# Patient Record
Sex: Female | Born: 1985 | Race: White | Hispanic: No | Marital: Married | Smoking: Current every day smoker
Health system: Southern US, Community
[De-identification: ages and names within clinical notes are randomized; demographics above are authoritative.]

## PROBLEM LIST (undated history)

## (undated) DIAGNOSIS — M797 Fibromyalgia: Secondary | ICD-10-CM

## (undated) DIAGNOSIS — N281 Cyst of kidney, acquired: Secondary | ICD-10-CM

## (undated) DIAGNOSIS — I10 Essential (primary) hypertension: Secondary | ICD-10-CM

## (undated) DIAGNOSIS — R51 Headache: Secondary | ICD-10-CM

## (undated) DIAGNOSIS — K219 Gastro-esophageal reflux disease without esophagitis: Secondary | ICD-10-CM

## (undated) DIAGNOSIS — R319 Hematuria, unspecified: Secondary | ICD-10-CM

## (undated) DIAGNOSIS — L732 Hidradenitis suppurativa: Principal | ICD-10-CM

## (undated) DIAGNOSIS — M1990 Unspecified osteoarthritis, unspecified site: Secondary | ICD-10-CM

## (undated) HISTORY — PX: TONSILLECTOMY: SUR1361

## (undated) HISTORY — DX: Hidradenitis suppurativa: L73.2

---
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
## 2005-01-05 HISTORY — PX: EAR CYST EXCISION: SHX22

## 2005-03-24 ENCOUNTER — Emergency Department (HOSPITAL_COMMUNITY): Admission: EM | Admit: 2005-03-24 | Discharge: 2005-03-24 | Payer: Self-pay | Admitting: Emergency Medicine

## 2005-07-07 ENCOUNTER — Ambulatory Visit: Payer: Self-pay | Admitting: Family Medicine

## 2006-05-05 ENCOUNTER — Encounter: Admission: RE | Admit: 2006-05-05 | Discharge: 2006-05-05 | Payer: Self-pay | Admitting: Family Medicine

## 2006-08-09 ENCOUNTER — Inpatient Hospital Stay (HOSPITAL_COMMUNITY): Admission: AD | Admit: 2006-08-09 | Discharge: 2006-08-09 | Payer: Self-pay | Admitting: Gynecology

## 2006-08-10 ENCOUNTER — Inpatient Hospital Stay (HOSPITAL_COMMUNITY): Admission: AD | Admit: 2006-08-10 | Discharge: 2006-08-10 | Payer: Self-pay | Admitting: Obstetrics and Gynecology

## 2006-08-24 ENCOUNTER — Ambulatory Visit (HOSPITAL_COMMUNITY): Admission: AD | Admit: 2006-08-24 | Discharge: 2006-08-25 | Payer: Self-pay | Admitting: Obstetrics & Gynecology

## 2006-08-24 ENCOUNTER — Encounter: Payer: Self-pay | Admitting: Obstetrics & Gynecology

## 2006-08-25 HISTORY — PX: SALPINGECTOMY: SHX328

## 2007-01-06 HISTORY — PX: TUMOR REMOVAL: SHX12

## 2007-11-06 ENCOUNTER — Inpatient Hospital Stay (HOSPITAL_COMMUNITY): Admission: AD | Admit: 2007-11-06 | Discharge: 2007-11-06 | Payer: Self-pay | Admitting: Obstetrics & Gynecology

## 2007-12-13 ENCOUNTER — Ambulatory Visit (HOSPITAL_COMMUNITY): Admission: RE | Admit: 2007-12-13 | Discharge: 2007-12-13 | Payer: Self-pay | Admitting: Obstetrics & Gynecology

## 2008-01-06 HISTORY — PX: TUMOR REMOVAL: SHX12

## 2008-01-09 ENCOUNTER — Ambulatory Visit (HOSPITAL_COMMUNITY): Admission: RE | Admit: 2008-01-09 | Discharge: 2008-01-09 | Payer: Self-pay | Admitting: Obstetrics & Gynecology

## 2008-01-20 ENCOUNTER — Ambulatory Visit (HOSPITAL_COMMUNITY): Admission: RE | Admit: 2008-01-20 | Discharge: 2008-01-20 | Payer: Self-pay | Admitting: Obstetrics & Gynecology

## 2008-01-30 ENCOUNTER — Inpatient Hospital Stay (HOSPITAL_COMMUNITY): Admission: AD | Admit: 2008-01-30 | Discharge: 2008-01-30 | Payer: Self-pay | Admitting: Obstetrics

## 2008-02-08 ENCOUNTER — Encounter: Admission: RE | Admit: 2008-02-08 | Discharge: 2008-05-08 | Payer: Self-pay | Admitting: Obstetrics & Gynecology

## 2008-03-02 ENCOUNTER — Ambulatory Visit (HOSPITAL_COMMUNITY): Admission: RE | Admit: 2008-03-02 | Discharge: 2008-03-02 | Payer: Self-pay | Admitting: Obstetrics & Gynecology

## 2008-04-13 ENCOUNTER — Ambulatory Visit (HOSPITAL_COMMUNITY): Admission: RE | Admit: 2008-04-13 | Discharge: 2008-04-13 | Payer: Self-pay | Admitting: Obstetrics & Gynecology

## 2008-05-02 ENCOUNTER — Inpatient Hospital Stay (HOSPITAL_COMMUNITY): Admission: AD | Admit: 2008-05-02 | Discharge: 2008-05-02 | Payer: Self-pay | Admitting: Obstetrics

## 2008-05-11 ENCOUNTER — Ambulatory Visit (HOSPITAL_COMMUNITY): Admission: RE | Admit: 2008-05-11 | Discharge: 2008-05-11 | Payer: Self-pay | Admitting: Obstetrics & Gynecology

## 2008-05-17 ENCOUNTER — Ambulatory Visit (HOSPITAL_COMMUNITY): Admission: RE | Admit: 2008-05-17 | Discharge: 2008-05-17 | Payer: Self-pay | Admitting: Obstetrics & Gynecology

## 2008-05-31 ENCOUNTER — Ambulatory Visit (HOSPITAL_COMMUNITY): Admission: RE | Admit: 2008-05-31 | Discharge: 2008-05-31 | Payer: Self-pay | Admitting: Obstetrics & Gynecology

## 2008-06-07 ENCOUNTER — Ambulatory Visit (HOSPITAL_COMMUNITY): Admission: RE | Admit: 2008-06-07 | Discharge: 2008-06-07 | Payer: Self-pay | Admitting: Obstetrics & Gynecology

## 2008-06-12 ENCOUNTER — Inpatient Hospital Stay (HOSPITAL_COMMUNITY): Admission: AD | Admit: 2008-06-12 | Discharge: 2008-06-12 | Payer: Self-pay | Admitting: Obstetrics & Gynecology

## 2008-06-14 ENCOUNTER — Inpatient Hospital Stay (HOSPITAL_COMMUNITY): Admission: AD | Admit: 2008-06-14 | Discharge: 2008-06-15 | Payer: Self-pay | Admitting: Obstetrics & Gynecology

## 2008-10-25 ENCOUNTER — Emergency Department (HOSPITAL_COMMUNITY): Admission: EM | Admit: 2008-10-25 | Discharge: 2008-10-25 | Payer: Self-pay | Admitting: Family Medicine

## 2009-01-05 HISTORY — PX: TUMOR REMOVAL: SHX12

## 2009-03-01 ENCOUNTER — Ambulatory Visit (HOSPITAL_BASED_OUTPATIENT_CLINIC_OR_DEPARTMENT_OTHER): Admission: RE | Admit: 2009-03-01 | Discharge: 2009-03-01 | Payer: Self-pay | Admitting: Surgery

## 2009-03-01 HISTORY — PX: IRRIGATION AND DEBRIDEMENT SEBACEOUS CYST: SHX5255

## 2009-03-08 ENCOUNTER — Encounter: Admission: RE | Admit: 2009-03-08 | Discharge: 2009-03-08 | Payer: Self-pay | Admitting: Podiatry

## 2009-11-20 ENCOUNTER — Encounter: Payer: Self-pay | Admitting: Cardiology

## 2009-11-21 ENCOUNTER — Encounter: Payer: Self-pay | Admitting: Cardiology

## 2009-11-22 DIAGNOSIS — F329 Major depressive disorder, single episode, unspecified: Secondary | ICD-10-CM

## 2009-11-22 DIAGNOSIS — R450 Nervousness: Secondary | ICD-10-CM | POA: Insufficient documentation

## 2009-11-22 DIAGNOSIS — R42 Dizziness and giddiness: Secondary | ICD-10-CM

## 2009-11-22 DIAGNOSIS — R55 Syncope and collapse: Secondary | ICD-10-CM

## 2009-11-22 DIAGNOSIS — K219 Gastro-esophageal reflux disease without esophagitis: Secondary | ICD-10-CM

## 2009-11-22 DIAGNOSIS — R002 Palpitations: Secondary | ICD-10-CM

## 2009-11-22 DIAGNOSIS — R112 Nausea with vomiting, unspecified: Secondary | ICD-10-CM | POA: Insufficient documentation

## 2009-11-22 DIAGNOSIS — R011 Cardiac murmur, unspecified: Secondary | ICD-10-CM

## 2009-11-22 DIAGNOSIS — O200 Threatened abortion: Secondary | ICD-10-CM | POA: Insufficient documentation

## 2009-11-27 ENCOUNTER — Ambulatory Visit: Payer: Self-pay | Admitting: Cardiology

## 2009-11-27 ENCOUNTER — Encounter: Payer: Self-pay | Admitting: Cardiology

## 2009-12-05 LAB — CONVERTED CEMR LAB
BUN: 8 mg/dL (ref 6–23)
Calcium: 9.4 mg/dL (ref 8.4–10.5)
Glucose, Bld: 49 mg/dL — ABNORMAL LOW (ref 70–99)
Magnesium: 2 mg/dL (ref 1.5–2.5)
Sodium: 140 meq/L (ref 135–145)

## 2009-12-11 ENCOUNTER — Inpatient Hospital Stay (HOSPITAL_COMMUNITY)
Admission: AD | Admit: 2009-12-11 | Discharge: 2009-12-12 | Payer: Self-pay | Source: Home / Self Care | Admitting: Obstetrics & Gynecology

## 2010-01-26 ENCOUNTER — Encounter: Payer: Self-pay | Admitting: Family Medicine

## 2010-01-27 ENCOUNTER — Encounter: Payer: Self-pay | Admitting: Obstetrics & Gynecology

## 2010-02-02 ENCOUNTER — Inpatient Hospital Stay (HOSPITAL_COMMUNITY)
Admission: AD | Admit: 2010-02-02 | Discharge: 2010-02-02 | Payer: Self-pay | Source: Home / Self Care | Attending: Obstetrics | Admitting: Obstetrics

## 2010-02-04 NOTE — Progress Notes (Signed)
Summary: [facility] Office Visit  [facility] Office Visit   Imported By: Earl Many 12/05/2009 15:49:57  _____________________________________________________________________  External Attachment:    Type:   Image     Comment:   External Document

## 2010-02-04 NOTE — Progress Notes (Signed)
Summary: [facility]  [facility]   Imported By: Earl Many 12/05/2009 15:47:45  _____________________________________________________________________  External Attachment:    Type:   Image     Comment:   External Document

## 2010-02-04 NOTE — Consult Note (Signed)
Summary: [facility] Consultation Report  [facility] Consultation Report   Imported By: Earl Many 12/05/2009 15:51:02  _____________________________________________________________________  External Attachment:    Type:   Image     Comment:   External Document

## 2010-02-04 NOTE — Assessment & Plan Note (Signed)
Summary: np6/dx:palpitations/early pregnacy not sure how many weeks/lg   Visit Type:  new pt visit Referring Provider:  Dr. Tamela Oddi Primary Provider:  Dr. Daphine Deutscher...[facility] Kitchen[facility]  CC:  palpitations...sob at times....chest discomfort/pressure...denies any edema...pt is [redacted] wks gestation.  History of Present Illness: [name] comes in today for evaluation of intermittent palpitations. We are asked to see her by Dr. Tamela Oddi.   She is [redacted] weeks pregnant. She is 25 years of age. She's been having intermittent palpitations that lasted for a few seconds. They occur spontaneously. They're not associated with exertion. She denies any other symptoms and denies any presyncope or syncope.  She had it evaluated by [place] in 2006. At that time, a monitor showed PACs but no other dysrhythmia. Echocardiogram was normal. She was encouraged to not smoke and to discontinue caffeine. She still smokes 3-4 cigarettes a day but does not use caffeine. At the time of this evaluation, she was pregnant as well.   She denies orthopnea, PND or edema.  Current Medications (verified): 1)  Tamiflu 45 Mg Caps (Oseltamivir Phosphate) .Marland Kitchen.. 1 Tab Two Times A Day  Allergies (verified): No Known Drug Allergies  Past History:  Past Medical History: Last updated: 11/22/2009 TRICUSPID REGURGITATION, MODERATE (ICD-397.0) PALPITATIONS (ICD-785.1) CARDIAC MURMUR (ICD-785.2) SYNCOPE AND COLLAPSE (ICD-780.2) THREATENED ABORTION, ANTEPARTUM (ICD-640.03) NERVOUSNESS (ICD-799.21) NAUSEA AND VOMITING (ICD-787.01) DIZZINESS (ICD-780.4) ACID REFLUX DISEASE (ICD-530.81) DEPRESSION (ICD-311)  Past Surgical History: Last updated: 11/22/2009 Excision of four sebaceous cysts from both groins all  measured about 1 cm.  SURGEON:  Wilmon Arms. Tsuei, MD  Laparoscopic right salpingectomy. SURGEON:  Tamela Oddi, M.D.  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  Alcohol Use - no Drug Use - no  Risk Factors: Smoking Status: quit (11/22/2009)  Review of Systems       negative other than history of present illness  Vital Signs:  Patient profile:   25 year old female Height:      67 inches Weight:      196 pounds BMI:     30.81 Pulse rate:   82 / minute Pulse rhythm:   normal BP sitting:   94 / 62  (left arm) Cuff size:   large  Vitals Entered By: Danielle Rankin, CMA (November 27, 2009 10:53 AM)  Physical Exam  General:  obese.   Head:  normocephalic and atraumatic Eyes:  PERRLA/EOM intact; conjunctiva and lids normal. Neck:  Neck supple, no JVD. No masses, thyromegaly or abnormal cervical nodes. Chest Annalea Alguire:  no deformities or breast masses noted Lungs:  Clear bilaterally to auscultation and percussion. Heart:  PMI difficult to appreciate, normal S1-S2, very soft systolic murmur lower left sternal border, normal S2, no right ventricular lift, carotid shows equal bilateral bruits. Abdomen:  father, positive bowel sounds, Msk:  Back normal, normal gait. Muscle strength and tone normal. Pulses:  pulses normal in all 4 extremities Extremities:  No clubbing or cyanosis. Neurologic:  Alert and oriented x 3. Skin:  Intact without lesions or rashes. Psych:  Normal affect.   Impression & Recommendations:  Problem # 1:  PALPITATIONS (ICD-785.1) Assessment Deteriorated I suspect these are again PACs. By history and exam and previous evaluation there certainly benign. Will check a TSH, potassium and magnesium level. Reassurance is given. We'll see her back p.r.n. Orders: EKG w/ Interpretation (93000) TLB-Magnesium (  Mg) (83735-MG) TLB-TSH (Thyroid Stimulating Hormone) (84443-TSH) TLB-BMP (Basic Metabolic Panel-BMET) (80048-METABOL)  Problem # 2:  CARDIAC MURMUR  (ICD-785.2) Assessment: Unchanged benign flow murmur  Patient Instructions: 1)  Your physician recommends that you schedule a follow-up appointment in: as needed with Dr. Daleen Squibb 2)  Your physician recommends that you have  lab work today: 3)  Your physician recommends that you continue on your current medications as directed. Please refer to the Current Medication list given to you today.  Appended Document: Orders Update    Clinical Lists Changes  Orders: Added new Test order of T- * Misc. Laboratory test [phone]) - Signed

## 2010-03-17 LAB — WET PREP, GENITAL
Trich, Wet Prep: NONE SEEN
Yeast Wet Prep HPF POC: NONE SEEN

## 2010-03-17 LAB — URINALYSIS, ROUTINE W REFLEX MICROSCOPIC
Glucose, UA: NEGATIVE mg/dL
Ketones, ur: NEGATIVE mg/dL
Protein, ur: 30 mg/dL — AB

## 2010-03-17 LAB — URINE MICROSCOPIC-ADD ON

## 2010-03-17 LAB — URINE CULTURE

## 2010-03-26 LAB — POCT HEMOGLOBIN-HEMACUE: Hemoglobin: 14.2 g/dL (ref 12.0–15.0)

## 2010-04-08 ENCOUNTER — Other Ambulatory Visit: Payer: Self-pay | Admitting: Obstetrics & Gynecology

## 2010-04-11 ENCOUNTER — Ambulatory Visit (HOSPITAL_COMMUNITY)
Admission: RE | Admit: 2010-04-11 | Discharge: 2010-04-11 | Disposition: A | Discharge status: Home / Self Care | Payer: Medicaid Other | Source: Ambulatory Visit | Attending: Obstetrics & Gynecology | Admitting: Obstetrics & Gynecology

## 2010-04-11 DIAGNOSIS — O99891 Other specified diseases and conditions complicating pregnancy: Secondary | ICD-10-CM | POA: Insufficient documentation

## 2010-04-11 DIAGNOSIS — R109 Unspecified abdominal pain: Secondary | ICD-10-CM | POA: Insufficient documentation

## 2010-04-11 DIAGNOSIS — R319 Hematuria, unspecified: Secondary | ICD-10-CM | POA: Insufficient documentation

## 2010-04-14 LAB — CBC
HCT: 30.8 % — ABNORMAL LOW (ref 36.0–46.0)
HCT: 34.3 % — ABNORMAL LOW (ref 36.0–46.0)
Hemoglobin: 10.7 g/dL — ABNORMAL LOW (ref 12.0–15.0)
Hemoglobin: 12 g/dL (ref 12.0–15.0)
MCHC: 35 g/dL (ref 30.0–36.0)
MCV: 85.8 fL (ref 78.0–100.0)
MCV: 87 fL (ref 78.0–100.0)
Platelets: 147 10*3/uL — ABNORMAL LOW (ref 150–400)
Platelets: 177 10*3/uL (ref 150–400)
RBC: 3.99 MIL/uL (ref 3.87–5.11)
RDW: 14.4 % (ref 11.5–15.5)
RDW: 14.5 % (ref 11.5–15.5)
WBC: 13.2 10*3/uL — ABNORMAL HIGH (ref 4.0–10.5)

## 2010-04-14 LAB — DIFFERENTIAL
Eosinophils Absolute: 0.1 10*3/uL (ref 0.0–0.7)
Eosinophils Relative: 1 % (ref 0–5)
Lymphocytes Relative: 24 % (ref 12–46)
Lymphs Abs: 3.2 10*3/uL (ref 0.7–4.0)
Monocytes Relative: 4 % (ref 3–12)

## 2010-04-14 LAB — RPR: RPR Ser Ql: NONREACTIVE

## 2010-04-21 LAB — URINALYSIS, ROUTINE W REFLEX MICROSCOPIC
Bilirubin Urine: NEGATIVE
Ketones, ur: 15 mg/dL — AB
Nitrite: NEGATIVE
Specific Gravity, Urine: 1.02 (ref 1.005–1.030)
Urobilinogen, UA: 0.2 mg/dL (ref 0.0–1.0)
pH: 6 (ref 5.0–8.0)

## 2010-04-21 LAB — URINE MICROSCOPIC-ADD ON

## 2010-05-20 NOTE — Op Note (Signed)
Claire Caldwell, Claire Caldwell NO.:  [id number]   MEDICAL RECORD NO.:  [id number]          PATIENT TYPE:  INP   LOCATION:  9309                          FACILITY:  [facility]   PHYSICIAN:  Roseanna Rainbow, M.D.DATE OF BIRTH:  1985-01-24   DATE OF PROCEDURE:  08/25/2006  DATE OF DISCHARGE:                               OPERATIVE REPORT   PREOPERATIVE DIAGNOSIS:  Rule out leaking right tubal ectopic pregnancy.   POSTOPERATIVE DIAGNOSIS:  Ruptured right tubal ectopic pregnancy.   PROCEDURE:  Laparoscopic right salpingectomy.   SURGEON:  Tamela Oddi, M.D.   ANESTHESIA:  General.   PATHOLOGY:  Right fallopian tube with products of conception.   ESTIMATED BLOOD LOSS:  Minimal.   COMPLICATIONS:  None.   FINDINGS:  At laparoscopy there was approximately 100 mL of  hemoperitoneum. The right fallopian tube was distended with both  products of conception and blood from the isthmic portion of the tube  out to the fimbria.  The distal portion of the tube was encased in blood  clots.  The remainder of the pelvic anatomy was normal appearing.   PROCEDURE:  The patient is taken to the operating room with an IV  running.  General anesthesia was induced.  The patient was placed in the  dorsal lithotomy position and prepped and draped in the usual sterile  fashion.  After a time-out was performed.  A bivalve speculum was placed  in the patient's vagina.  The anterior lip of the cervix was grasped  with single-tooth tenaculum.  A Hulka uterine manipulator was then  advanced into the uterus and secured to the anterior lip of the cervix  as a means to manipulate the uterus.  The single-tooth tenaculum and  speculum was then removed.  An infraumbilical skin incision was then  made with a scalpel.  The Veress needle was then introduced into the  abdomen in a 45 degrees angle while tenting up the anterior abdominal  wall.  Intra-abdominal placement was confirmed by saline drop test  and  appropriate low pressure reading upon insufflation of the abdomen with  CO2 gas.  The abdomen was insufflated with 4 liters of CO2 gas.  The  Veress needle was then removed.  The trocar and sleeve were then  advanced into the abdomen where intra-abdominal placement was confirmed  with the laparoscope.  Visual survey of the pelvic viscera revealed the  above findings.  A suprapubic trocar and sleeve as well as a right lower  quadrant trocar and sleeve were then advanced into the abdomen under  direct visualization.  The distal end of the fallopian tube was then  grasped and the mesosalpinx was then cauterized and transected using in  the gyrus.  The distal portion of the tube was then cauterized and  transected with the gyrus.  Adequate hemostasis was noted.  The  hemoperitoneum in the pelvis was then suctioned with the Nezhat.  The  fallopian tube was then placed into an EndoCatch and the specimen was  then removed from the abdomen.  The fascial incision for the suprapubic  port was reapproximated with  interrupted sutures of 2-0 Vicryl.  The  skin incisions were then  reapproximated with interrupted sutures of 3-0 Monocryl and Dermabond.  The Hulka manipulator was then removed from the uterus and cervix with  minimal bleeding noted.  At the close of the procedure the instrument  pack counts were said to be correct x2.  The patient was taken to the  PACU awake and in stable condition.      Roseanna Rainbow, M.D.  Electronically Signed     LAJ/MEDQ  D:  08/25/2006  T:  08/25/2006  Job:  [id number]

## 2010-06-24 ENCOUNTER — Other Ambulatory Visit: Payer: Self-pay | Admitting: Obstetrics & Gynecology

## 2010-06-24 DIAGNOSIS — R109 Unspecified abdominal pain: Secondary | ICD-10-CM

## 2010-06-30 ENCOUNTER — Ambulatory Visit (HOSPITAL_COMMUNITY)
Admission: RE | Admit: 2010-06-30 | Discharge: 2010-06-30 | Disposition: A | Discharge status: Home / Self Care | Payer: Medicaid Other | Source: Ambulatory Visit | Attending: Obstetrics & Gynecology | Admitting: Obstetrics & Gynecology

## 2010-06-30 ENCOUNTER — Encounter (HOSPITAL_COMMUNITY): Payer: Self-pay

## 2010-06-30 DIAGNOSIS — O99891 Other specified diseases and conditions complicating pregnancy: Secondary | ICD-10-CM | POA: Insufficient documentation

## 2010-06-30 DIAGNOSIS — R109 Unspecified abdominal pain: Secondary | ICD-10-CM | POA: Insufficient documentation

## 2010-07-04 ENCOUNTER — Inpatient Hospital Stay (HOSPITAL_COMMUNITY)
Admission: AD | Admit: 2010-07-04 | Discharge: 2010-07-04 | Disposition: A | Discharge status: Home / Self Care | Payer: Medicaid Other | Source: Ambulatory Visit | Attending: Obstetrics | Admitting: Obstetrics

## 2010-07-04 DIAGNOSIS — R109 Unspecified abdominal pain: Secondary | ICD-10-CM

## 2010-07-04 DIAGNOSIS — O212 Late vomiting of pregnancy: Secondary | ICD-10-CM | POA: Insufficient documentation

## 2010-07-04 DIAGNOSIS — R197 Diarrhea, unspecified: Secondary | ICD-10-CM | POA: Insufficient documentation

## 2010-07-04 DIAGNOSIS — O9989 Other specified diseases and conditions complicating pregnancy, childbirth and the puerperium: Secondary | ICD-10-CM

## 2010-07-04 DIAGNOSIS — O99891 Other specified diseases and conditions complicating pregnancy: Secondary | ICD-10-CM | POA: Insufficient documentation

## 2010-07-04 LAB — URINALYSIS, ROUTINE W REFLEX MICROSCOPIC
Leukocytes, UA: NEGATIVE
Nitrite: NEGATIVE
Protein, ur: 30 mg/dL — AB
Specific Gravity, Urine: >1.03 — ABNORMAL HIGH (ref 1.005–1.030)
Urobilinogen, UA: 0.2 mg/dL (ref 0.0–1.0)

## 2010-07-04 LAB — URINE MICROSCOPIC-ADD ON

## 2010-07-04 LAB — CBC
HCT: 33.6 % — ABNORMAL LOW (ref 36.0–46.0)
Hemoglobin: 11.1 g/dL — ABNORMAL LOW (ref 12.0–15.0)
MCH: 28.7 pg (ref 26.0–34.0)
MCHC: 33 g/dL (ref 30.0–36.0)
MCV: 86.8 fL (ref 78.0–100.0)
RDW: 13.9 % (ref 11.5–15.5)

## 2010-07-06 LAB — URINE CULTURE

## 2010-07-08 ENCOUNTER — Inpatient Hospital Stay (HOSPITAL_COMMUNITY)
Admission: AD | Admit: 2010-07-08 | Discharge: 2010-07-10 | DRG: 775 | Disposition: A | Discharge status: Home / Self Care | Payer: Medicaid Other | Source: Ambulatory Visit | Attending: Obstetrics | Admitting: Obstetrics

## 2010-07-08 ENCOUNTER — Inpatient Hospital Stay (HOSPITAL_COMMUNITY): Admission: AD | Admit: 2010-07-08 | Payer: Medicaid Other | Source: Ambulatory Visit | Admitting: Obstetrics

## 2010-07-08 ENCOUNTER — Inpatient Hospital Stay (HOSPITAL_COMMUNITY): Payer: Medicaid Other | Admitting: Obstetrics

## 2010-07-08 ENCOUNTER — Inpatient Hospital Stay (HOSPITAL_COMMUNITY)
Admission: AD | Admit: 2010-07-08 | Discharge: 2010-07-08 | Disposition: A | Discharge status: Home / Self Care | Payer: Medicaid Other | Source: Ambulatory Visit | Attending: Obstetrics | Admitting: Obstetrics

## 2010-07-08 DIAGNOSIS — O479 False labor, unspecified: Secondary | ICD-10-CM | POA: Insufficient documentation

## 2010-07-08 LAB — CBC
Hemoglobin: 11.2 g/dL — ABNORMAL LOW (ref 12.0–15.0)
MCHC: 32.8 g/dL (ref 30.0–36.0)
RBC: 3.95 MIL/uL (ref 3.87–5.11)
WBC: 15.7 10*3/uL — ABNORMAL HIGH (ref 4.0–10.5)

## 2010-07-09 LAB — CBC
Hemoglobin: 9.2 g/dL — ABNORMAL LOW (ref 12.0–15.0)
MCHC: 32.7 g/dL (ref 30.0–36.0)
WBC: 17.4 10*3/uL — ABNORMAL HIGH (ref 4.0–10.5)

## 2010-07-09 LAB — RPR: RPR Ser Ql: NONREACTIVE

## 2010-07-09 LAB — ABO/RH: ABO/RH(D): O POS

## 2010-08-20 ENCOUNTER — Other Ambulatory Visit: Payer: Self-pay | Admitting: Obstetrics & Gynecology

## 2010-08-21 ENCOUNTER — Encounter (HOSPITAL_COMMUNITY): Payer: Self-pay | Admitting: *Deleted

## 2010-08-21 NOTE — Progress Notes (Signed)
Addended byTamela Oddi MD, Alaa Mullally on: 08/21/2010 05:41 PM   Modules accepted: Orders

## 2010-08-24 NOTE — Progress Notes (Signed)
Addended by: [name] MD, Binnie Vonderhaar on: 08/24/2010 08:07 PM   Modules accepted: Orders  

## 2010-08-24 NOTE — Progress Notes (Signed)
Addended byTamela Oddi MD, Tailey Top on: 08/24/2010 08:07 PM   Modules accepted: Orders

## 2010-08-24 NOTE — Progress Notes (Signed)
Addended byTamela Oddi MD, Eun Vermeer on: 08/24/2010 08:08 PM   Modules accepted: Orders

## 2010-08-27 ENCOUNTER — Other Ambulatory Visit: Payer: Self-pay | Admitting: Nephrology

## 2010-08-27 DIAGNOSIS — R809 Proteinuria, unspecified: Secondary | ICD-10-CM

## 2010-08-28 ENCOUNTER — Ambulatory Visit
Admission: RE | Admit: 2010-08-28 | Discharge: 2010-08-28 | Disposition: A | Discharge status: Home / Self Care | Payer: Medicaid Other | Source: Ambulatory Visit | Attending: Nephrology | Admitting: Nephrology

## 2010-08-28 DIAGNOSIS — R809 Proteinuria, unspecified: Secondary | ICD-10-CM

## 2010-08-29 ENCOUNTER — Other Ambulatory Visit: Payer: Medicaid Other

## 2010-09-04 ENCOUNTER — Other Ambulatory Visit: Payer: Self-pay | Admitting: Obstetrics & Gynecology

## 2010-09-05 ENCOUNTER — Ambulatory Visit (HOSPITAL_COMMUNITY): Payer: Medicaid Other | Admitting: Certified Registered Nurse Anesthetist

## 2010-09-05 ENCOUNTER — Encounter (HOSPITAL_COMMUNITY)
Admission: RE | Disposition: A | Discharge status: Home / Self Care | Payer: Self-pay | Source: Ambulatory Visit | Attending: Obstetrics & Gynecology

## 2010-09-05 ENCOUNTER — Ambulatory Visit (HOSPITAL_COMMUNITY)
Admission: RE | Admit: 2010-09-05 | Discharge: 2010-09-05 | Disposition: A | Discharge status: Home / Self Care | Payer: Medicaid Other | Source: Ambulatory Visit | Attending: Obstetrics & Gynecology | Admitting: Obstetrics & Gynecology

## 2010-09-05 ENCOUNTER — Encounter (HOSPITAL_COMMUNITY): Payer: Self-pay | Admitting: Certified Registered Nurse Anesthetist

## 2010-09-05 DIAGNOSIS — R002 Palpitations: Secondary | ICD-10-CM

## 2010-09-05 DIAGNOSIS — R112 Nausea with vomiting, unspecified: Secondary | ICD-10-CM

## 2010-09-05 DIAGNOSIS — K219 Gastro-esophageal reflux disease without esophagitis: Secondary | ICD-10-CM

## 2010-09-05 DIAGNOSIS — R450 Nervousness: Secondary | ICD-10-CM

## 2010-09-05 DIAGNOSIS — F329 Major depressive disorder, single episode, unspecified: Secondary | ICD-10-CM

## 2010-09-05 DIAGNOSIS — Z641 Problems related to multiparity: Secondary | ICD-10-CM | POA: Insufficient documentation

## 2010-09-05 DIAGNOSIS — R55 Syncope and collapse: Secondary | ICD-10-CM

## 2010-09-05 DIAGNOSIS — R42 Dizziness and giddiness: Secondary | ICD-10-CM

## 2010-09-05 DIAGNOSIS — F3289 Other specified depressive episodes: Secondary | ICD-10-CM

## 2010-09-05 DIAGNOSIS — Z302 Encounter for sterilization: Secondary | ICD-10-CM

## 2010-09-05 DIAGNOSIS — R011 Cardiac murmur, unspecified: Secondary | ICD-10-CM

## 2010-09-05 DIAGNOSIS — O200 Threatened abortion: Secondary | ICD-10-CM

## 2010-09-05 HISTORY — DX: Gastro-esophageal reflux disease without esophagitis: K21.9

## 2010-09-05 HISTORY — PX: TUBAL LIGATION: SHX77

## 2010-09-05 HISTORY — DX: Headache: R51

## 2010-09-05 LAB — CBC
Platelets: 301 10*3/uL (ref 150–400)
RBC: 4.51 MIL/uL (ref 3.87–5.11)
RDW: 13.8 % (ref 11.5–15.5)
WBC: 12.4 10*3/uL — ABNORMAL HIGH (ref 4.0–10.5)

## 2010-09-05 SURGERY — ESSURE TUBAL STERILIZATION
Anesthesia: General | Site: Vagina | Laterality: Left | Wound class: Clean Contaminated

## 2010-09-05 MED ORDER — LIDOCAINE HCL (CARDIAC) 20 MG/ML IV SOLN
INTRAVENOUS | Status: DC | PRN
  Administered 2010-09-05: 30 mg via INTRAVENOUS

## 2010-09-05 MED ORDER — KETOROLAC TROMETHAMINE 30 MG/ML IJ SOLN
INTRAMUSCULAR | Status: AC
  Filled 2010-09-05: qty 1

## 2010-09-05 MED ORDER — DEXAMETHASONE SODIUM PHOSPHATE 4 MG/ML IJ SOLN
INTRAMUSCULAR | Status: DC | PRN
  Administered 2010-09-05: 10 mg via INTRAVENOUS

## 2010-09-05 MED ORDER — PROPOFOL 10 MG/ML IV EMUL
INTRAVENOUS | Status: DC | PRN
  Administered 2010-09-05: 160 mg via INTRAVENOUS

## 2010-09-05 MED ORDER — MIDAZOLAM HCL 5 MG/5ML IJ SOLN
INTRAMUSCULAR | Status: DC | PRN
  Administered 2010-09-05: 2 mg via INTRAVENOUS

## 2010-09-05 MED ORDER — HYDROCODONE-ACETAMINOPHEN 5-325 MG PO TABS
ORAL_TABLET | ORAL | Status: AC
  Filled 2010-09-05: qty 2

## 2010-09-05 MED ORDER — KETOROLAC TROMETHAMINE 30 MG/ML IJ SOLN
INTRAMUSCULAR | Status: DC | PRN
  Administered 2010-09-05: 30 mg via INTRAVENOUS

## 2010-09-05 MED ORDER — ONDANSETRON HCL 4 MG/2ML IJ SOLN
INTRAMUSCULAR | Status: AC
  Filled 2010-09-05: qty 2

## 2010-09-05 MED ORDER — FENTANYL CITRATE 0.05 MG/ML IJ SOLN
INTRAMUSCULAR | Status: AC
  Filled 2010-09-05: qty 2

## 2010-09-05 MED ORDER — HYDROCODONE-ACETAMINOPHEN 5-325 MG PO TABS
2.0000 | ORAL_TABLET | Freq: Once | ORAL | Status: AC
  Administered 2010-09-05: 2 via ORAL

## 2010-09-05 MED ORDER — LACTATED RINGERS IV SOLN
INTRAVENOUS | Status: DC
  Administered 2010-09-05 (×2): via INTRAVENOUS

## 2010-09-05 MED ORDER — LIDOCAINE HCL 1 % IJ SOLN
INTRAMUSCULAR | Status: DC | PRN
  Administered 2010-09-05: 10 mL

## 2010-09-05 MED ORDER — FENTANYL CITRATE 0.05 MG/ML IJ SOLN
INTRAMUSCULAR | Status: DC | PRN
  Administered 2010-09-05 (×2): 50 ug via INTRAVENOUS

## 2010-09-05 MED ORDER — MIDAZOLAM HCL 2 MG/2ML IJ SOLN
INTRAMUSCULAR | Status: AC
  Filled 2010-09-05: qty 2

## 2010-09-05 MED ORDER — LACTATED RINGERS IR SOLN
Status: DC | PRN
  Administered 2010-09-05: 3000 mL

## 2010-09-05 MED ORDER — LIDOCAINE HCL (CARDIAC) 20 MG/ML IV SOLN
INTRAVENOUS | Status: AC
  Filled 2010-09-05: qty 5

## 2010-09-05 MED ORDER — PROPOFOL 10 MG/ML IV EMUL
INTRAVENOUS | Status: AC
  Filled 2010-09-05: qty 20

## 2010-09-05 MED ORDER — IBUPROFEN 800 MG PO TABS: 800.0000 mg | ORAL_TABLET | Freq: Three times a day (TID) | ORAL | Status: AC | PRN

## 2010-09-05 MED ORDER — DEXAMETHASONE SODIUM PHOSPHATE 10 MG/ML IJ SOLN
INTRAMUSCULAR | Status: AC
  Filled 2010-09-05: qty 1

## 2010-09-05 MED ORDER — KETOROLAC TROMETHAMINE 30 MG/ML IJ SOLN
INTRAMUSCULAR | Status: AC
  Administered 2010-09-05: 30 mg
  Filled 2010-09-05: qty 1

## 2010-09-05 MED ORDER — ONDANSETRON HCL 4 MG/2ML IJ SOLN
INTRAMUSCULAR | Status: DC | PRN
  Administered 2010-09-05: 4 mg via INTRAVENOUS

## 2010-09-05 SURGICAL SUPPLY — 10 items
CATH ROBINSON RED A/P 16FR (CATHETERS) ×2 IMPLANT
CLOTH BEACON ORANGE TIMEOUT ST (SAFETY) ×2 IMPLANT
CONTAINER PREFILL 10% NBF 60ML (FORM) IMPLANT
DRAPE UTILITY XL STRL (DRAPES) ×2 IMPLANT
GLOVE BIO SURGEON STRL SZ 6.5 (GLOVE) ×4 IMPLANT
GOWN PREVENTION PLUS LG XLONG (DISPOSABLE) ×4 IMPLANT
KIT ESSURE FALLOPIAN CLOSURE (Ring) ×2 IMPLANT
PACK HYSTEROSCOPY LF (CUSTOM PROCEDURE TRAY) ×2 IMPLANT
TOWEL OR 17X24 6PK STRL BLUE (TOWEL DISPOSABLE) ×4 IMPLANT
WATER STERILE IRR 1000ML POUR (IV SOLUTION) ×2 IMPLANT

## 2010-09-05 NOTE — Op Note (Signed)
Preoperative diagnosis: Multiparity, desires sterilization  Postoperative diagnosis: Same  Procedure: Hysteroscopy, Essure tubal occlusion Surgeon: Antionette Char A  Anesthesia: LMA, paracervical block  Estimated blood loss: Minimal  Urine output: 100 ml  IV Fluids:  per Anesthesiology  Complications: None  Specimen: N/A  Operative Findings: Normal endometrial cavity.  Description of procedure:   The patient was taken to the operating room and placed on the operating table in the semi-lithotomy position in Farwell stirrups.  Examination under anesthesia was performed.  The patient was prepped and draped in the usual manner.  After a time-out had been completed, a speculum was placed in the vagina.  The anterior lip of the cervix was grasped with a single-toothed tenaculum.  A paracervical block was performed using 10 ml of 1% lidocaine.  The block was performed at 4 and 8 o'clock at the cervical vaginal junction.  A 5 mm 30 hysteroscope was then inserted under direct visualization using glycine as a distending medium. The uterine cavity was viewed and the above findings were noted. The Essure tubal occlusion was then inserted through the operative port and the tip of the Essure device easily slid into the left ostia. The coil was advanced and easily placed.   The device was withdrawn. There were 0 trailing coils in the uterine cavity after removal of the insertion device. The device was removed. The right side was not performed in light of the prior right salpingectomy.  All the instruments were removed from the vagina.  Final instrument counts were correct.  The patient was taken to the PACU in stable condition.

## 2010-09-05 NOTE — Anesthesia Procedure Notes (Addendum)
Procedure Name: LMA Insertion Date/Time: 09/05/2010 12:03 PM Performed by: Suella Grove Pre-anesthesia Checklist: Patient identified, Patient being monitored, Emergency Drugs available, Timeout performed and Suction available Patient Re-evaluated:Patient Re-evaluated prior to inductionOxygen Delivery Method: Circle System Utilized Preoxygenation: Pre-oxygenation with 100% oxygen Intubation Type: IV induction Ventilation: Mask ventilation without difficulty LMA: LMA with gastric port inserted LMA Size: 4.0 Grade View: Grade II Number of attempts: 1 Placement Confirmation: breath sounds checked- equal and bilateral and positive ETCO2 Dental Injury: Teeth and Oropharynx as per pre-operative assessment

## 2010-09-05 NOTE — H&P (Signed)
  Chief Complaint: 25 y.o.  who presents for an Essure procedure.  Details of Present Illness: See above.  BP 138/93  Pulse 72  Temp(Src) 98.4 F (36.9 C) (Oral)  Resp 18  Ht 5\' 7"  (1.702 m)  Wt 92.987 kg (205 lb)  BMI 32.11 kg/m2  SpO2 98%  LMP 09/20/2009  Breastfeeding? Unknown  Past Medical History  Diagnosis Date  . GERD (gastroesophageal reflux disease)     on meds  . Headache     migraines  . Anxiety   . Depression    History   Social History  . Marital Status: Married    Spouse Name: N/A    Number of Children: N/A  . Years of Education: N/A   Occupational History  . Not on file.   Social History Main Topics  . Smoking status: Current Everyday Smoker -- 0.5 packs/day for 16 years    Types: Cigarettes  . Smokeless tobacco: Not on file  . Alcohol Use: No  . Drug Use: Yes    Special: Marijuana     37-48 years old  . Sexually Active: Not Currently   Other Topics Concern  . Not on file   Social History Narrative  . No narrative on file   History reviewed. No pertinent family history.  Pertinent items are noted in HPI.  Pre-Op Diagnosis: desires sterilization   Planned Procedure: Procedure(s): ESSURE TUBAL STERILIZATION  I have reviewed the patient's history and have completed the physical exam and CHIAMAKA LATKA is acceptable for surgery.  Roseanna Rainbow, MD 09/05/2010 11:23 AM

## 2010-09-05 NOTE — Transfer of Care (Signed)
Immediate Anesthesia Transfer of Care Note  Patient: Claire Caldwell  Procedure(s) Performed:  ESSURE TUBAL STERILIZATION  Patient Location: PACU  Anesthesia Type: General  Level of Consciousness: awake, alert  and oriented  Airway & Oxygen Therapy: Patient Spontanous Breathing and Patient connected to nasal cannula oxygen  Post-op Assessment: Report given to PACU RN, Post -op Vital signs reviewed and stable and Patient moving all extremities X 4  Post vital signs: Reviewed and stable  Complications: No apparent anesthesia complications

## 2010-09-05 NOTE — Anesthesia Preprocedure Evaluation (Signed)
Anesthesia Evaluation  Name, MR# and DOB Patient awake  General Assessment Comment  Reviewed: Allergy & Precautions, H&P , NPO status , Patient's Chart, lab work & pertinent test results  Airway Mallampati: I      Dental  (+) Teeth Intact   Pulmonary    pulmonary exam normalPulmonary Exam Normal     Cardiovascular + Systolic murmurs    Neuro/Psych   Headaches     GI/Hepatic/Renal   negative Liver ROS  negative Renal ROS   GERD Medicated     Endo/Other  Negative Endocrine ROS (+)      Abdominal Normal abdominal exam  (+)   Musculoskeletal negative musculoskeletal ROS (+)   Hematology negative hematology ROS (+)   Peds  Reproductive/Obstetrics negative OB ROS    Anesthesia Other Findings             Anesthesia Physical Anesthesia Plan  ASA: II  Anesthesia Plan: General   Post-op Pain Management:    Induction: Intravenous  Airway Management Planned: LMA  Additional Equipment:   Intra-op Plan:   Post-operative Plan:   Informed Consent:   Plan Discussed with:   Anesthesia Plan Comments:         Anesthesia Quick Evaluation

## 2010-09-05 NOTE — Anesthesia Postprocedure Evaluation (Signed)
Anesthesia Post Note  Patient: Claire Caldwell  Procedure(s) Performed:  ESSURE TUBAL STERILIZATION  Anesthesia type: General  Patient location: PACU  Post pain: Pain level controlled  Post assessment: Post-op Vital signs reviewed  Last Vitals:  Filed Vitals:   09/05/10 1328  BP:   Pulse: 46  Temp:   Resp: 20    Post vital signs: Reviewed  Level of consciousness: sedated  Complications: No apparent anesthesia complicationsfj

## 2010-09-12 ENCOUNTER — Encounter (INDEPENDENT_AMBULATORY_CARE_PROVIDER_SITE_OTHER): Payer: Self-pay | Admitting: Surgery

## 2010-09-16 ENCOUNTER — Encounter (INDEPENDENT_AMBULATORY_CARE_PROVIDER_SITE_OTHER): Payer: Self-pay | Admitting: Surgery

## 2010-09-16 ENCOUNTER — Ambulatory Visit (INDEPENDENT_AMBULATORY_CARE_PROVIDER_SITE_OTHER): Payer: Medicaid Other | Admitting: Surgery

## 2010-09-16 VITALS — BP 162/104 | HR 68 | Temp 97.3°F | Ht 67.0 in | Wt 201.0 lb

## 2010-09-16 DIAGNOSIS — L732 Hidradenitis suppurativa: Secondary | ICD-10-CM

## 2010-09-16 NOTE — Progress Notes (Signed)
Chief Complaint  Patient presents with  . Other    chronic hidradenitis    HPI Claire Caldwell is a 25 y.o. female.  HPI This is a former patient of mine who is status post excision of hidradenitis in both groins in 2011. Those healed up nicely. Recently she was pregnant and no recently delivered a baby. She also had her tubes tied. Her pregnancy she had a flareup of some new areas in both groins. These have been draining small amounts of purulent fluid. She has been on several different courses of antibiotics with minimal improvement. She comes in today to discuss reexcision. Past Medical History  Diagnosis Date  . GERD (gastroesophageal reflux disease)     on meds  . Headache     migraines  . Anxiety   . Depression     Past Surgical History  Procedure Date  . Tonsillectomy     & adenoids as a child  . Ear cyst excision 2007    bil ears  . Tumor removal 2009    left knee  . Ectopic pregnancy surgery 2008  . Tumor removal 2010    left foot  . 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 Tumor removal 2011    left side of head  . Boils removed 2011    from bil groins  . Essure tubal ligation     History reviewed. No pertinent family history.  Social History History  Substance Use Topics  . Smoking status: Current Everyday Smoker -- 0.5 packs/day for 16 years    Types: Cigarettes  . Smokeless tobacco: Not on file  . Alcohol Use: No    No Known Allergies  Current Outpatient Prescriptions  Medication Sig Dispense Refill  . Acetaminophen-Codeine (TYLENOL/CODEINE #3) 300-30 MG per tablet Take 2 tablets by mouth at bedtime.        . clindamycin (CLEOCIN T) 1 % lotion Apply 1 application topically 2 (two) times daily.        Marland Kitchen FENUGREEK PO Take by mouth daily.        Marland Kitchen omeprazole (PRILOSEC) 20 MG capsule Take 20-40 mg by mouth daily. If reflux is worse, will take 40 mg       . Prenat w/o A-FeCbn-DSS-FA-DHA (CITRANATAL 90 DHA) 90-1 & 250 MG tablet Take 1 tablet by  mouth daily.        Marland Kitchen ibuprofen (ADVIL,MOTRIN) 800 MG tablet Take 1 tablet (800 mg total) by mouth every 8 (eight) hours as needed for pain.  30 tablet  0    Review of Systems Review of Systems  Blood pressure 162/104, pulse 68, temperature 97.3 F (36.3 C), temperature source Temporal, height 5\' 7"  (1.702 m), weight 201 lb (91.173 kg), last menstrual period 09/20/2009. headaches Physical Exam Physical Exam WDWN in NAD HEENT:  EOMI, sclera anicteric Neck:  No masses, no thyromegaly Lungs:  CTA bilaterally; normal respiratory effort CV:  Regular rate and rhythm; no murmurs Abd:  +bowel sounds, soft, non-tender, no masses Ext:  Well-perfused; no edema Skin:  R groin - 2 cm area of induration and erythema with small amt of purulent drainage L groin - two separate 1 cm areas of erythema and tenderness; no drainage  Data Reviewed   Assessment    Hidradenitis - bilateral groins    Plan    Plan excision of hidradenitis bilateral groins. The surgical procedure has been discussed with the patient.  Potential risks, benefits, alternative treatments, and expected outcomes  have been explained.  All of the patient's questions at this time have been answered.  The patient understand the proposed surgical procedure and wishes to proceed.       Ashiah Karpowicz K. 09/16/2010, 10:23 AM

## 2010-09-17 ENCOUNTER — Other Ambulatory Visit (HOSPITAL_COMMUNITY): Payer: Self-pay | Admitting: Nephrology

## 2010-09-17 DIAGNOSIS — R809 Proteinuria, unspecified: Secondary | ICD-10-CM

## 2010-09-17 NOTE — Progress Notes (Signed)
Encounter addended by: Randa Spike on: 09/17/2010 12:16 PM<BR>     Documentation filed: Charges VN

## 2010-09-26 ENCOUNTER — Other Ambulatory Visit: Payer: Self-pay | Admitting: Interventional Radiology

## 2010-09-26 ENCOUNTER — Ambulatory Visit (HOSPITAL_COMMUNITY)
Admission: RE | Admit: 2010-09-26 | Discharge: 2010-09-26 | Disposition: A | Discharge status: Home / Self Care | Payer: Medicaid Other | Source: Ambulatory Visit | Attending: Nephrology | Admitting: Nephrology

## 2010-09-26 DIAGNOSIS — R809 Proteinuria, unspecified: Secondary | ICD-10-CM | POA: Insufficient documentation

## 2010-09-26 DIAGNOSIS — R3129 Other microscopic hematuria: Secondary | ICD-10-CM | POA: Insufficient documentation

## 2010-09-26 LAB — PROTIME-INR
INR: 1 (ref 0.00–1.49)
Prothrombin Time: 13.4 seconds (ref 11.6–15.2)

## 2010-09-26 LAB — APTT: aPTT: 33 seconds (ref 24–37)

## 2010-09-26 LAB — CBC
MCH: 28.3 pg (ref 26.0–34.0)
Platelets: 309 10*3/uL (ref 150–400)
RBC: 4.67 MIL/uL (ref 3.87–5.11)
WBC: 11.8 10*3/uL — ABNORMAL HIGH (ref 4.0–10.5)

## 2010-09-30 ENCOUNTER — Other Ambulatory Visit (INDEPENDENT_AMBULATORY_CARE_PROVIDER_SITE_OTHER): Payer: Self-pay | Admitting: Surgery

## 2010-09-30 ENCOUNTER — Ambulatory Visit (HOSPITAL_BASED_OUTPATIENT_CLINIC_OR_DEPARTMENT_OTHER)
Admission: RE | Admit: 2010-09-30 | Discharge: 2010-09-30 | Disposition: A | Discharge status: Home / Self Care | Payer: Medicaid Other | Source: Ambulatory Visit | Attending: Surgery | Admitting: Surgery

## 2010-09-30 DIAGNOSIS — Z01812 Encounter for preprocedural laboratory examination: Secondary | ICD-10-CM | POA: Insufficient documentation

## 2010-09-30 DIAGNOSIS — L732 Hidradenitis suppurativa: Secondary | ICD-10-CM

## 2010-09-30 HISTORY — PX: INGUINAL HIDRADENITIS EXCISION: SHX1827

## 2010-10-01 NOTE — Op Note (Signed)
  [name], Claire NO.:  [id number]  MEDICAL RECORD NO.:  [id number]  LOCATION:                                 FACILITY:  PHYSICIAN:  Wilmon Arms. Corliss Skains, M.D. DATE OF BIRTH:  10-10-1985  DATE OF PROCEDURE:  09/30/2010 DATE OF DISCHARGE:                              OPERATIVE REPORT   PREOPERATIVE DIAGNOSIS:  Bilateral inguinal hidradenitis.  POSTOPERATIVE DIAGNOSIS:  Bilateral inguinal hidradenitis.  PROCEDURE:  Excision of bilateral inguinal hidradenitis with complex layered closure.  SURGEON:  Wilmon Arms. Corliss Skains, MD  ANESTHESIA:  General  INDICATIONS:  This is an 25 year old female who has had recurrent problems with inguinal and axillary hidradenitis.  She has had previous excision but has had a recurrence after her last pregnancy. Conservative treatment with antibiotics had been unsuccessful.  She presents now for wide excision.  DESCRIPTION OF PROCEDURE:  The patient was brought to the operatingroom, placed in the supine position on the operating room table.  After adequate level of general anesthesia was obtained, the patient was placed in lithotomy position in [place]ofin stirrups .  She had obvious subcutaneous abscesses in both groins adjacent to her labia.  We prepped the entire perineum with Betadine and draped in sterile fashion.  We infiltrated the area around the right groin abscesses with 0.25% Marcaine with epinephrine.  We made wide elliptical incisions around these two abscesses and dissected sharply around all the scar and granulation tissue.  We excised back to healthy appearing fat.  We excised all the way down to the fascia.  We copiously irrigated both wounds and cauterized thoroughly for hemostasis.  Once we had finished irrigating, the patient had a 5 cm long incision.  We closed this in several layers with deep layers of 3-0 Vicryl interrupted as well as a subcuticular 4-0 Monocryl.  We then turned our attention to the patient's  left side.  She had 2 similarly located abscesses.  We widely excised this hidradenitis and send separately for pathologic examination.  She also had a fairly long incision which was closed in multiple layers with 3-0 Vicryl and 4-0 Monocryl.  We thoroughly cleaned this skin and sealed both incisions with Dermabond and with this was allowed to dry.  The patient was then extubated and brought to recovery room in stable condition.  All sponge, instrument,  and needle counts were correct.     Wilmon Arms. Corliss Skains, M.D.    MKT/MEDQ  D:  09/30/2010  T:  09/30/2010  Job:  [id number]  Electronically Signed by Manus Rudd M.D. on 10/01/2010 01:37:00 PM

## 2010-10-10 ENCOUNTER — Encounter (INDEPENDENT_AMBULATORY_CARE_PROVIDER_SITE_OTHER): Payer: Self-pay | Admitting: Surgery

## 2010-10-14 ENCOUNTER — Ambulatory Visit (INDEPENDENT_AMBULATORY_CARE_PROVIDER_SITE_OTHER): Payer: Medicaid Other | Admitting: Surgery

## 2010-10-14 ENCOUNTER — Encounter (INDEPENDENT_AMBULATORY_CARE_PROVIDER_SITE_OTHER): Payer: Self-pay | Admitting: Surgery

## 2010-10-14 VITALS — BP 140/92 | HR 80 | Temp 96.9°F | Resp 16 | Ht 67.0 in | Wt 194.6 lb

## 2010-10-14 DIAGNOSIS — L732 Hidradenitis suppurativa: Secondary | ICD-10-CM

## 2010-10-14 NOTE — Patient Instructions (Signed)
Neosporin and dry gauze to both groin incisions until they are healed completely.  Follow-up in 2-3 weeks.

## 2010-10-14 NOTE — Progress Notes (Signed)
Status post wide excision of bilateral groin hidradenitis on September 25. Pathology confirmed only hidradenitis. Overall the wound seems be healing well. There are only a couple of small superficial openings. No deep infection was noted. She may treat these open areas with Neosporin and a dry dressing. Followup in 2-3 weeks.

## 2010-10-15 ENCOUNTER — Other Ambulatory Visit: Payer: Self-pay | Admitting: Obstetrics & Gynecology

## 2010-10-15 DIAGNOSIS — N971 Female infertility of tubal origin: Secondary | ICD-10-CM

## 2010-10-17 LAB — URINALYSIS, ROUTINE W REFLEX MICROSCOPIC
Bilirubin Urine: NEGATIVE
Leukocytes, UA: NEGATIVE
Nitrite: NEGATIVE
Specific Gravity, Urine: >1.03 — ABNORMAL HIGH
Urobilinogen, UA: 0.2

## 2010-10-17 LAB — URINE MICROSCOPIC-ADD ON

## 2010-10-17 LAB — CBC
Hemoglobin: 10.7 — ABNORMAL LOW
MCHC: 35.1
MCV: 84.6
MCV: 85.6
Platelets: 304
RBC: 3.6 — ABNORMAL LOW
WBC: 16.5 — ABNORMAL HIGH

## 2010-10-17 LAB — HCG, QUANTITATIVE, PREGNANCY: hCG, Beta Chain, Quant, S: 429 — ABNORMAL HIGH

## 2010-10-20 LAB — URINE MICROSCOPIC-ADD ON

## 2010-10-20 LAB — URINALYSIS, ROUTINE W REFLEX MICROSCOPIC
Glucose, UA: NEGATIVE
Leukocytes, UA: NEGATIVE
pH: 6

## 2010-10-20 LAB — GC/CHLAMYDIA PROBE AMP, GENITAL: GC Probe Amp, Genital: NEGATIVE

## 2010-10-21 ENCOUNTER — Encounter (INDEPENDENT_AMBULATORY_CARE_PROVIDER_SITE_OTHER): Payer: Self-pay | Admitting: Surgery

## 2010-10-29 ENCOUNTER — Ambulatory Visit: Payer: Medicaid Other | Attending: Obstetrics & Gynecology | Admitting: Physical Therapy

## 2010-10-29 DIAGNOSIS — M545 Low back pain, unspecified: Secondary | ICD-10-CM | POA: Insufficient documentation

## 2010-10-29 DIAGNOSIS — M25659 Stiffness of unspecified hip, not elsewhere classified: Secondary | ICD-10-CM | POA: Insufficient documentation

## 2010-10-29 DIAGNOSIS — IMO0001 Reserved for inherently not codable concepts without codable children: Secondary | ICD-10-CM | POA: Insufficient documentation

## 2010-10-29 DIAGNOSIS — M6281 Muscle weakness (generalized): Secondary | ICD-10-CM | POA: Insufficient documentation

## 2010-11-05 ENCOUNTER — Ambulatory Visit (INDEPENDENT_AMBULATORY_CARE_PROVIDER_SITE_OTHER): Payer: Medicaid Other | Admitting: Surgery

## 2010-11-05 ENCOUNTER — Encounter (INDEPENDENT_AMBULATORY_CARE_PROVIDER_SITE_OTHER): Payer: Self-pay | Admitting: Surgery

## 2010-11-05 VITALS — BP 134/86 | HR 60 | Temp 96.5°F | Resp 20 | Ht 67.0 in | Wt 199.0 lb

## 2010-11-05 DIAGNOSIS — L732 Hidradenitis suppurativa: Secondary | ICD-10-CM

## 2010-11-05 MED ORDER — CIPROFLOXACIN HCL 500 MG PO TABS: 500.0000 mg | ORAL_TABLET | Freq: Two times a day (BID) | ORAL | Status: AC

## 2010-11-05 NOTE — Progress Notes (Signed)
11/05/10   The incisions both seem to be healing well.  She has a new abscess on the left side anterior to the incision.  We drained this under local anesthetic.  We will try a different antibiotic - Cipro for two weeks.        Previous note 10/9  Status post wide excision of bilateral groin hidradenitis on September 25. Pathology confirmed only hidradenitis. Overall the wound seems be healing well. There are only a couple of small superficial openings. No deep infection was noted. She may treat these open areas with Neosporin and a dry dressing. Followup in 2-3 weeks.

## 2010-11-05 NOTE — Patient Instructions (Signed)
Start taking the antibiotics.  Dry gauze and neosporin to the left groin.  Follow-up in 2-3 weeks

## 2010-11-12 ENCOUNTER — Ambulatory Visit: Payer: Medicaid Other | Attending: Obstetrics & Gynecology | Admitting: Physical Therapy

## 2010-11-12 DIAGNOSIS — M6281 Muscle weakness (generalized): Secondary | ICD-10-CM | POA: Insufficient documentation

## 2010-11-12 DIAGNOSIS — M25659 Stiffness of unspecified hip, not elsewhere classified: Secondary | ICD-10-CM | POA: Insufficient documentation

## 2010-11-12 DIAGNOSIS — M545 Low back pain, unspecified: Secondary | ICD-10-CM | POA: Insufficient documentation

## 2010-11-12 DIAGNOSIS — IMO0001 Reserved for inherently not codable concepts without codable children: Secondary | ICD-10-CM | POA: Insufficient documentation

## 2010-11-17 ENCOUNTER — Encounter (INDEPENDENT_AMBULATORY_CARE_PROVIDER_SITE_OTHER): Payer: Self-pay | Admitting: Surgery

## 2010-11-19 ENCOUNTER — Ambulatory Visit: Payer: Medicaid Other | Admitting: Physical Therapy

## 2010-12-02 ENCOUNTER — Encounter (INDEPENDENT_AMBULATORY_CARE_PROVIDER_SITE_OTHER): Payer: Medicaid Other | Admitting: Surgery

## 2010-12-03 ENCOUNTER — Ambulatory Visit: Payer: Medicaid Other | Admitting: Physical Therapy

## 2010-12-04 ENCOUNTER — Ambulatory Visit (INDEPENDENT_AMBULATORY_CARE_PROVIDER_SITE_OTHER): Payer: Medicaid Other | Admitting: Surgery

## 2010-12-04 ENCOUNTER — Encounter (INDEPENDENT_AMBULATORY_CARE_PROVIDER_SITE_OTHER): Payer: Self-pay | Admitting: Surgery

## 2010-12-04 VITALS — BP 146/82 | HR 60 | Temp 97.9°F | Resp 18 | Ht 67.0 in | Wt 193.0 lb

## 2010-12-04 DIAGNOSIS — L732 Hidradenitis suppurativa: Secondary | ICD-10-CM

## 2010-12-04 NOTE — Patient Instructions (Signed)
Continue your antibiotics.  We will refer you to plastic surgery for evaluation.

## 2010-12-04 NOTE — Progress Notes (Signed)
She returns for another visit. The area that we lanced at the last visit has healed but she has had several other areas that have opened up and began draining small amounts. On examination she has some tenderness but no areas of erythema. She has a couple of small punctate openings but hardly any drainage. My concern is that she may have some small fistulas that her tracking into the subcutaneous tissue. Despite our wide excision she seems to have recurred her hidradenitis. At this point I would like to with her her to a plastic surgeon for evaluation of possible wide excision of both groins with probable skin grafts. Recovery from this extensive type of surgery would be difficult given her young children but she would like to at least be evaluated in consultation. In the meantime, we will continue her antibiotics with Cipro 500 mg b.i.d. Will reevaluate in 4 weeks.

## 2010-12-08 ENCOUNTER — Ambulatory Visit (HOSPITAL_COMMUNITY)
Admission: RE | Admit: 2010-12-08 | Discharge: 2010-12-08 | Disposition: A | Discharge status: Home / Self Care | Payer: Medicaid Other | Source: Ambulatory Visit | Attending: Obstetrics & Gynecology | Admitting: Obstetrics & Gynecology

## 2010-12-08 DIAGNOSIS — Z3049 Encounter for surveillance of other contraceptives: Secondary | ICD-10-CM | POA: Insufficient documentation

## 2010-12-08 DIAGNOSIS — N971 Female infertility of tubal origin: Secondary | ICD-10-CM

## 2010-12-08 LAB — PREGNANCY, URINE: Preg Test, Ur: NEGATIVE

## 2010-12-08 MED ORDER — IOHEXOL 300 MG/ML  SOLN: 6.0000 mL | Freq: Once | INTRAMUSCULAR | Status: AC | PRN

## 2010-12-29 ENCOUNTER — Ambulatory Visit (INDEPENDENT_AMBULATORY_CARE_PROVIDER_SITE_OTHER): Payer: Medicaid Other | Admitting: Surgery

## 2010-12-29 ENCOUNTER — Encounter (INDEPENDENT_AMBULATORY_CARE_PROVIDER_SITE_OTHER): Payer: Self-pay | Admitting: Surgery

## 2010-12-29 VITALS — BP 120/88 | HR 76 | Temp 97.9°F | Resp 18 | Ht 67.0 in | Wt 191.8 lb

## 2010-12-29 DIAGNOSIS — L732 Hidradenitis suppurativa: Secondary | ICD-10-CM

## 2010-12-29 NOTE — Progress Notes (Signed)
The patient had some more drainage from her right groin last week, but this seems to be improving.  She is here for a quick recheck.  At the anterior end of her incision on the right, there is a small superficial abscess that has spontaneously drained.   The remainder of both groins seems to be healing well.  These areas look as good as they ever have since I started seeing this patient.  Continue Cipro until complete.  Follow-up PRn.  Her appointment with Plastics is pending.    Wilmon Arms. Corliss Skains, MD, [facility]  12/29/2010 11:21 AM

## 2011-01-08 ENCOUNTER — Encounter (INDEPENDENT_AMBULATORY_CARE_PROVIDER_SITE_OTHER): Payer: Medicaid Other | Admitting: Surgery

## 2011-01-13 IMAGING — US US OB FOLLOW-UP
1 series · 14 of 28 positions shown · non-contrast
Comparison: none

OBSTETRICAL ULTRASOUND:
 This ultrasound was performed in The [HOSPITAL], and the [facility] report will be stored to [REDACTED] PACS.

[Series 1: us ob follow-up · 14 of 64 slices shown]
[im 3/64]
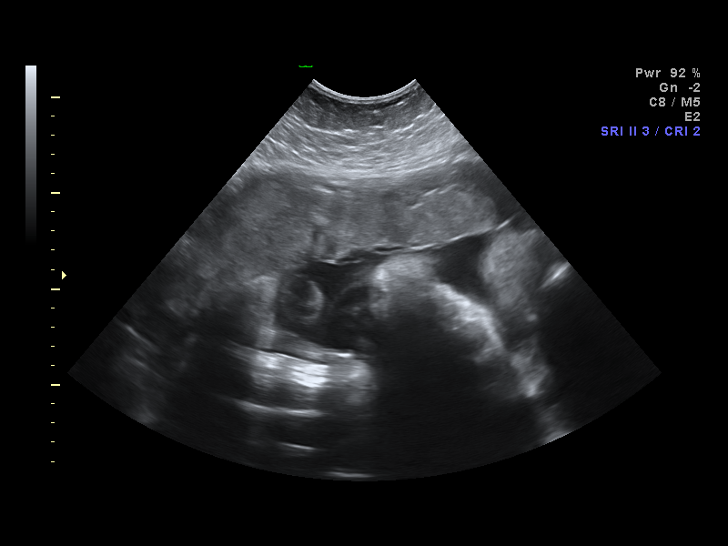
[im 8/64]
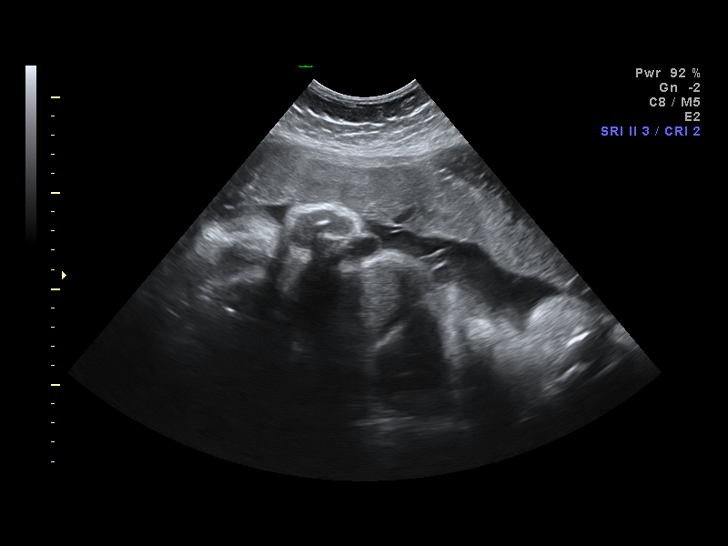
[im 12/64]
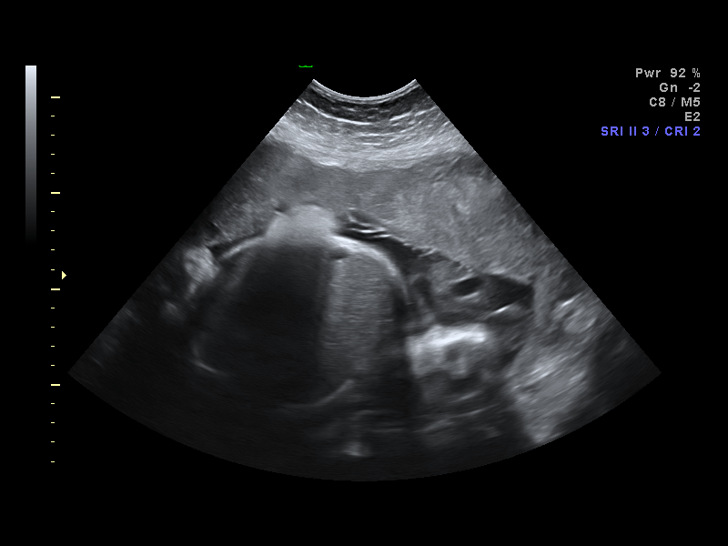
[im 17/64]
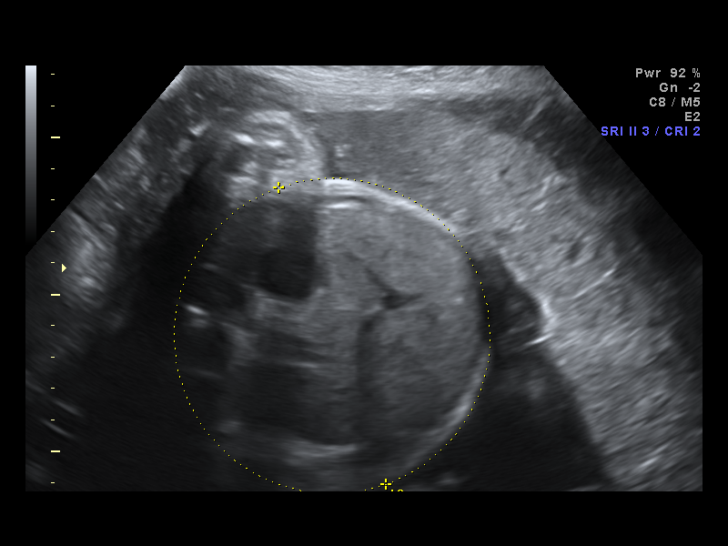
[im 22/64]
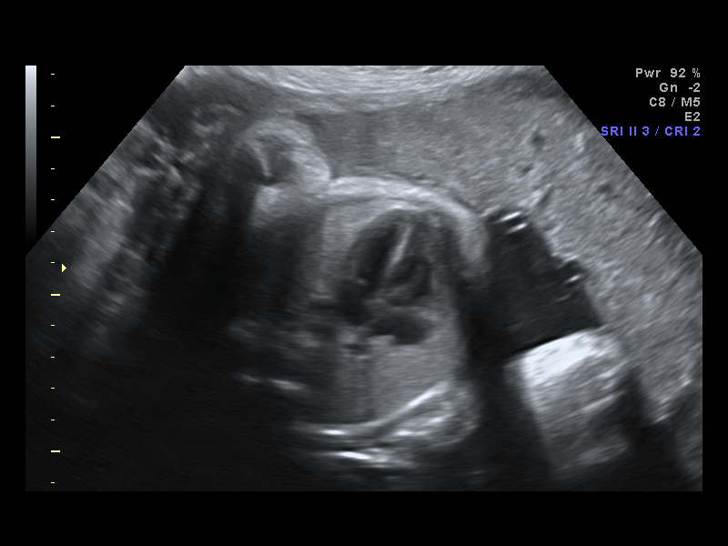
[im 26/64]
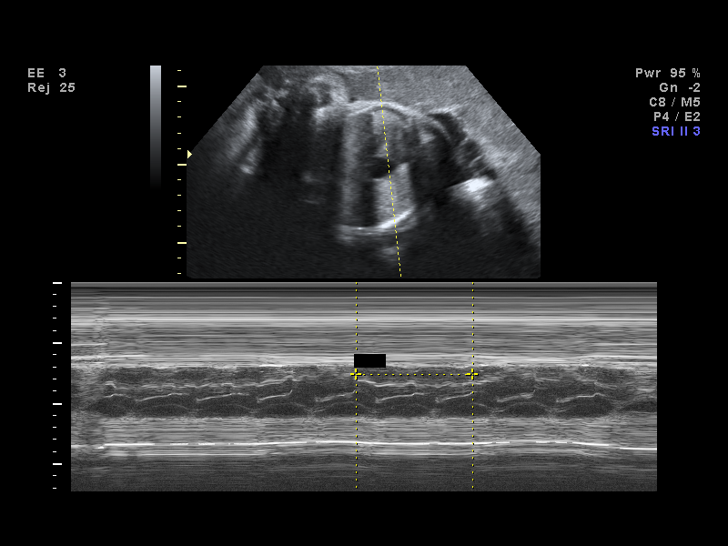
[im 31/64]
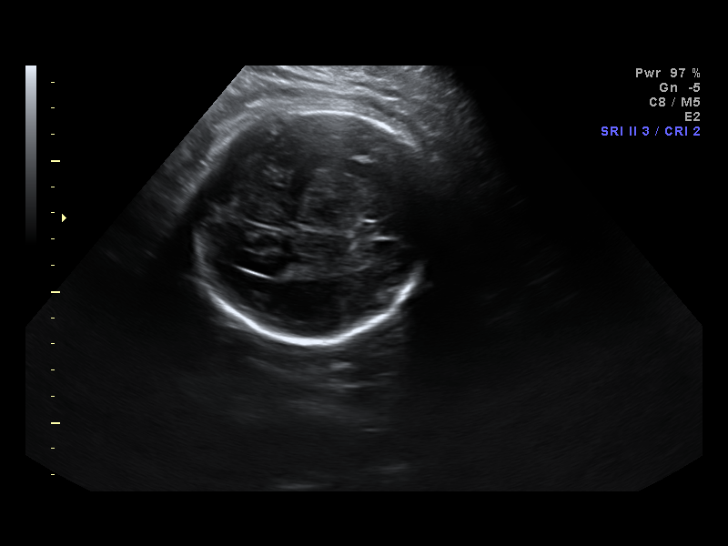
[im 36/64]
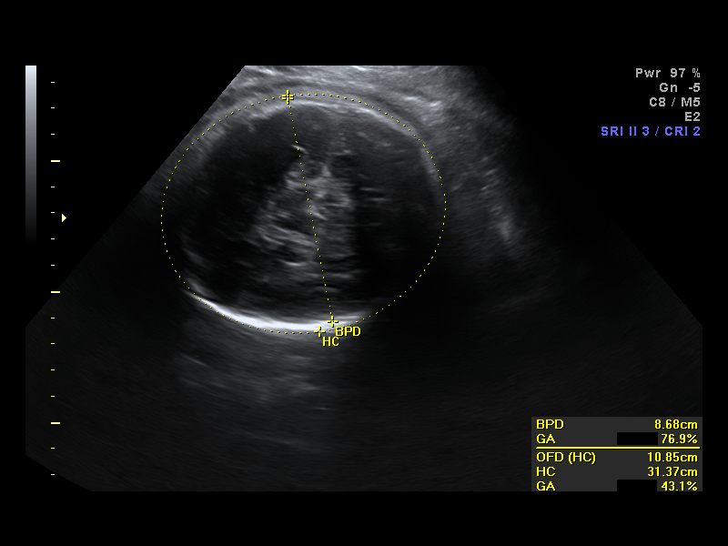
[im 40/64]
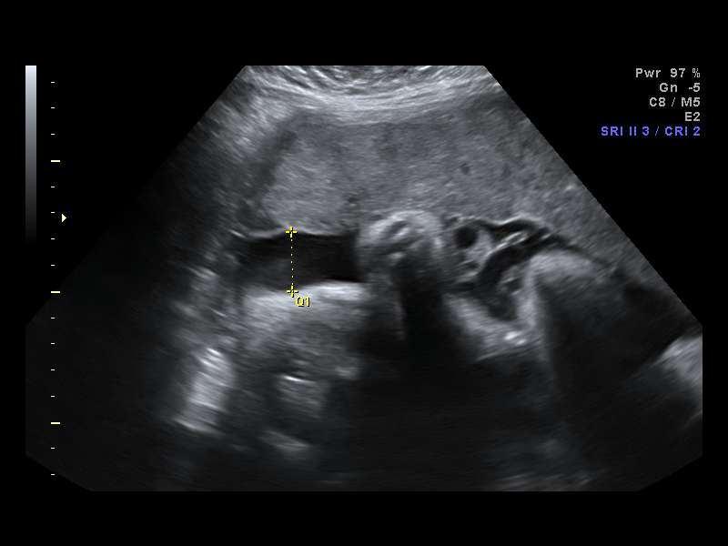
[im 45/64]
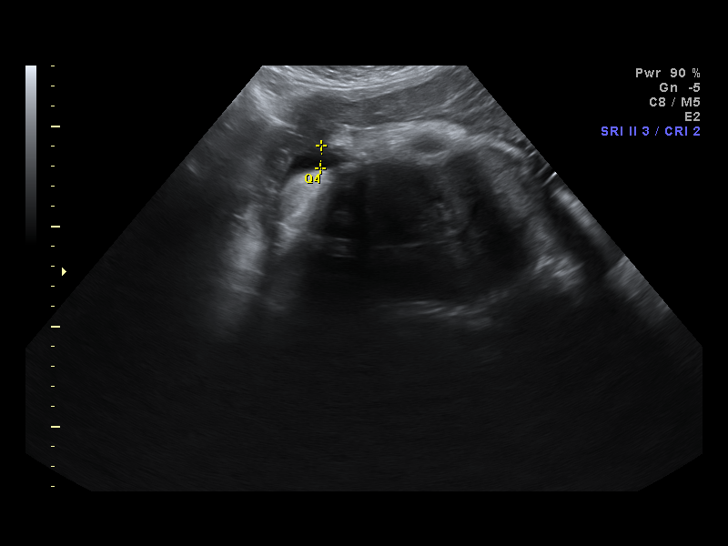
[im 50/64]
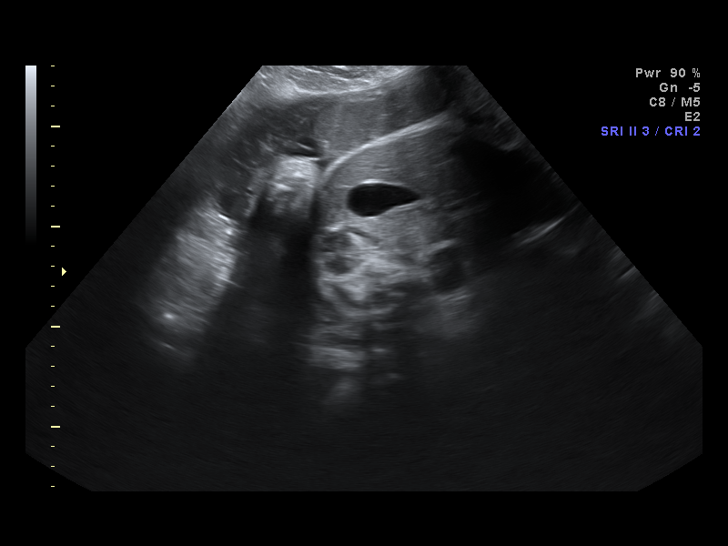
[im 54/64]
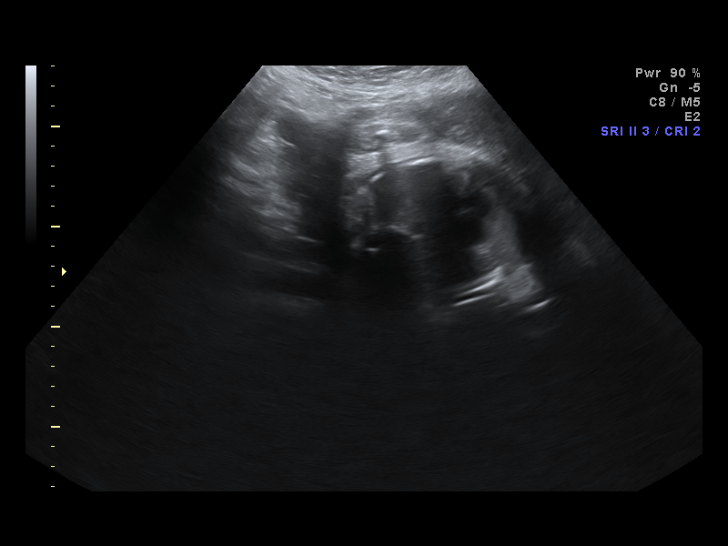
[im 59/64]
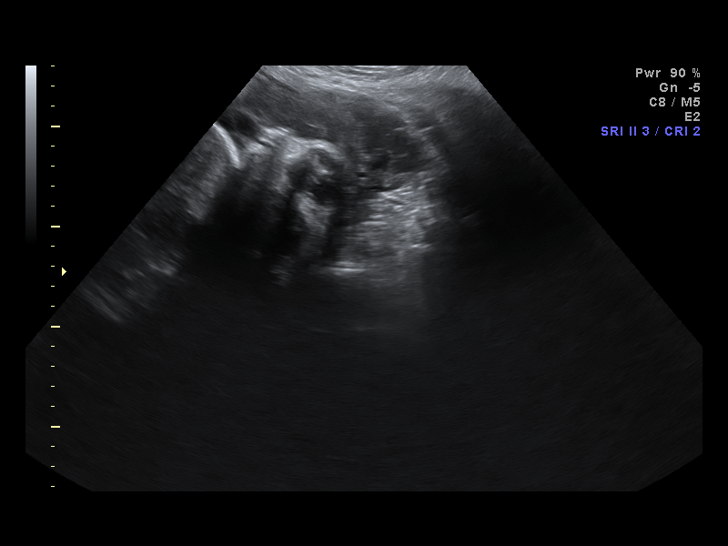
[im 64/64]
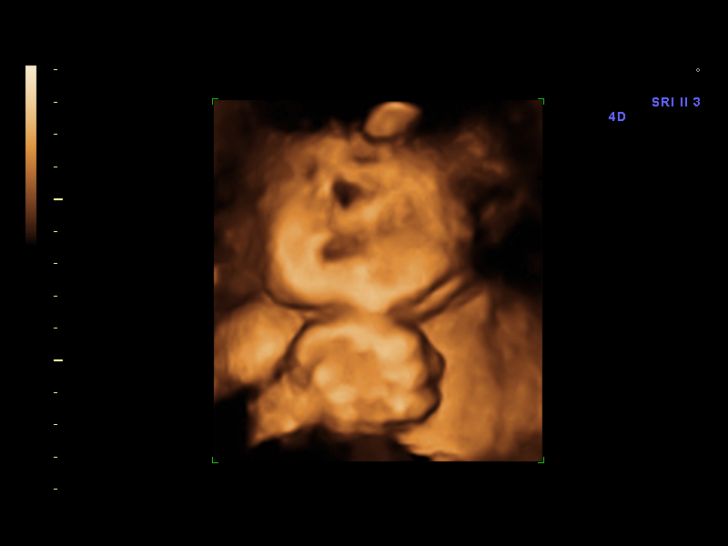

[14 of 28 positions shown; findings below may reference images not displayed]

IMPRESSION: [facility] has also been faxed to the ordering physician.

## 2011-01-14 ENCOUNTER — Encounter (HOSPITAL_BASED_OUTPATIENT_CLINIC_OR_DEPARTMENT_OTHER): Payer: Medicaid Other | Attending: Plastic Surgery

## 2011-01-14 DIAGNOSIS — L732 Hidradenitis suppurativa: Secondary | ICD-10-CM | POA: Insufficient documentation

## 2011-01-14 DIAGNOSIS — L738 Other specified follicular disorders: Secondary | ICD-10-CM | POA: Insufficient documentation

## 2011-01-14 DIAGNOSIS — Z79899 Other long term (current) drug therapy: Secondary | ICD-10-CM | POA: Insufficient documentation

## 2011-01-15 NOTE — Progress Notes (Signed)
[facility]  NAME:  Claire, Caldwell NO.:  [id number]  MEDICAL RECORD NO.:  [id number]      DATE OF BIRTH:  1985-03-26  PHYSICIAN:  Wayland Denis, DO       VISIT DATE:  01/14/2011                                  OFFICE VISIT   CHIEF COMPLAINT:  Hidradenitis.  HISTORY OF PRESENT ILLNESS:  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  She had a child about 6 months ago via a normal vaginal delivery.  She has been dealing with hidradenitis for a number of years.  She has been on and off antibiotics including doxycycline and currently Cipro.  She has had a number of the areas in the groin excised, multiple areas that were lanced and drained.  She states that if they are just lanced and drained, that they reoccur, that she has terrible ingrown hair.  At one point, it was advised for her to do waxing, which I cautioned her against, but she does shave the area to try and keep things under control, that seems to help some.  It is certainly made worse with more activity and heat.  It is extremely tender and very difficult to keep clean once abscess is developed.  PAST MEDICAL HISTORY:  Salivary gland tumor, bilateral ear tumors, hidradenitis and kidney stones.  PAST SURGICAL HISTORY:  Excision of hidradenitis of the groin, bilateral excision of ear tumors,  right salivary gland tumor, and surgery on the knee and foot.  MEDICATIONS:  Cipro, Prilosec, and prenatal vitamins.  ALLERGIES:  No known drug allergies.  SOCIAL HISTORY:  Lives at home.  Has a child and denies smoking.  Denies any significant change in weight, difficulty breathing, chest pain, blood in her urine or stool.  PHYSICAL EXAMINATION:  VITAL SIGNS:  She is alert, oriented, cooperative, not in any acute distress.  She seems to be a good historian. HEENT:  Her pupils are equal.  Extraocular muscles  are intact. NECK:  No cervical lymphadenopathy. CHEST:  Her breathing is unlabored. HEART:  Regular. ABDOMEN:  Soft. EXTREMITIES:  Have good pulses with good capillary refill.  Strength is 5/5. NEUROLOGICAL:  She is intact.  She does not appear to be depressed.  She is appropriate for questioning.  She has several tender areas in the groin, none are currently ulcerated, but I can feel a little bit of hard area and on the right side, has 1 area that is draining a little bit of yellow drainage.  RECOMMENDATION:  For continued warm soaks, bath with Dial soap. Multivitamin, vitamin C, and zinc.  Continue antibiotics and continue to follow up with General Surgery for excision.  I do think she would benefit from laser therapy to help with ingrown hair and is something we can do when things are more clear enough, and I put a call into her general surgeon to discuss, and we will see her back at that time.     Wayland Denis, DO     CS/MEDQ  D:  01/14/2011  T:  01/15/2011  Job:  [id number]

## 2011-02-07 ENCOUNTER — Telehealth (INDEPENDENT_AMBULATORY_CARE_PROVIDER_SITE_OTHER): Payer: Self-pay | Admitting: General Surgery

## 2011-02-07 NOTE — Telephone Encounter (Signed)
Hx of groin hidradenitis.  C/o sharp pain in groin area in the area of 3 spots which are draining.  She is not on antibiotics now. She did not think that these were swollen and needed drainage.  I recommended that she come in to the ER for evaluation and possible I/D or antibiotics if necessary.

## 2011-02-11 ENCOUNTER — Other Ambulatory Visit: Payer: Self-pay | Admitting: Obstetrics & Gynecology

## 2011-02-13 ENCOUNTER — Other Ambulatory Visit: Payer: Self-pay | Admitting: Obstetrics

## 2011-02-17 ENCOUNTER — Telehealth (INDEPENDENT_AMBULATORY_CARE_PROVIDER_SITE_OTHER): Payer: Self-pay | Admitting: General Surgery

## 2011-02-17 ENCOUNTER — Other Ambulatory Visit (INDEPENDENT_AMBULATORY_CARE_PROVIDER_SITE_OTHER): Payer: Self-pay | Admitting: Surgery

## 2011-02-17 DIAGNOSIS — L732 Hidradenitis suppurativa: Secondary | ICD-10-CM

## 2011-02-17 MED ORDER — CIPROFLOXACIN HCL 500 MG PO TABS: 500.0000 mg | ORAL_TABLET | Freq: Two times a day (BID) | ORAL | Status: DC

## 2011-02-17 NOTE — Telephone Encounter (Signed)
I CONTACTED PATIENT TO TELL HER ABOUT PAIN MEDICATION TO BE CALLED IN AND ANTIBIOTIC SENT TO PHARMACY/ SHE IS AWARE/GY

## 2011-02-17 NOTE — Telephone Encounter (Signed)
cipro prescription has been sent to her pharmacy with two refills.  Wilmon Arms. Corliss Skains, MD, [facility]  02/17/2011 4:47 PM

## 2011-02-17 NOTE — Telephone Encounter (Signed)
Per Dr Corliss Skains she could have the Vicodin protocol.  I called in Vicodin 5/325 1po q6prn #30 no refills, generic [facility].  Germaine called the patient and made her aware.

## 2011-02-17 NOTE — Telephone Encounter (Signed)
PT CALLED RE 3 NEW GROIN ABSCESSES. SHE WAS GOING TO SEE DR. Corliss Skains THIS WEEK BUT SON IS IN [place] AND SHE WILL NOT BE ABLE TO COME THIS WEEK. SHE IS REQUESTING ANTIBIOTIC UNTIL SHE CAN BE SEEN AND ALSO PAIN MEDICATION/ [facility]Y IS [facility] [address]/ [phone]./ HER CALL BACK PHONE # IS [phone]/ SHE WAS LAST SEEN FOR THIS PROBLEM IN December.Lanier Prude

## 2011-02-18 ENCOUNTER — Encounter (HOSPITAL_BASED_OUTPATIENT_CLINIC_OR_DEPARTMENT_OTHER): Payer: Medicaid Other | Attending: Plastic Surgery

## 2011-02-18 DIAGNOSIS — L732 Hidradenitis suppurativa: Secondary | ICD-10-CM | POA: Insufficient documentation

## 2011-02-18 DIAGNOSIS — Z79899 Other long term (current) drug therapy: Secondary | ICD-10-CM | POA: Insufficient documentation

## 2011-02-18 DIAGNOSIS — L738 Other specified follicular disorders: Secondary | ICD-10-CM | POA: Insufficient documentation

## 2011-02-23 ENCOUNTER — Encounter (HOSPITAL_COMMUNITY): Payer: Self-pay | Admitting: *Deleted

## 2011-02-26 ENCOUNTER — Encounter (HOSPITAL_COMMUNITY): Payer: Self-pay | Admitting: Pharmacist

## 2011-03-12 ENCOUNTER — Other Ambulatory Visit (INDEPENDENT_AMBULATORY_CARE_PROVIDER_SITE_OTHER): Payer: Self-pay | Admitting: Surgery

## 2011-03-12 MED ORDER — CIPROFLOXACIN HCL 500 MG PO TABS: 500.0000 mg | ORAL_TABLET | Freq: Two times a day (BID) | ORAL | Status: AC

## 2011-03-13 ENCOUNTER — Ambulatory Visit (HOSPITAL_COMMUNITY): Payer: Medicaid Other | Admitting: Anesthesiology

## 2011-03-13 ENCOUNTER — Encounter (HOSPITAL_COMMUNITY): Payer: Self-pay | Admitting: Anesthesiology

## 2011-03-13 ENCOUNTER — Encounter (HOSPITAL_COMMUNITY): Payer: Self-pay | Admitting: *Deleted

## 2011-03-13 ENCOUNTER — Encounter (HOSPITAL_COMMUNITY)
Admission: RE | Disposition: A | Discharge status: Home / Self Care | Payer: Self-pay | Source: Ambulatory Visit | Attending: Obstetrics & Gynecology

## 2011-03-13 ENCOUNTER — Ambulatory Visit (HOSPITAL_COMMUNITY)
Admission: RE | Admit: 2011-03-13 | Discharge: 2011-03-13 | Disposition: A | Discharge status: Home / Self Care | Payer: Medicaid Other | Source: Ambulatory Visit | Attending: Obstetrics & Gynecology | Admitting: Obstetrics & Gynecology

## 2011-03-13 DIAGNOSIS — N926 Irregular menstruation, unspecified: Secondary | ICD-10-CM | POA: Diagnosis present

## 2011-03-13 DIAGNOSIS — N938 Other specified abnormal uterine and vaginal bleeding: Secondary | ICD-10-CM | POA: Insufficient documentation

## 2011-03-13 DIAGNOSIS — N949 Unspecified condition associated with female genital organs and menstrual cycle: Secondary | ICD-10-CM | POA: Insufficient documentation

## 2011-03-13 HISTORY — PX: DILATION AND CURETTAGE OF UTERUS: SHX78

## 2011-03-13 HISTORY — PX: HYSTEROSCOPY W/ ENDOMETRIAL ABLATION: SUR665

## 2011-03-13 LAB — CBC
HCT: 43.3 % (ref 36.0–46.0)
Hemoglobin: 14.3 g/dL (ref 12.0–15.0)
MCHC: 33 g/dL (ref 30.0–36.0)
MCV: 86.3 fL (ref 78.0–100.0)

## 2011-03-13 SURGERY — DILATATION & CURETTAGE/HYSTEROSCOPY WITH NOVASURE ABLATION
Anesthesia: General | Site: Uterus | Wound class: Clean Contaminated

## 2011-03-13 MED ORDER — LACTATED RINGERS IV SOLN
INTRAVENOUS | Status: DC
  Administered 2011-03-13 (×2): via INTRAVENOUS

## 2011-03-13 MED ORDER — LIDOCAINE HCL (CARDIAC) 20 MG/ML IV SOLN
INTRAVENOUS | Status: DC | PRN
  Administered 2011-03-13: 60 mg via INTRAVENOUS

## 2011-03-13 MED ORDER — LIDOCAINE HCL (CARDIAC) 20 MG/ML IV SOLN
INTRAVENOUS | Status: AC
  Filled 2011-03-13: qty 5

## 2011-03-13 MED ORDER — FENTANYL CITRATE 0.05 MG/ML IJ SOLN
INTRAMUSCULAR | Status: AC
  Filled 2011-03-13: qty 2

## 2011-03-13 MED ORDER — GLYCOPYRROLATE 0.2 MG/ML IJ SOLN
INTRAMUSCULAR | Status: AC
  Filled 2011-03-13: qty 1

## 2011-03-13 MED ORDER — PROPOFOL 10 MG/ML IV EMUL
INTRAVENOUS | Status: AC
  Filled 2011-03-13: qty 20

## 2011-03-13 MED ORDER — ACETAMINOPHEN 325 MG PO TABS: 325.0000 mg | ORAL_TABLET | ORAL | Status: DC | PRN

## 2011-03-13 MED ORDER — KETOROLAC TROMETHAMINE 30 MG/ML IJ SOLN
INTRAMUSCULAR | Status: AC
  Filled 2011-03-13: qty 1

## 2011-03-13 MED ORDER — LACTATED RINGERS IV SOLN
INTRAVENOUS | Status: DC | PRN
  Administered 2011-03-13: 3000 mL via INTRAUTERINE

## 2011-03-13 MED ORDER — ONDANSETRON HCL 4 MG/2ML IJ SOLN
INTRAMUSCULAR | Status: DC | PRN
  Administered 2011-03-13: 4 mg via INTRAVENOUS

## 2011-03-13 MED ORDER — MIDAZOLAM HCL 5 MG/5ML IJ SOLN
INTRAMUSCULAR | Status: DC | PRN
  Administered 2011-03-13: 2 mg via INTRAVENOUS

## 2011-03-13 MED ORDER — OXYCODONE-ACETAMINOPHEN 5-325 MG PO TABS: 2.0000 | ORAL_TABLET | Freq: Four times a day (QID) | ORAL | Status: AC | PRN

## 2011-03-13 MED ORDER — KETOROLAC TROMETHAMINE 60 MG/2ML IM SOLN
INTRAMUSCULAR | Status: AC
  Filled 2011-03-13: qty 2

## 2011-03-13 MED ORDER — KETOROLAC TROMETHAMINE 30 MG/ML IJ SOLN
30.0000 mg | Freq: Once | INTRAMUSCULAR | Status: AC
  Administered 2011-03-13: 30 mg via INTRAVENOUS

## 2011-03-13 MED ORDER — FENTANYL CITRATE 0.05 MG/ML IJ SOLN
25.0000 ug | INTRAMUSCULAR | Status: DC | PRN
  Administered 2011-03-13: 50 ug via INTRAVENOUS

## 2011-03-13 MED ORDER — KETOROLAC TROMETHAMINE 30 MG/ML IJ SOLN
INTRAMUSCULAR | Status: AC
  Administered 2011-03-13: 30 mg via INTRAVENOUS
  Filled 2011-03-13: qty 1

## 2011-03-13 MED ORDER — LIDOCAINE HCL 1 % IJ SOLN
INTRAMUSCULAR | Status: DC | PRN
  Administered 2011-03-13: 10 mL

## 2011-03-13 MED ORDER — DEXAMETHASONE SODIUM PHOSPHATE 10 MG/ML IJ SOLN
INTRAMUSCULAR | Status: AC
  Filled 2011-03-13: qty 1

## 2011-03-13 MED ORDER — KETOROLAC TROMETHAMINE 30 MG/ML IJ SOLN: 15.0000 mg | Freq: Once | INTRAMUSCULAR | Status: DC | PRN

## 2011-03-13 MED ORDER — MEPERIDINE HCL 25 MG/ML IJ SOLN: 6.2500 mg | INTRAMUSCULAR | Status: DC | PRN

## 2011-03-13 MED ORDER — ONDANSETRON HCL 4 MG/2ML IJ SOLN
INTRAMUSCULAR | Status: AC
  Filled 2011-03-13: qty 2

## 2011-03-13 MED ORDER — OXYCODONE-ACETAMINOPHEN 5-325 MG PO TABS
1.0000 | ORAL_TABLET | ORAL | Status: DC | PRN
  Administered 2011-03-13: 1 via ORAL

## 2011-03-13 MED ORDER — FENTANYL CITRATE 0.05 MG/ML IJ SOLN
INTRAMUSCULAR | Status: DC | PRN
  Administered 2011-03-13 (×2): 100 ug via INTRAVENOUS

## 2011-03-13 MED ORDER — FENTANYL CITRATE 0.05 MG/ML IJ SOLN
INTRAMUSCULAR | Status: DC
Start: 2011-03-13 — End: 2011-03-13
  Filled 2011-03-13: qty 2

## 2011-03-13 MED ORDER — DEXAMETHASONE SODIUM PHOSPHATE 4 MG/ML IJ SOLN
INTRAMUSCULAR | Status: DC | PRN
  Administered 2011-03-13: 10 mg via INTRAVENOUS

## 2011-03-13 MED ORDER — ONDANSETRON HCL 4 MG/2ML IJ SOLN: 4.0000 mg | Freq: Once | INTRAMUSCULAR | Status: DC | PRN

## 2011-03-13 MED ORDER — FENTANYL CITRATE 0.05 MG/ML IJ SOLN: 25.0000 ug | INTRAMUSCULAR | Status: DC | PRN

## 2011-03-13 MED ORDER — GLYCOPYRROLATE 0.2 MG/ML IJ SOLN
INTRAMUSCULAR | Status: DC | PRN
  Administered 2011-03-13: 0.2 mg via INTRAVENOUS

## 2011-03-13 MED ORDER — MIDAZOLAM HCL 2 MG/2ML IJ SOLN
INTRAMUSCULAR | Status: AC
  Filled 2011-03-13: qty 2

## 2011-03-13 MED ORDER — PROPOFOL 10 MG/ML IV EMUL
INTRAVENOUS | Status: DC | PRN
  Administered 2011-03-13: 70 mg via INTRAVENOUS
  Administered 2011-03-13: 200 mg via INTRAVENOUS

## 2011-03-13 SURGICAL SUPPLY — 13 items
ABLATOR ENDOMETRIAL BIPOLAR (ABLATOR) ×2 IMPLANT
CANISTER SUCTION 2500CC (MISCELLANEOUS) ×2 IMPLANT
CATH ROBINSON RED A/P 16FR (CATHETERS) ×2 IMPLANT
CLOTH BEACON ORANGE TIMEOUT ST (SAFETY) ×2 IMPLANT
CONTAINER PREFILL 10% NBF 60ML (FORM) ×3 IMPLANT
GLOVE BIO SURGEON STRL SZ 6.5 (GLOVE) ×4 IMPLANT
GOWN PREVENTION PLUS LG XLONG (DISPOSABLE) ×4 IMPLANT
GOWN STRL REIN XL XLG (GOWN DISPOSABLE) ×2 IMPLANT
NDL SPNL 20GX3.5 QUINCKE YW (NEEDLE) IMPLANT
NEEDLE SPNL 20GX3.5 QUINCKE YW (NEEDLE) ×2 IMPLANT
PACK HYSTEROSCOPY LF (CUSTOM PROCEDURE TRAY) ×2 IMPLANT
TOWEL OR 17X24 6PK STRL BLUE (TOWEL DISPOSABLE) ×4 IMPLANT
WATER STERILE IRR 1000ML POUR (IV SOLUTION) ×2 IMPLANT

## 2011-03-13 NOTE — Anesthesia Procedure Notes (Signed)
Procedure Name: LMA Insertion Date/Time: 03/13/2011 1:08 PM Performed by: Karleen Dolphin Pre-anesthesia Checklist: Suction available, Emergency Drugs available, Timeout performed, Patient identified and Patient being monitored Patient Re-evaluated:Patient Re-evaluated prior to inductionOxygen Delivery Method: Circle system utilized Preoxygenation: Pre-oxygenation with 100% oxygen Intubation Type: IV induction Ventilation: Mask ventilation without difficulty LMA: LMA inserted LMA Size: 4.0 Number of attempts: 1 Placement Confirmation: positive ETCO2 and breath sounds checked- equal and bilateral Tube secured with: Tape Dental Injury: Teeth and Oropharynx as per pre-operative assessment

## 2011-03-13 NOTE — H&P (Signed)
  Chief Complaint: 26 y.o. who presents with AUB.  Details of Present Illness: Long h/o menorrhagia.  W/U to date unremarkable.  BP 120/78  Pulse 76  Temp(Src) 98.4 F (36.9 C) (Oral)  Resp 16  Ht 5\' 7"  (1.702 m)  Wt 88.905 kg (196 lb)  BMI 30.70 kg/m2  SpO2 99%  Past Medical History  Diagnosis Date  . Headache     migraines  . Anxiety   . Depression   . Hidradenitis   . GERD (gastroesophageal reflux disease)     prilosec not reordered-  not taking x approx 3 weeks   History   Social History  . Marital Status: Married    Spouse Name: N/A    Number of Children: N/A  . Years of Education: N/A   Occupational History  . Not on file.   Social History Main Topics  . Smoking status: Current Everyday Smoker -- 0.5 packs/day for 16 years    Types: Cigarettes  . Smokeless tobacco: Not on file  . Alcohol Use: No  . Drug Use: No     [age] years old  . Sexually Active: Not Currently   Other Topics Concern  . Not on file   Social History Narrative  . No narrative on file   Family History  Problem Relation Age of Onset  . Cancer Maternal Aunt     breast  . Cancer Maternal Uncle     lung    Pertinent items are noted in HPI.  Pre-Op Diagnosis: AUB   Planned Procedure: Procedure(s): DILATATION & CURETTAGE/HYSTEROSCOPY WITH NOVASURE ABLATION  I have reviewed the patient's history and have completed the physical exam and Claire Caldwell is acceptable for surgery.  Roseanna Rainbow, MD 03/13/2011 12:27 PM

## 2011-03-13 NOTE — Op Note (Signed)
Preoperative diagnosis: dysfunctional uterine bleeding  Postoperative diagnosis: dysfunctional uterine bleeding  Procedure: Diagnostic hysteroscopy, dilatation and curettage, Novasure endometrial ablation.    Surgeon: Antionette Char A  Anesthesia: LMA, paracervical block  Estimated blood loss: Minimal  Urine output:  per Anesthesiology  IV Fluids:  per Anesthesiology  Complications: None  Specimen: PATHOLOGY  Operative Findings: Thin endometrial lining.  The Essure coils were not seen.   A good result was noted after the ablation cycle was completed.  Description of procedure:   The patient was taken to the operating room and placed on the operating table in the semi-lithotomy position in [place] stirrups.  Examination under anesthesia was performed.  The patient was prepped and draped in the usual manner.  After a time-out had been completed, a speculum was placed in the vagina.  The anterior lip of the cervix was grasped with a single-toothed tenaculum.  The endocervical canal sounded to 3 cm.  The uterine cavity sounded to 8 cm.  The endocervical canal was dilated with Shawnie Pons dilators.  A 5 mm diagnostic hysteroscope with Glycine as the distending medium was used to perform a diagnostic hysteroscopy.  The hysteroscope was removed.  A small, Sims curette was used to perform an endometrial curettage.  The cervical canal was further dilated with Pratt dilators to a #29.  The Novasure device was test-fired and the meter went between 4.5 and 5.  The device was withdrawn into the cylinder and placed into the uterus.  The dorsal fin was set appropriately.  The device was deployed and then seated.  The sleeve was placed up against the endocervix.  A cavity assessment test was performed and passed.  The ablation cycle was completed.  The device was removed and another hysteroscopic examination was performed.  A good result was noted.  All the instruments were removed from the vagina.  Final  instrument counts were correct.  The patient was taken to the PACU in stable condition.

## 2011-03-13 NOTE — Anesthesia Preprocedure Evaluation (Signed)
Anesthesia Evaluation  Patient identified by MRN, date of birth, ID band Patient awake    Reviewed: Allergy & Precautions, H&P , Patient's Chart, lab work & pertinent test results, reviewed documented beta blocker date and time   History of Anesthesia Complications Negative for: history of anesthetic complications  Airway Mallampati: II TM Distance: >3 FB Neck ROM: full    Dental No notable dental hx.    Pulmonary neg pulmonary ROS,  breath sounds clear to auscultation  Pulmonary exam normal       Cardiovascular Exercise Tolerance: Good negative cardio ROS  + Valvular Problems/Murmurs Rhythm:regular Rate:Normal     Neuro/Psych negative neurological ROS  negative psych ROS   GI/Hepatic negative GI ROS, Neg liver ROS, GERD-  ,  Endo/Other  negative endocrine ROS  Renal/GU negative Renal ROS     Musculoskeletal   Abdominal   Peds  Hematology negative hematology ROS (+)   Anesthesia Other Findings DEPRESSION    ACID REFLUX DISEASE    THREATENED ABORTION, ANTEPARTUM    SYNCOPE AND COLLAPSE    DIZZINESS    PALPITATIONS    CARDIAC MURMUR    NAUSEA AND VOMITING    NERVOUSNESS    Sterilization    Hidradenitis-bil groins    Reproductive/Obstetrics negative OB ROS                           Anesthesia Physical Anesthesia Plan  ASA: II  Anesthesia Plan: General LMA   Post-op Pain Management:    Induction:   Airway Management Planned:   Additional Equipment:   Intra-op Plan:   Post-operative Plan:   Informed Consent: I have reviewed the patients History and Physical, chart, labs and discussed the procedure including the risks, benefits and alternatives for the proposed anesthesia with the patient or authorized representative who has indicated his/her understanding and acceptance.   Dental Advisory Given  Plan Discussed with: CRNA, Surgeon and Anesthesiologist  Anesthesia Plan  Comments:         Anesthesia Quick Evaluation

## 2011-03-13 NOTE — Anesthesia Postprocedure Evaluation (Signed)
  Anesthesia Post-op Note  Patient: Claire Caldwell  Procedure(s) Performed: Procedure(s) (LRB): DILATATION & CURETTAGE/HYSTEROSCOPY WITH NOVASURE ABLATION (N/A)  Patient is awake and responsive. Pain and nausea are reasonably well controlled. Vital signs are stable and clinically acceptable. Oxygen saturation is clinically acceptable. There are no apparent anesthetic complications at this time. Patient is ready for discharge.

## 2011-03-13 NOTE — Discharge Instructions (Signed)
D&C/Endometrial Ablation Care After Read the instructions below. Refer to this sheet in the next few weeks. These instructions provide you with general information on caring for yourself after you leave the hospital. Your caregiver may also give you specific instructions.  A D&C/endometrial ablation  is a minor operation. A D&C involves the stretching (dilatation) of the cervix and scraping (curettage) of the inside lining of the uterus. Endometrial ablation (EA) is a surgery that makes a woman's period much lighter or stops it completely.  You may have light cramping and bleeding for a couple of days to two weeks after the procedure. This procedure may be done in a hospital, outpatient clinic, or doctor's office. You may be given a drug to make you sleep (general anesthetic) or a drug that numbs the area (local anesthetic) in and around the cervix. HOME CARE INSTRUCTIONS  Do not drive for 24 hours.   Wait one week before returning to strenuous activities.   Take your temperature two times a day for 4 days and write it down. Provide these temperatures to your caregiver if they are abnormal (above 98.6 F or 37.0 C).   Avoid long periods of standing, and do no heavy lifting (more than 10 pounds), pushing or pulling.   Limit stair climbing to once or twice a day.   Take rest periods often.   You may resume your usual diet.   Drink plenty of fluids (6-8 glasses a day).   You should return to your usual bowel function. If constipation should occur, you may:   Take a mild laxative with permission from your caregiver.   Add fruit and bran to your diet.   Drink more fluids. This helps with constipation.   Take showers instead of baths until your caregiver gives you permission to take baths.   Do not go swimming or use a hot tub until your caregiver gives you permission.   Try to have someone with you or available for you the first 24 to 48 hours, especially if you had a general  anesthetic.   Do not douche, use tampons, or have intercourse until after your follow-up appointment, or when your caregiver approves.   Only take over-the-counter or prescription medicines for pain, discomfort, or fever as directed by your caregiver. Do not take aspirin. It can cause bleeding.   If a prescription was given, follow your caregiver's directions. You may be given a medicine that kills germs (antibiotic) to prevent an infection.   Keep all your follow-up appointments recommended by your caregiver.  SEEK MEDICAL CARE IF:  You have increasing cramps or pain not relieved with medication.   You develop belly (abdominal) pain which does not seem to be related to the same area of earlier cramping and pain.   You feel dizzy or feel like fainting.   You have bad smelling vaginal discharge.   You develop a rash.   You develop a reaction or allergy to your medication.  SEEK IMMEDIATE MEDICAL CARE IF:  Bleeding is heavier than a normal menstrual period.   You have an oral temperature above 100.6, not controlled by medicine.   You develop chest pain.   You develop shortness of breath.   You pass out.   You develop pain in your shoulder strap area.   You develop heavy vaginal bleeding with or without blood clots.  MAKE SURE YOU:   Understand these instructions.   Will watch your condition.   Will get help right away if   you are not doing well or get worse.  Document Released: 12/20/1999 Document Re-Released: 06/11/2009 ExitCare Patient Information 2011 [employer].  

## 2011-03-13 NOTE — Transfer of Care (Signed)
Immediate Anesthesia Transfer of Care Note  Patient: Claire Caldwell  Procedure(s) Performed: Procedure(s) (LRB): DILATATION & CURETTAGE/HYSTEROSCOPY WITH NOVASURE ABLATION (N/A)  Patient Location: PACU  Anesthesia Type: General  Level of Consciousness: awake, alert  and oriented  Airway & Oxygen Therapy: Patient Spontanous Breathing and Patient connected to nasal cannula oxygen  Post-op Assessment: Report given to PACU RN and Post -op Vital signs reviewed and stable  Post vital signs: Reviewed and stable  Complications: No apparent anesthesia complications

## 2011-03-18 ENCOUNTER — Ambulatory Visit (INDEPENDENT_AMBULATORY_CARE_PROVIDER_SITE_OTHER): Payer: Medicaid Other | Admitting: Surgery

## 2011-03-23 ENCOUNTER — Encounter: Payer: Self-pay | Admitting: Physical Medicine & Rehabilitation

## 2011-03-23 ENCOUNTER — Encounter: Payer: Medicaid Other | Attending: Physical Medicine & Rehabilitation

## 2011-03-23 ENCOUNTER — Ambulatory Visit (HOSPITAL_BASED_OUTPATIENT_CLINIC_OR_DEPARTMENT_OTHER): Payer: Medicaid Other | Admitting: Physical Medicine & Rehabilitation

## 2011-03-23 VITALS — BP 141/68 | HR 70 | Resp 14 | Ht 67.0 in | Wt 194.0 lb

## 2011-03-23 DIAGNOSIS — M76899 Other specified enthesopathies of unspecified lower limb, excluding foot: Secondary | ICD-10-CM | POA: Insufficient documentation

## 2011-03-23 DIAGNOSIS — M545 Low back pain: Secondary | ICD-10-CM

## 2011-03-23 DIAGNOSIS — M7062 Trochanteric bursitis, left hip: Secondary | ICD-10-CM | POA: Insufficient documentation

## 2011-03-23 NOTE — Progress Notes (Signed)
Subjective:    Patient ID: Claire Caldwell, female    DOB: Oct 16, 1985, 26 y.o.   MRN: [id number]  HPI  Three-year history of back pain primarily. Also has pain in the left hip going down into the toes. Has gone to physical therapy in October 2012 3-4 sessions and has continued with a home exercise program. MRI performed at [facility] radiology in December of 2012 was normal. Has been evaluated by sports medicine orthopedic clinic. Pain Inventory Average Pain 7 Pain Right Now 7 My pain is constant, burning, tingling and aching  In the last 24 hours, has pain interfered with the following? General activity 7 Relation with others 5 Enjoyment of life 8 What TIME of day is your pain at its worst? night Sleep (in general) Poor  Pain is worse with: walking Pain improves with: medication Relief from Meds: 4  Mobility walk without assistance how many minutes can you walk? 20 ability to climb steps?  yes do you drive?  yes  Function not employed: date last employed   Neuro/Psych numbness trouble walking  Prior Studies x-rays CT/MRI  Physicians involved in your care Orthopedist       Review of Systems  Constitutional: Negative.   HENT: Negative.   Eyes: Negative.   Respiratory: Negative.   Cardiovascular: Negative.   Gastrointestinal: Negative.   Genitourinary: Negative.   Musculoskeletal: Negative.   Skin: Negative.   Neurological: Positive for numbness.  Hematological: Negative.   Psychiatric/Behavioral: Negative.        Objective:   Physical Exam  Constitutional: She is oriented to person, place, and time. She appears well-developed.  Neck: Normal range of motion.  Cardiovascular: Normal rate, regular rhythm and normal heart sounds.   Pulmonary/Chest: Effort normal and breath sounds normal.  Abdominal: Bowel sounds are normal.  Musculoskeletal:       Right hip: She exhibits bony tenderness.       Left hip: She exhibits bony tenderness.       Right  knee: Normal.       Left knee: Normal.       Lumbar back: Normal.       Tenderness to palpation over the left greater than right trochanter bursa  Neurological: She is alert and oriented to person, place, and time. She has normal strength. No sensory deficit. Gait normal.  Reflex Scores:      Tricep reflexes are 2+ on the right side and 2+ on the left side.      Bicep reflexes are 2+ on the right side and 2+ on the left side.      Brachioradialis reflexes are 2+ on the right side and 2+ on the left side.      Patellar reflexes are 2+ on the right side and 2+ on the left side.      Achilles reflexes are 2+ on the right side and 2+ on the left side. Psychiatric: She has a normal mood and affect. Her behavior is normal. Judgment and thought content normal.          Assessment & Plan:  1. Chronic low back pain with normal MRI. I discussed with the patient that this is a positive finding in that her risk for any type of serious back problem is very very low. We discussed other potential pain generators including the sacroiliac joint and lumbar facet joints. We discussed that we would need to do anesthetic injections to help diagnose these conditions. We would start with sacroiliac injection given that  she had complaints of back pain during pregnancy. 2. Left hip trochanteric bursitis we'll do an injection today. She may need some physical therapy for tensor fascia lata stretching and hip strengthening.  Aspiration/Injection Procedure Note Claire Caldwell [id number] 1985/11/29  Procedure: Injection Indications: Left trochanteric bursitis  Procedure Details Consent: Risks of procedure as well as the alternatives and risks of each were explained to the (patient/caregiver).  Consent for procedure obtained. Time Out: Verified patient identification, verified procedure, site/side was marked, verified correct patient position, special equipment/implants available, medications/allergies/relevent  history reviewed, required imaging and test results available.  Performed   Local Anesthesia Used:Lidocaine 1% plain; 4mL Amount of Fluid Aspirated: minimal amount Character of Fluid: Not applicable Fluid was sent for:Not applicable A sterile dressing was applied. 1 cc of 40 mg per mL Depo-Medrol injected Patient did tolerate procedure well. Estimated blood loss: Nil  Claire Caldwell 03/23/2011, 2:43 PM

## 2011-03-23 NOTE — Patient Instructions (Signed)

## 2011-03-24 ENCOUNTER — Ambulatory Visit (INDEPENDENT_AMBULATORY_CARE_PROVIDER_SITE_OTHER): Payer: Medicaid Other | Admitting: Surgery

## 2011-03-24 ENCOUNTER — Telehealth: Payer: Self-pay | Admitting: Physical Medicine & Rehabilitation

## 2011-03-24 ENCOUNTER — Encounter (INDEPENDENT_AMBULATORY_CARE_PROVIDER_SITE_OTHER): Payer: Self-pay | Admitting: Surgery

## 2011-03-24 VITALS — BP 136/88 | HR 76 | Temp 97.9°F | Resp 16 | Ht 67.0 in | Wt 187.2 lb

## 2011-03-24 DIAGNOSIS — L732 Hidradenitis suppurativa: Secondary | ICD-10-CM

## 2011-03-24 MED ORDER — HYDROCODONE-ACETAMINOPHEN 5-325 MG PO TABS: 1.0000 | ORAL_TABLET | ORAL | Status: AC | PRN

## 2011-03-24 NOTE — Telephone Encounter (Signed)
Pt aware.

## 2011-03-24 NOTE — Telephone Encounter (Signed)
It may take up to 7 days for the cortisone to kick in

## 2011-03-24 NOTE — Telephone Encounter (Signed)
Patent had injection in L hip yesterday.  Is it normal to hurt so bad?  Pain is from inj site down L thigh.

## 2011-03-24 NOTE — Progress Notes (Signed)
The patient had a flare-up of her hidradenitis a couple of weeks ago.  She had a couple of small areas rupture and drain.  They are all healed up now.  Cipro seems to shorten the course of infection.  She recently had a D&C and is recovering from that.    Both groins seems to be well-healed with no sign of abscess or cellulitis.  No tenderness.  I encouraged her to remove as much hair as possible from her groins while there is no active infection.  She has not had laser hair removal by Dr. Kelly Splinter because of financial reasons.  I gave her some refills of Cipro to keep on hand, as well as refill of Vicodin.  Follow-up PRN.  Wilmon Arms. Corliss Skains, MD, [facility]  03/24/2011 10:41 AM

## 2011-03-25 ENCOUNTER — Encounter: Payer: Self-pay | Admitting: Physical Medicine & Rehabilitation

## 2011-04-01 ENCOUNTER — Encounter (INDEPENDENT_AMBULATORY_CARE_PROVIDER_SITE_OTHER): Payer: Self-pay | Admitting: General Surgery

## 2011-04-01 ENCOUNTER — Ambulatory Visit (INDEPENDENT_AMBULATORY_CARE_PROVIDER_SITE_OTHER): Payer: Medicaid Other | Admitting: General Surgery

## 2011-04-01 VITALS — BP 144/90 | HR 76 | Temp 97.6°F | Resp 18 | Ht 67.0 in | Wt 186.0 lb

## 2011-04-01 DIAGNOSIS — N764 Abscess of vulva: Secondary | ICD-10-CM

## 2011-04-01 MED ORDER — OXYCODONE-ACETAMINOPHEN 10-325 MG PO TABS: 1.0000 | ORAL_TABLET | Freq: Four times a day (QID) | ORAL | Status: DC | PRN

## 2011-04-01 NOTE — Progress Notes (Signed)
Subjective:     Patient ID: Claire Caldwell, female   DOB: 01-25-1985, 26 y.o.   MRN: [id number]  HPI This is a 26 year old female with multiple episodes of hidradenitis in her groin. She has had surgery in the past before. She has been maintained on antibiotics for some time. She has also seen plastic surgery for evaluation who recommended laser hair removal. She has not been able to do this due to financial constraints. She returns today with a several day history of a right labial abscess. This area is not draining on its own it has become increasingly tender. It is not getting better she requested evaluation.  Review of Systems     Objective:   Physical Exam  Genitourinary:          Assessment:     Right labial abscess    Plan:     We discussed incising and draining this abscess. I cleansed it. I then numbed with 1% lidocaine. I then made in the cruciate incision overlying this area and drain it completely. There was a small amount of purulent and a small cavity present. I packed this in place a dressing over it. A letter of the packing in 48 hours. I told her if it falls out she can just take tub baths. She'll come back to see Dr. Corliss Skains for followup.

## 2011-04-13 ENCOUNTER — Telehealth (INDEPENDENT_AMBULATORY_CARE_PROVIDER_SITE_OTHER): Payer: Self-pay | Admitting: General Surgery

## 2011-04-13 MED ORDER — CIPROFLOXACIN HCL 500 MG PO TABS: 500.0000 mg | ORAL_TABLET | Freq: Two times a day (BID) | ORAL | Status: AC

## 2011-04-13 NOTE — Telephone Encounter (Signed)
Left message for patient to call back. Cipro 500 mg BID sent to pharmacy.

## 2011-04-13 NOTE — Telephone Encounter (Signed)
Message copied by Liliana Cline on Mon Apr 13, 2011  1:39 PM ------      Message from: Larry Sierras      Created: Mon Apr 13, 2011  8:42 AM      Contact: [phone]       PLEASE CALL PT FOR REFILL OF ANTIBIOTIC RX  THANKS GM

## 2011-04-14 ENCOUNTER — Telehealth: Payer: Self-pay | Admitting: Physical Medicine & Rehabilitation

## 2011-04-14 NOTE — Telephone Encounter (Signed)
Patient having injection on Thursday, but having really bad pain.  Can Dr call in something to get her thru to Thursday?

## 2011-04-15 MED ORDER — MELOXICAM 15 MG PO TABS: 15.0000 mg | ORAL_TABLET | Freq: Every day | ORAL | Status: DC

## 2011-04-15 NOTE — Telephone Encounter (Signed)
Please advise 

## 2011-04-15 NOTE — Telephone Encounter (Signed)
May: Mobic 15 mg #7 no refills take 1 per day

## 2011-04-15 NOTE — Telephone Encounter (Signed)
Pt aware that rx has been sent in. 

## 2011-04-16 ENCOUNTER — Encounter: Payer: Self-pay | Admitting: Physical Medicine & Rehabilitation

## 2011-04-16 ENCOUNTER — Encounter: Payer: Medicaid Other | Attending: Physical Medicine & Rehabilitation

## 2011-04-16 ENCOUNTER — Ambulatory Visit (HOSPITAL_BASED_OUTPATIENT_CLINIC_OR_DEPARTMENT_OTHER): Payer: Medicaid Other | Admitting: Physical Medicine & Rehabilitation

## 2011-04-16 VITALS — BP 143/68 | HR 81 | Ht 67.0 in | Wt 186.0 lb

## 2011-04-16 DIAGNOSIS — M533 Sacrococcygeal disorders, not elsewhere classified: Secondary | ICD-10-CM

## 2011-04-16 DIAGNOSIS — M76899 Other specified enthesopathies of unspecified lower limb, excluding foot: Secondary | ICD-10-CM | POA: Insufficient documentation

## 2011-04-16 MED ORDER — CYCLOBENZAPRINE HCL 5 MG PO TABS: 5.0000 mg | ORAL_TABLET | Freq: Three times a day (TID) | ORAL | Status: AC | PRN

## 2011-04-16 NOTE — Patient Instructions (Signed)
Sacroiliac Joint Dysfunction The sacroiliac joint connects the lower part of the spine (the sacrum) with the bones of the pelvis. CAUSES  Sometimes, there is no obvious reason for sacroiliac joint dysfunction. Other times, it may occur   During pregnancy.   After injury, such as:   Car accidents.   Sport-related injuries.   Work-related injuries.   Due to one leg being shorter than the other.   Due to other conditions that affect the joints, such as:   Rheumatoid arthritis.   Gout.   Psoriasis.   Joint infection (septic arthritis).  SYMPTOMS  Symptoms may include:  Pain in the:   Lower back.   Buttocks.   Groin.   Thighs and legs.   Difficult sitting, standing, walking, lying, bending or lifting.  DIAGNOSIS  A number of tests may be used to help diagnose the cause of sacroiliac joint dysfunction, including:  Imaging tests to look for other causes of pain, including:   MRI.   CT scan.   Bone scan.   Diagnostic injection: During a special x-ray (called fluoroscopy), a needle is put into the sacroiliac joint. A numbing medicine is injected into the joint. If the pain is improved or stopped, the diagnosis of sacroiliac joint dysfunction is more likely.  TREATMENT  There are a number of types of treatment used for sacroiliac joint dysfunction, including:  Only take over-the-counter or prescription medicines for pain, discomfort, or fever as directed by your caregiver.   Medications to relax muscles.   Rest. Decreasing activity can help cut down on painful muscle spasms and allow the back to heal.   Application of heat or ice to the lower back may improve muscle spasms and soothe pain.   Brace. A special back brace, called a sacroiliac belt, can help support the joint while your back is healing.   Physical therapy can help teach comfortable positions and exercises to strengthen muscles that support the sacroiliac joint.   Cortisone injections. Injections  of steroid medicine into the joint can help decrease swelling and improve pain.   Hyaluronic acid injections. This chemical improves lubrication within the sacroiliac joint, thereby decreasing pain.   Radiofrequency ablation. A special needle is placed into the joint, where it burns away nerves that are carrying pain messages from the joint.   Surgery. Because pain occurs during movement of the joint, screws and plates may be installed in order to limit or prevent joint motion.  HOME CARE INSTRUCTIONS   Take all medications exactly as directed.   Follow instructions regarding both rest and physical activity, to avoid worsening the pain.   Do physical therapy exercises exactly as prescribed.  SEEK IMMEDIATE MEDICAL CARE IF:  You experience increasingly severe pain.   You develop new symptoms, such as numbness or tingling in your legs or feet.   You lose bladder or bowel control.  Document Released: 03/20/2008 Document Revised: 12/11/2010 Document Reviewed: 03/20/2008 Exit[employer] Patient Information 2012 [employer]. 

## 2011-04-16 NOTE — Progress Notes (Signed)
  PROCEDURE RECORD [facility]   Name: Claire Caldwell DOB:09/14/1985 MRN: [id number]  Date:04/16/2011  Physician: Claudette Laws, MD    Nurse/CMA: Noralyn Pick, CMA  Allergies: No Known Allergies  Consent Signed: yes  Is patient diabetic? no    Pregnant: no LMP: No LMP recorded. Patient is not currently having periods (Reason: Lactating). (age 26-55)  Anticoagulants: no Anti-inflammatory: no Antibiotics: no  Procedure: Sacroiliac Injection  Position: Prone Start Time: 11:45am  End Time: 11:49am  Fluoro Time: 17 seconds  RN/CMA Noralyn Pick, CMA Carroll,CMA    Time 11:15am 11:51am    BP 143/68 142/70    Pulse 81 79    Respirations 16 14    O2 Sat 93% 96%    S/S 6 6    Pain Level 8/10 5/10     D/C home with [name] (husband), patient A & O X 3, D/C instructions reviewed, and sits independently.

## 2011-04-16 NOTE — Progress Notes (Signed)
Left sacroiliac injection under fluoroscopic guidance  Indication: Left Low back and buttocks pain not relieved by medication management and other conservative care.  Informed consent was obtained after describing risks and benefits of the procedure with the patient, this includes bleeding, bruising, infection, paralysis and medication side effects. The patient wishes to proceed and has given written consent. The patient was placed in a prone position. The lumbar and sacral area was marked and prepped with Betadine. A 25-gauge 1-1/2 inch needle was inserted into the skin and subcutaneous tissue and 1 mL of 1% lidocaine was injected. Then a 25-gauge 3 inch spinal needle was inserted under fluoroscopic guidance into the left sacroiliac joint. AP and lateral images were utilized. Omnipaque 180x0.5 mL under live fluoroscopy demonstrated no intravascular uptake. Then a solution containing one ML of 40 mg per mL depomedrol and 2 ML of 1% lidocaine MPF was injected x1.5 mL. Patient tolerated the procedure well. Post procedure instructions were given. Please see post procedure form. 

## 2011-04-20 ENCOUNTER — Telehealth (INDEPENDENT_AMBULATORY_CARE_PROVIDER_SITE_OTHER): Payer: Self-pay

## 2011-04-20 NOTE — Telephone Encounter (Signed)
Ms. Penland called requesting a refill on her Percocet 10 mg's she was given on 04/01/11, she has a follow up appointment on 05/01/11.  Patient states her hidradenitis in her groin and buttock area are red, swollen with moderate inflammation.  I offered Ms. Cyndie Chime 2 different appointment dates with Dr. Corliss Skains for this week but she states she has other obligations and cannot make it.  Patient has a Pain Management Physician that she's seeing as well.  Please advise on medication refill.

## 2011-04-21 ENCOUNTER — Other Ambulatory Visit (INDEPENDENT_AMBULATORY_CARE_PROVIDER_SITE_OTHER): Payer: Self-pay | Admitting: Surgery

## 2011-04-21 ENCOUNTER — Telehealth (INDEPENDENT_AMBULATORY_CARE_PROVIDER_SITE_OTHER): Payer: Self-pay | Admitting: General Surgery

## 2011-04-21 MED ORDER — OXYCODONE-ACETAMINOPHEN 10-650 MG PO TABS: 1.0000 | ORAL_TABLET | Freq: Four times a day (QID) | ORAL | Status: DC | PRN

## 2011-04-21 NOTE — Telephone Encounter (Signed)
Patient's appt moved to Dr Corliss Skains. I offered appt this week but she could not make. I made appt for 05/06/11 @ 3:40pm with Dr Corliss Skains. Patient inquired about refill request. Made her aware I would take this to Dr Corliss Skains as well.

## 2011-04-21 NOTE — Telephone Encounter (Signed)
Message copied by Liliana Cline on Tue Apr 21, 2011 10:33 AM ------      Message from: Dwain Sarna, MATTHEW      Created: Mon Apr 20, 2011  8:55 PM       Rechel,      This patient is scheduled to see me on the 26th.  She should be seeing Tsuei.  Christy sent me a message about her not being able to come in.  I am not filling any of her meds and will see her only in urgent office.  She can come back in and see Dr. Corliss Skains.  Please take her off that day.  I am LDOW that week and after she is off no more that day please.      Thank you       MW

## 2011-05-01 ENCOUNTER — Encounter (INDEPENDENT_AMBULATORY_CARE_PROVIDER_SITE_OTHER): Payer: Medicaid Other | Admitting: General Surgery

## 2011-05-04 ENCOUNTER — Encounter: Payer: Self-pay | Admitting: Physical Medicine & Rehabilitation

## 2011-05-04 ENCOUNTER — Ambulatory Visit (HOSPITAL_BASED_OUTPATIENT_CLINIC_OR_DEPARTMENT_OTHER): Payer: Medicaid Other | Admitting: Physical Medicine & Rehabilitation

## 2011-05-04 VITALS — BP 136/84 | HR 72 | Resp 14 | Ht 67.0 in | Wt 186.0 lb

## 2011-05-04 DIAGNOSIS — M533 Sacrococcygeal disorders, not elsewhere classified: Secondary | ICD-10-CM

## 2011-05-04 NOTE — Progress Notes (Signed)
  PROCEDURE RECORD [facility]   Name: Claire Caldwell DOB:February 20, 1985 MRN: [id number]  Date:05/04/2011  Physician: Claudette Laws, MD    Nurse/CMA: Redgie Grayer  Allergies:  Allergies  Allergen Reactions  . Meloxicam     Consent Signed: yes  Is patient diabetic? no   Pregnant: no LMP: No LMP recorded. Patient is not currently having periods (Reason: Lactating). (age 26-55)  Anticoagulants: no Anti-inflammatory: no Antibiotics: no  Procedure: Sacroiliac Injection  Position: Prone Start Time: 1:33pm  End Time: 1:40pm  Fluoro Time: 15 seconds  RN/CMA Levens, CMA Carroll,CMA    Time 1:15pm 1:41pm    BP 136/84 137/75    Pulse 72 66    Respirations 14 14    O2 Sat 97% 98%    S/S 6 6    Pain Level 7/10 5/10     D/C home with [name] (husband), patient A & O X 3, D/C instructions reviewed, and sits independently.

## 2011-05-04 NOTE — Patient Instructions (Signed)
Keep track of your pain once again. If it goes away for only a short period of time, there is another procedure called radiofrequency neurotomy that may be beneficial for a longer period of time. Keep in mind that we are doing both sides today and I would expect both the right side and a left-sided pain to respond.

## 2011-05-04 NOTE — Progress Notes (Signed)
Bilateral sacroiliac injections under fluoroscopic guidance  Indication: Low back and buttocks pain not relieved by medication management and other conservative care.  Informed consent was obtained after describing risks and benefits of the procedure with the patient, this includes bleeding, bruising, infection, paralysis and medication side effects. The patient wishes to proceed and has given written consent. The patient was placed in a prone position. The lumbar and sacral area was marked and prepped with Betadine. A 25-gauge 1-1/2 inch needle was inserted into the skin and subcutaneous tissue and 1 mL of 1% lidocaine was injected into each side. Then a 25-gauge 3 inch spinal needle was inserted under fluoroscopic guidance into the left sacroiliac joint. AP and lateral images were utilized. Omnipaque 180x0.5 mL under live fluoroscopy demonstrated no intravascular uptake. Then a solution containing one ML of 40 mg per mL Depakote met drawl in 2 ML of 2% lidocaine MPF was injected x1.5 mL. This same procedure was repeated on the right side using the same needle, injectate, and technique. Patient tolerated the procedure well. Post procedure instructions were given. Please see post procedure form. 

## 2011-05-06 ENCOUNTER — Ambulatory Visit (INDEPENDENT_AMBULATORY_CARE_PROVIDER_SITE_OTHER): Payer: Medicaid Other | Admitting: Surgery

## 2011-05-06 ENCOUNTER — Encounter (INDEPENDENT_AMBULATORY_CARE_PROVIDER_SITE_OTHER): Payer: Self-pay | Admitting: Surgery

## 2011-05-06 VITALS — BP 130/80 | HR 87 | Temp 97.8°F | Ht 67.0 in | Wt 186.8 lb

## 2011-05-06 DIAGNOSIS — L732 Hidradenitis suppurativa: Secondary | ICD-10-CM

## 2011-05-06 MED ORDER — OXYCODONE-ACETAMINOPHEN 10-650 MG PO TABS: 1.0000 | ORAL_TABLET | Freq: Four times a day (QID) | ORAL | Status: DC | PRN

## 2011-05-06 MED ORDER — CIPROFLOXACIN HCL 500 MG PO TABS: 500.0000 mg | ORAL_TABLET | Freq: Two times a day (BID) | ORAL | Status: AC

## 2011-05-06 NOTE — Progress Notes (Signed)
The patient returns for evaluation of her groin hidradenitis. She had a right groin abscess drained by Dr. Dwain Sarna about a month ago. This area is completely healed. Last week she had a small abscess in her left groin but this spontaneously drained and is now completely healed. She also has a small abscess in the right perirectal region.  Filed Vitals:   05/06/11 1524  BP: 130/80  Pulse: 87  Temp: 97.8 F (36.6 C)    Both groins show no sign of inflammation or abscess. No drainage noted. No tenderness. In the right perirectal region there is a small superficial abscess that had spontaneously burst and drain. The abscess measures about a centimeter and a half across. No surrounding cellulitis. I encouraged her to use sitz baths until this is healed.  I refilled her Cipro prescription as well as her pain prescription. She is using a pain medicine both for her chronic hip arthritis as well as her hidradenitis. We will see her as needed.  Wilmon Arms. Corliss Skains, MD, [facility]  05/06/2011 5:38 PM

## 2011-05-25 ENCOUNTER — Telehealth: Payer: Self-pay

## 2011-05-25 NOTE — Telephone Encounter (Signed)
Pt needs something for pain between injections.

## 2011-05-25 NOTE — Telephone Encounter (Signed)
Call in tramadol 50 mg by mouth 3 times a day #90 no refills

## 2011-05-25 NOTE — Telephone Encounter (Signed)
See call 5/20

## 2011-05-26 MED ORDER — TRAMADOL HCL 50 MG PO TABS: 50.0000 mg | ORAL_TABLET | Freq: Three times a day (TID) | ORAL | Status: AC

## 2011-05-26 NOTE — Telephone Encounter (Signed)
Addended by: Judd Gaudier on: 05/26/2011 11:29 AM   Modules accepted: Orders

## 2011-06-04 ENCOUNTER — Encounter: Payer: Self-pay | Admitting: Physical Medicine & Rehabilitation

## 2011-06-04 ENCOUNTER — Ambulatory Visit (HOSPITAL_BASED_OUTPATIENT_CLINIC_OR_DEPARTMENT_OTHER): Payer: Medicaid Other | Admitting: Physical Medicine & Rehabilitation

## 2011-06-04 ENCOUNTER — Encounter (INDEPENDENT_AMBULATORY_CARE_PROVIDER_SITE_OTHER): Payer: Self-pay | Admitting: Surgery

## 2011-06-04 ENCOUNTER — Ambulatory Visit (INDEPENDENT_AMBULATORY_CARE_PROVIDER_SITE_OTHER): Payer: Medicaid Other | Admitting: Surgery

## 2011-06-04 ENCOUNTER — Encounter: Payer: Medicaid Other | Attending: Physical Medicine & Rehabilitation

## 2011-06-04 VITALS — BP 112/84 | HR 78 | Temp 98.3°F | Resp 16 | Ht 67.0 in | Wt 180.4 lb

## 2011-06-04 VITALS — BP 130/86 | HR 77 | Resp 14 | Ht 67.0 in | Wt 180.0 lb

## 2011-06-04 DIAGNOSIS — L732 Hidradenitis suppurativa: Secondary | ICD-10-CM

## 2011-06-04 DIAGNOSIS — M533 Sacrococcygeal disorders, not elsewhere classified: Secondary | ICD-10-CM

## 2011-06-04 DIAGNOSIS — M76899 Other specified enthesopathies of unspecified lower limb, excluding foot: Secondary | ICD-10-CM | POA: Insufficient documentation

## 2011-06-04 MED ORDER — GABAPENTIN 600 MG PO TABS: 600.0000 mg | ORAL_TABLET | Freq: Three times a day (TID) | ORAL | Status: DC

## 2011-06-04 MED ORDER — CIPROFLOXACIN HCL 500 MG PO TABS: 500.0000 mg | ORAL_TABLET | Freq: Two times a day (BID) | ORAL | Status: AC

## 2011-06-04 NOTE — Patient Instructions (Signed)
You'll be sore for up to 4 weeks after the injection. He will take gabapentin 3 tablets after you get home. I will see you back in 4 weeks to see how this helps you.

## 2011-06-04 NOTE — Progress Notes (Signed)
  PROCEDURE RECORD [facility]   Name: DASJA BRASE DOB:26-Apr-1985 MRN: [id number]  Date:06/04/2011  Physician: Claudette Laws, MD    Nurse/CMA: Kelli Churn, RN  Allergies:  Allergies  Allergen Reactions  . Meloxicam   . Tramadol     Headache    Consent Signed: yes  Is patient diabetic? no  CBG today?   Pregnant: no LMP: No LMP recorded. Patient is not currently having periods (Reason: Lactating). (age 26-55)  Anticoagulants: no Anti-inflammatory: no Antibiotics: no  Procedure: Left Radiofrequency Neurotomy  Position: Prone Start Time: 11:38  End Time: 11:59  Fluoro Time: 20 sec  RN/CMA Saben Donigan,CMA Shumaker RN    Time 10:49 12:06    BP 130/86 135/78    Pulse 77 75    Respirations 14 16    O2 Sat 96 98%    S/S 6 6    Pain Level 7 Unable to identify     D/C home with Husband-[name], patient A & O X 3, D/C instructions reviewed, and sits independently.

## 2011-06-04 NOTE — Progress Notes (Signed)
The patient returns for evaluation of her recurrent groin hidradenitis.  She currently has not active infections.  Last week, she had a small abscess in the right buttock spontaneously rupture.  She also had a small left groin abscess 2 weeks ago which spontaneously ruptured and has now resolved.    Filed Vitals:   06/04/11 0928  BP: 112/84  Pulse: 78  Temp: 98.3 F (36.8 C)  Resp: 16    Both groins are free of any active abscesses or draining sinuses.  She is keeping the hair shaved in her pubic region.  She has lost significant weight as well.  The right buttock shows a healed scar which was the area of the recently ruptured abscess.  Wilmon Arms. Corliss Skains, MD, [facility]  06/04/2011 12:19 PM

## 2011-06-04 NOTE — Progress Notes (Signed)
  Subjective:    Patient ID: Claire Caldwell, female    DOB: [birth year], 26 y.o.   MRN: [id number]  HPI  Pain Inventory Average Pain 7 Pain Right Now 7 My pain is burning, stabbing, tingling and aching  In the last 24 hours, has pain interfered with the following? General activity 6 Relation with others 6 Enjoyment of life 7 What TIME of day is your pain at its worst? evening Sleep (in general) Fair  Pain is worse with: walking, bending, inactivity, standing and some activites Pain improves with: medication and injections Relief from Meds: 7  Mobility walk without assistance how many minutes can you walk? 20 min ability to climb steps?  yes do you drive?  yes Do you have any goals in this area?  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  no  Physicians involved in your care Any changes since last visit?  no   Family History  Problem Relation Age of Onset  . Cancer Maternal Aunt     breast  . Cancer Maternal Uncle     lung   History   Social History  . Marital Status: Married    Spouse Name: N/A    Number of Children: N/A  . Years of Education: N/A   Social History Main Topics  . Smoking status: Current Everyday Smoker -- 0.5 packs/day for 16 years    Types: Cigarettes  . Smokeless tobacco: None  . Alcohol Use: No  . Drug Use: No     51-54 years old  . Sexually Active: Not Currently   Other Topics Concern  . None   Social History Narrative  . None   Past Surgical History  Procedure Date  . Tonsillectomy     & adenoids as a child  . Ear cyst excision 2007    bil ears  . Tumor removal 2009    left knee  . Ectopic pregnancy surgery 2008  . Tumor removal 2010    left foot  . 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 Tumor removal 2011    left side of head  . Boils removed 2011    from bil groins  . Essure tubal ligation   .  Tubal ligation 09/05/2010    Procedure: ESSURE TUBAL STERILIZATION;  Surgeon: Roseanna Rainbow, MD;  Location: [facility];  Service: Gynecology;  Laterality: Left;  . Axillary hidradenitis excision 2012    both   Past Medical History  Diagnosis Date  . Headache     migraines  . Anxiety   . Depression   . Hidradenitis   . GERD (gastroesophageal reflux disease)     prilosec not reordered-  not taking x approx 3 weeks   BP 130/86  Pulse 77  Resp 14  Ht 5\' 7"  (1.702 m)  Wt 180 lb (81.647 kg)  BMI 28.19 kg/m2  SpO2 96%     Review of Systems  Neurological: Positive for weakness.  All other systems reviewed and are negative.       Objective:   Physical Exam        Assessment & Plan:

## 2011-06-04 NOTE — Progress Notes (Signed)
Left L5 S1-S2 dorsal ramus and left L4 medial branch radiofrequency neurotomy Sacroiliac pain which was relieved for 1 week on 2 occasions after sacroiliac injection Informed consent was obtained after describing risks and benefits of the procedure with the patient. These include bleeding bruising and infection. She elects to proceed and has given written consent. Patient placed in a prone position on the fluoroscopy table. Area marked and prepped with Betadine then a 25-gauge 1.5 inch needle was used to anesthetize the skin and subcutaneous tissue with 1 cc of 1% lidocaine at each spot. Then a 20-gauge 10 cm RF needle with a 10 mm curved active tip was inserted under fluoroscopic guidance first targeting the left S1 SAP sacral junction as well as the left L5 SAB transverse process junction. Bone contact made confirmed lateral imaging sensory stimulus at 50 Hz followed by motor stimulus 2 Hz confirmed proper needle location followed by injection of 1 cc of a solution containing 1 cc of 4 mg per cc dexamethasone and 4 cc of 1% MPF lidocaine. Radiofrequency lesioning 70C for 90 seconds performed. Then the lateral aspect of the left S1 S2-S3 foramen were targeted after anesthetizing skin and subcutaneous tissue with 1 cc 1% lidocaine in 3 spots. The 20-gauge 10 cm are needles with 10 mm curved active tip were inserted into each site. After sensory stem 50 Hz confirmed proper needle location 1 cc of dexamethasone lidocaine solution was injected. Then radiofrequency lesioning 70C for 70 seconds Post procedures structures given. No complications.

## 2011-06-17 ENCOUNTER — Telehealth (INDEPENDENT_AMBULATORY_CARE_PROVIDER_SITE_OTHER): Payer: Self-pay

## 2011-06-17 NOTE — Telephone Encounter (Signed)
Pt called c/o new abscess forming. Pt request to see Dr Corliss Skains. I advised her he is not in office today. Pt wants to wait to see him. I advised pt she needs to be seen sooner and if need be she can see Dr Corliss Skains later in week if the MD she sees cannot manage this. Pt declined and states she will see if improves over next 24hrs and if not will call for urg office appt with Dr Corliss Skains for Friday.

## 2011-07-01 ENCOUNTER — Ambulatory Visit (INDEPENDENT_AMBULATORY_CARE_PROVIDER_SITE_OTHER): Payer: Medicaid Other | Admitting: Surgery

## 2011-07-01 ENCOUNTER — Encounter (INDEPENDENT_AMBULATORY_CARE_PROVIDER_SITE_OTHER): Payer: Self-pay | Admitting: Surgery

## 2011-07-01 VITALS — BP 130/82 | HR 84 | Temp 97.8°F | Resp 18 | Ht 67.0 in | Wt 180.0 lb

## 2011-07-01 DIAGNOSIS — L732 Hidradenitis suppurativa: Secondary | ICD-10-CM

## 2011-07-01 NOTE — Progress Notes (Signed)
Subjective:     Patient ID: Claire Caldwell, female   DOB: 04-03-1985, 26 y.o.   MRN: [id number]  HPI This is a patient well-known to Dr. Harlon Flor with chronic hidradenitis. She had an area in her right groin flareup several days ago. Cipro was called in for her. She reports now that has spontaneously drained. She is still having moderate discomfort in the right groin  Review of Systems     Objective:   Physical Exam On exam, there is only one very small open area. There was no frank abscess or erythema. I believe all the purulence was drained    Assessment:     Chronic hidradenitis    Plan:     She will continue the Cipro. I prescribed Percocet. She will call his back if she does not improve

## 2011-07-03 ENCOUNTER — Encounter: Payer: Medicaid Other | Attending: Physical Medicine & Rehabilitation

## 2011-07-03 ENCOUNTER — Ambulatory Visit (HOSPITAL_BASED_OUTPATIENT_CLINIC_OR_DEPARTMENT_OTHER): Payer: Medicaid Other | Admitting: Physical Medicine & Rehabilitation

## 2011-07-03 ENCOUNTER — Encounter: Payer: Self-pay | Admitting: Physical Medicine & Rehabilitation

## 2011-07-03 VITALS — BP 136/80 | HR 73 | Resp 14 | Ht 67.0 in | Wt 180.0 lb

## 2011-07-03 DIAGNOSIS — M76899 Other specified enthesopathies of unspecified lower limb, excluding foot: Secondary | ICD-10-CM | POA: Insufficient documentation

## 2011-07-03 DIAGNOSIS — M533 Sacrococcygeal disorders, not elsewhere classified: Secondary | ICD-10-CM

## 2011-07-03 NOTE — Patient Instructions (Signed)
Continue with your abdominal and back muscle strengthening exercises Continue with her weight loss I will see you in about 4 months. If that feels like your RF is starting to wear off we can reschedule you for another one sooner than 6 months from the original RF

## 2011-07-03 NOTE — Progress Notes (Signed)
Subjective:    Patient ID: Claire Caldwell, female    DOB: 11-15-1985, 26 y.o.   MRN: [id number]  HPI Pain is no longer constant in L SI Pain Inventory Average Pain 7 Pain Right Now 6 My pain is tingling and aching  In the last 24 hours, has pain interfered with the following? General activity 5 Relation with others 5 Enjoyment of life 5 What TIME of day is your pain at its worst? evening Sleep (in general) Poor  Pain is worse with: walking, inactivity, standing and some activites Pain improves with: heat/ice, medication and injections Relief from Meds: 5  Mobility walk without assistance how many minutes can you walk? 20 ability to climb steps?  yes do you drive?  yes transfers alone  Function not employed: date last employed   Neuro/Psych tingling  Prior Studies Any changes since last visit?  no  Physicians involved in your care Any changes since last visit?  no   Family History  Problem Relation Age of Onset  . Cancer Maternal Aunt     breast  . Cancer Maternal Uncle     lung   History   Social History  . Marital Status: Married    Spouse Name: N/A    Number of Children: N/A  . Years of Education: N/A   Social History Main Topics  . Smoking status: Current Everyday Smoker -- 0.5 packs/day for 16 years    Types: Cigarettes  . Smokeless tobacco: None  . Alcohol Use: No  . Drug Use: No     5-80 years old  . Sexually Active: Not Currently   Other Topics Concern  . None   Social History Narrative  . None   Past Surgical History  Procedure Date  . Tonsillectomy     & adenoids as a child  . Ear cyst excision 2007    bil ears  . Tumor removal 2009    left knee  . Ectopic pregnancy surgery 2008  . Tumor removal 2010    left foot  . 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 Tumor removal 2011    left side of head  . Boils removed 2011    from bil groins  . Essure tubal ligation   . Tubal ligation 09/05/2010    Procedure: ESSURE  TUBAL STERILIZATION;  Surgeon: Roseanna Rainbow, MD;  Location: [facility];  Service: Gynecology;  Laterality: Left;  . Axillary hidradenitis excision 2012    both   Past Medical History  Diagnosis Date  . Headache     migraines  . Anxiety   . Depression   . Hidradenitis   . GERD (gastroesophageal reflux disease)     prilosec not reordered-  not taking x approx 3 weeks   BP 136/80  Pulse 73  Resp 14  Ht 5\' 7"  (1.702 m)  Wt 180 lb (81.647 kg)  BMI 28.19 kg/m2  SpO2 97%     Review of Systems  Musculoskeletal: Positive for back pain.  All other systems reviewed and are negative.       Objective:   Physical Exam  Constitutional: She is oriented to person, place, and time. She appears well-developed and well-nourished.  Musculoskeletal:       Lumbar back: She exhibits pain. She exhibits normal range of motion.       -SLR  Neurological: She is alert and oriented to person, place, and time. She has normal strength.  Psychiatric: She has  a normal mood and affect.          Assessment & Plan:  1.  Sacroiliac pain improved with RF Cont strenthening core muscles See me in 4 mo to assess need for repeat RF

## 2011-07-24 ENCOUNTER — Telehealth (INDEPENDENT_AMBULATORY_CARE_PROVIDER_SITE_OTHER): Payer: Self-pay | Admitting: General Surgery

## 2011-07-24 NOTE — Telephone Encounter (Signed)
Cipro 500 mg po bid #28   3 refills

## 2011-07-24 NOTE — Telephone Encounter (Signed)
Pt called to ask for appt with Dr. Corliss Skains, with flare of hidradenitis in her groin.  She is scheduled to leave town for the week-end at noon today, so she declined the offered appt for Urgent clinic.  Pt requested antibiotics for it if possible.  (Call to [facility]:  [phone].)  Pt can be reached on her cell phone for any questions:  [phone].  Please advise.

## 2011-07-27 ENCOUNTER — Telehealth: Payer: Self-pay

## 2011-07-27 NOTE — Telephone Encounter (Signed)
Pt is having a lot of trouble moving and is in a lot of pain.

## 2011-07-27 NOTE — Telephone Encounter (Signed)
Pt woke up this morning in severe pain and could barely get out of bed.  She says it starts in her back and radiates down her leg.  She did slip in a pool the other day.  Advised her to get some otc muscle rub and see how that works.  If it is not better in a few days call for appt.  Pt agreed.

## 2011-08-04 ENCOUNTER — Ambulatory Visit: Payer: Medicaid Other | Admitting: Physical Medicine & Rehabilitation

## 2011-08-12 ENCOUNTER — Telehealth (INDEPENDENT_AMBULATORY_CARE_PROVIDER_SITE_OTHER): Payer: Self-pay | Admitting: General Surgery

## 2011-08-12 NOTE — Telephone Encounter (Signed)
Let pt know that we called in her a Rx for cipro 500 mg PO BID and Vicodin 5/325 PO q4-6 PRN.

## 2011-08-12 NOTE — Telephone Encounter (Signed)
Pt called in stating that she is having another flare up of hidradenitis.  She declined an urgent office appt due to her son just having surgery and he is still in the hospital. She wanted to know if Dr. Corliss Skains would be willing to call in an antibiotic and pain medications for her.  She was last on cipro and percocet.

## 2011-08-12 NOTE — Telephone Encounter (Signed)
Cipro 500 mg PO BID #30.  2 refills  Vicodin one to two tabs PO q4-6 PRN #40 no refills

## 2011-08-27 ENCOUNTER — Encounter (INDEPENDENT_AMBULATORY_CARE_PROVIDER_SITE_OTHER): Payer: Self-pay | Admitting: Surgery

## 2011-08-27 ENCOUNTER — Ambulatory Visit (INDEPENDENT_AMBULATORY_CARE_PROVIDER_SITE_OTHER): Payer: Medicaid Other | Admitting: Surgery

## 2011-08-27 VITALS — BP 130/90 | HR 68 | Temp 97.2°F | Ht 67.0 in | Wt 175.0 lb

## 2011-08-27 DIAGNOSIS — L732 Hidradenitis suppurativa: Secondary | ICD-10-CM

## 2011-08-27 MED ORDER — OXYCODONE-ACETAMINOPHEN 10-650 MG PO TABS: 1.0000 | ORAL_TABLET | Freq: Four times a day (QID) | ORAL | Status: DC | PRN

## 2011-08-27 MED ORDER — CIPROFLOXACIN HCL 500 MG PO TABS: 500.0000 mg | ORAL_TABLET | Freq: Two times a day (BID) | ORAL | Status: AC

## 2011-08-27 NOTE — Progress Notes (Signed)
The patient has had a recurrence of some of the small superficial abscesses in her groin. She is unable to have any type of debridement today D2 scheduling difficulties. We will see her again tomorrow morning.  In the right groin anteriorly there is a small opening with a minimal amount of purulent drainage. There is minimal underlying abscess. On the right side of her labia there is also a small superficial abscess that is not draining. Posteriorly near the rectum on the left is a 1 cm abscess.  Persistent chronic hidradenitis of both groins. Recommend incision and drainage of the small superficial abscesses. I refilled her Percocet as well as her ciprofloxacin. We will see her again tomorrow morning to perform these I&D's.  Wilmon Arms. Corliss Skains, MD, [facility]  08/27/2011 5:18 PM

## 2011-08-28 ENCOUNTER — Ambulatory Visit (INDEPENDENT_AMBULATORY_CARE_PROVIDER_SITE_OTHER): Payer: Medicaid Other | Admitting: Surgery

## 2011-08-28 ENCOUNTER — Encounter (INDEPENDENT_AMBULATORY_CARE_PROVIDER_SITE_OTHER): Payer: Self-pay | Admitting: Surgery

## 2011-08-28 VITALS — BP 128/82 | HR 82 | Temp 97.4°F | Resp 18 | Ht 67.0 in | Wt 175.2 lb

## 2011-08-28 DIAGNOSIS — L732 Hidradenitis suppurativa: Secondary | ICD-10-CM

## 2011-08-28 NOTE — Progress Notes (Signed)
Patient ID: Claire Caldwell, female   DOB: January 15, 1985, 26 y.o.   MRN: [id number]  The patient comes in for incision and drainage of the buttock abscess. The small one on her labia has ruptured and no longer needs to be addressed. The one in her right groin looks much better than yesterday. We prepped the left buttock abscess with Betadine and anesthetized with 1% lidocaine. We made a 1 cm round incision and expressed a moderate amount of purulence. A dry dressing was applied. The patient will followup p.r.n.  Wilmon Arms. Corliss Skains, MD, [facility]  08/28/2011 8:48 AM

## 2011-09-13 ENCOUNTER — Telehealth: Payer: Self-pay | Admitting: Physical Medicine & Rehabilitation

## 2011-09-13 MED ORDER — METHYLPREDNISOLONE 4 MG PO KIT: PACK | ORAL | Status: AC

## 2011-09-13 MED ORDER — TRAMADOL HCL 50 MG PO TABS: 50.0000 mg | ORAL_TABLET | Freq: Four times a day (QID) | ORAL | Status: AC | PRN

## 2011-09-13 NOTE — Telephone Encounter (Signed)
Severe back pain with sciatica, numbness down right leg. E-scribed a medrol dose pack and tramadol #30. Asker her to call office first thing tomorrow

## 2011-09-15 ENCOUNTER — Encounter: Payer: Medicaid Other | Attending: Physical Medicine & Rehabilitation

## 2011-09-15 ENCOUNTER — Ambulatory Visit (HOSPITAL_BASED_OUTPATIENT_CLINIC_OR_DEPARTMENT_OTHER): Payer: Self-pay | Admitting: Physical Medicine & Rehabilitation

## 2011-09-15 ENCOUNTER — Encounter: Payer: Self-pay | Admitting: Physical Medicine & Rehabilitation

## 2011-09-15 VITALS — BP 157/94 | HR 91 | Resp 16 | Ht 67.0 in | Wt 172.0 lb

## 2011-09-15 DIAGNOSIS — M545 Low back pain: Secondary | ICD-10-CM

## 2011-09-15 DIAGNOSIS — M5432 Sciatica, left side: Secondary | ICD-10-CM

## 2011-09-15 DIAGNOSIS — M76899 Other specified enthesopathies of unspecified lower limb, excluding foot: Secondary | ICD-10-CM | POA: Insufficient documentation

## 2011-09-15 DIAGNOSIS — M5430 Sciatica, unspecified side: Secondary | ICD-10-CM

## 2011-09-15 MED ORDER — CYCLOBENZAPRINE HCL 5 MG PO TABS: 5.0000 mg | ORAL_TABLET | Freq: Three times a day (TID) | ORAL | Status: AC | PRN

## 2011-09-15 MED ORDER — KETOROLAC TROMETHAMINE 60 MG/2ML IM SOLN
60.0000 mg | Freq: Once | INTRAMUSCULAR | Status: AC
  Administered 2011-09-15: 60 mg via INTRAMUSCULAR

## 2011-09-15 NOTE — Patient Instructions (Signed)
You'll start the cyclobenzaprine which should help for muscle aches related to fibromyalgia           Fibromyalgia Fibromyalgia is a disorder that is often misunderstood. It is associated with muscular pains and tenderness that comes and goes. It is often associated with fatigue and sleep disturbances. Though it tends to be long-lasting, fibromyalgia is not life-threatening. CAUSES  The exact cause of fibromyalgia is unknown. People with certain gene types are predisposed to developing fibromyalgia and other conditions. Certain factors can play a role as triggers, such as:  Spine disorders.   Arthritis.   Severe injury (trauma) and other physical stressors.   Emotional stressors.  SYMPTOMS   The main symptom is pain and stiffness in the muscles and joints, which can vary over time.   Sleep and fatigue problems.  Other related symptoms may include:  Bowel and bladder problems.   Headaches.   Visual problems.   Problems with odors and noises.   Depression or mood changes.   Painful periods (dysmenorrhea).   Dryness of the skin or eyes.  DIAGNOSIS  There are no specific tests for diagnosing fibromyalgia. Patients can be diagnosed accurately from the specific symptoms they have. The diagnosis is made by determining that nothing else is causing the problems. TREATMENT  There is no cure. Management includes medicines and an active, healthy lifestyle. The goal is to enhance physical fitness, decrease pain, and improve sleep. HOME CARE INSTRUCTIONS   Only take over-the-counter or prescription medicines as directed by your caregiver. Sleeping pills, tranquilizers, and pain medicines may make your problems worse.   Low-impact aerobic exercise is very important and advised for treatment. At first, it may seem to make pain worse. Gradually increasing your tolerance will overcome this feeling.   Learning relaxation techniques and how to control stress will help you. Biofeedback, visual  imagery, hypnosis, muscle relaxation, yoga, and meditation are all options.   Anti-inflammatory medicines and physical therapy may provide short-term help.   Acupuncture or massage treatments may help.   Take muscle relaxant medicines as suggested by your caregiver.   Avoid stressful situations.   Plan a healthy lifestyle. This includes your diet, sleep, rest, exercise, and friends.   Find and practice a hobby you enjoy.   Join a fibromyalgia support group for interaction, ideas, and sharing advice. This may be helpful.  SEEK MEDICAL CARE IF:  You are not having good results or improvement from your treatment. FOR MORE INFORMATION  National Fibromyalgia Association: www.fmaware.org Arthritis Foundation: www.arthritis.org Document Released: 12/22/2004 Document Revised: 12/11/2010 Document Reviewed: 04/03/2009 [facility] Patient Information 2012 [place], [place].

## 2011-09-15 NOTE — Progress Notes (Signed)
Subjective:    Patient ID: Claire Caldwell, female    DOB: [birth year], 26 y.o.   MRN: [id number]  HPI She complains of worsening pain in the left hip down to the left thigh and leg for last 3 weeks. No clear-cut trauma right before this. She states she's felt down a couple times with some left leg weakness. History of left sacroiliac radiofrequency neurotomy in may 2013. Had good relief after the procedure. Pain during last visit June 28 was 5/10 today 7/10 No bowel or bladder incontinence although having trouble moving about.  In addition patient complains that she "hurts All over" Even when her young daughter touch as her it sometimes hurts. She states that fibromyalgia may run in her family Past medical history also significant for depression Pain Inventory Average Pain 8 Pain Right Now 8 My pain is sharp, burning, stabbing, tingling and aching  In the last 24 hours, has pain interfered with the following? General activity 7 Relation with others 7 Enjoyment of life 8 What TIME of day is your pain at its worst? evening Sleep (in general) Poor  Pain is worse with: walking, bending, sitting, inactivity, standing and some activites Pain improves with: Nothing Relief from Meds: 0  Mobility Walks without assistance  Function not employed: date last employed   Neuro/Psych weakness numbness tingling trouble walking  Prior Studies Any changes since last visit?  no  Physicians involved in your care Any changes since last visit?  no   Family History  Problem Relation Age of Onset  . Cancer Maternal Aunt     breast  . Cancer Maternal Uncle     lung   History   Social History  . Marital Status: Married    Spouse Name: N/A    Number of Children: N/A  . Years of Education: N/A   Social History Main Topics  . Smoking status: Current Everyday Smoker -- 0.5 packs/day for 16 years    Types: Cigarettes  . Smokeless tobacco: None  . Alcohol Use: No  . Drug Use: No     61-34 years old  . Sexually Active: Not Currently   Other Topics Concern  . None   Social History Narrative  . None   Past Surgical History  Procedure Date  . Tonsillectomy     & adenoids as a child  . Ear cyst excision 2007    bil ears  . Tumor removal 2009    left knee  . Ectopic pregnancy surgery 2008  . Tumor removal 2010    left foot  . 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 Tumor removal 2011    left side of head  . Boils removed 2011    from bil groins  . Essure tubal ligation   . Tubal ligation 09/05/2010    Procedure: ESSURE TUBAL STERILIZATION;  Surgeon: Roseanna Rainbow, MD;  Location: [facility];  Service: Gynecology;  Laterality: Left;  . Axillary hidradenitis excision 2012    both   Past Medical History  Diagnosis Date  . Headache     migraines  . Anxiety   . Depression   . Hidradenitis   . GERD (gastroesophageal reflux disease)     prilosec not reordered-  not taking x approx 3 weeks   BP 157/94  Pulse 91  Resp 16  Ht 5\' 7"  (1.702 m)  Wt 172 lb (78.019 kg)  BMI 26.94 kg/m2  SpO2 97%  Review of Systems  HENT: Negative.   Eyes: Negative.   Respiratory: Negative.   Cardiovascular: Negative.   Gastrointestinal: Negative.   Genitourinary: Negative.   Musculoskeletal: Positive for gait problem.  Skin: Negative.   Neurological: Positive for weakness and numbness.  Hematological: Negative.   Psychiatric/Behavioral: Negative.        Objective:   Physical Exam  Constitutional: She is oriented to person, place, and time. She appears well-developed and well-nourished.  Musculoskeletal:       Lumbar back: She exhibits decreased range of motion and tenderness.       Tender to palpation in trapezius Low back, hips, sternal costal In the fibromyalgia tender points. Straight leg raising test is positive on the left side   Neurological: She is alert and oriented to person, place, and time. She displays no atrophy. She exhibits  normal muscle tone.  Reflex Scores:      Patellar reflexes are 2+ on the right side and 2+ on the left side.      Achilles reflexes are 2+ on the right side and 2+ on the left side.      Dictation reduced in the left L5 dermatome distribution. Give way weakness in the left quad and left TA  Psychiatric: She has a normal mood and affect.          Assessment & Plan:  1. Left buttock pain. She has documented sacroiliac disorder and had a radiofrequency neurotomy which was helpful at least for 3 months. She does have some pain radiating further down the left lower extremity so this may represent a new issues such as a radiculopathy. She has no clear-cut neurologic deficits. She does have decreased sensation in the left L5 dermatome distribution. 2. Pain all over may be fibromyalgia type but would need further workup to definitively diagnose Given that her primary concern is her low back and left lower extremity pain today will hold off on this.  Will check MRI of the lumbosacral spine Start cyclobenzaprine 5 mg 3 times per day Continue Medrol Dosepak which was called in yesterday I will see her back in 2 weeks

## 2011-09-17 ENCOUNTER — Telehealth: Payer: Self-pay

## 2011-09-17 NOTE — Telephone Encounter (Signed)
Patient called to say medication is not helping and wanted to know if there is something else she can try.  Please advise.

## 2011-09-18 ENCOUNTER — Telehealth (INDEPENDENT_AMBULATORY_CARE_PROVIDER_SITE_OTHER): Payer: Self-pay | Admitting: General Surgery

## 2011-09-18 MED ORDER — DULOXETINE HCL 30 MG PO CPEP: 30.0000 mg | ORAL_CAPSULE | Freq: Every day | ORAL | Status: DC

## 2011-09-18 NOTE — Telephone Encounter (Signed)
Cymbalta ordered Instruct pt that it may take a week or two to take full effect

## 2011-09-18 NOTE — Telephone Encounter (Signed)
Patient informed of new medication.

## 2011-09-18 NOTE — Telephone Encounter (Signed)
Pt called to ask for antibx for hidradenitis flare:  Rt x2 and Lt x1.  She is trying to get out of town to dying grandparent.  Explained she has 3 refills on Cipro prescribed 08/27/11 by Dr. Corliss Skains.  She will call pharmacy.

## 2011-09-24 ENCOUNTER — Telehealth: Payer: Self-pay | Admitting: Physical Medicine & Rehabilitation

## 2011-09-24 NOTE — Telephone Encounter (Signed)
Still having a lot of pain.  No way to get to appointment.  Please call.

## 2011-09-24 NOTE — Telephone Encounter (Signed)
Spoke with patient regarding pain.  She has been taking cymbalta.  This has helped.  Advised her to give the medication a little more time.  She agreed and will call in a couple of weeks if it does not get better.

## 2011-09-25 ENCOUNTER — Ambulatory Visit (HOSPITAL_COMMUNITY)
Admission: RE | Admit: 2011-09-25 | Discharge: 2011-09-25 | Disposition: A | Discharge status: Home / Self Care | Payer: Medicaid Other | Source: Ambulatory Visit | Attending: Physical Medicine & Rehabilitation | Admitting: Physical Medicine & Rehabilitation

## 2011-09-25 DIAGNOSIS — M79609 Pain in unspecified limb: Secondary | ICD-10-CM | POA: Insufficient documentation

## 2011-09-25 DIAGNOSIS — M545 Low back pain, unspecified: Secondary | ICD-10-CM | POA: Insufficient documentation

## 2011-09-25 DIAGNOSIS — R29898 Other symptoms and signs involving the musculoskeletal system: Secondary | ICD-10-CM | POA: Insufficient documentation

## 2011-09-25 DIAGNOSIS — M5432 Sciatica, left side: Secondary | ICD-10-CM

## 2011-09-25 DIAGNOSIS — M5380 Other specified dorsopathies, site unspecified: Secondary | ICD-10-CM | POA: Insufficient documentation

## 2011-09-28 ENCOUNTER — Encounter: Payer: Self-pay | Admitting: Physical Medicine & Rehabilitation

## 2011-09-28 ENCOUNTER — Ambulatory Visit (HOSPITAL_BASED_OUTPATIENT_CLINIC_OR_DEPARTMENT_OTHER): Payer: Medicaid Other | Admitting: Physical Medicine & Rehabilitation

## 2011-09-28 VITALS — BP 131/81 | HR 81 | Resp 14 | Ht 67.0 in | Wt 172.0 lb

## 2011-09-28 DIAGNOSIS — M47817 Spondylosis without myelopathy or radiculopathy, lumbosacral region: Secondary | ICD-10-CM

## 2011-09-28 DIAGNOSIS — M47816 Spondylosis without myelopathy or radiculopathy, lumbar region: Secondary | ICD-10-CM

## 2011-09-28 MED ORDER — DICLOFENAC SODIUM 1 % TD GEL: 4.0000 g | Freq: Four times a day (QID) | TRANSDERMAL | Status: DC

## 2011-09-28 NOTE — Patient Instructions (Signed)

## 2011-09-28 NOTE — Progress Notes (Signed)
  Subjective:    Patient ID: Claire Caldwell, female    DOB: 09-Apr-1985, 26 y.o.   MRN: [id number]  HPI  Pain Inventory Average Pain 8 Pain Right Now 7 My pain is constant, stabbing, tingling and aching  In the last 24 hours, has pain interfered with the following? General activity 7 Relation with others 7 Enjoyment of life 7 What TIME of day is your pain at its worst? evening Sleep (in general) Poor  Pain is worse with: walking, bending, sitting, inactivity and standing Pain improves with: nothing Relief from Meds: 0  Mobility walk without assistance how many minutes can you walk? 10 ability to climb steps?  yes do you drive?  yes  Function not employed: date last employed   Neuro/Psych numbness tingling trouble walking spasms  Prior Studies Any changes since last visit?  no  Physicians involved in your care Any changes since last visit?  no   Family History  Problem Relation Age of Onset  . Cancer Maternal Aunt     breast  . Cancer Maternal Uncle     lung   History   Social History  . Marital Status: Married    Spouse Name: N/A    Number of Children: N/A  . Years of Education: N/A   Social History Main Topics  . Smoking status: Current Every Day Smoker -- 0.5 packs/day for 16 years    Types: Cigarettes  . Smokeless tobacco: Never Used  . Alcohol Use: No  . Drug Use: No     67-18 years old  . Sexually Active: Not Currently   Other Topics Concern  . None   Social History Narrative  . None   Past Surgical History  Procedure Date  . Tonsillectomy     & adenoids as a child  . Ear cyst excision 2007    bil ears  . Tumor removal 2009    left knee  . Ectopic pregnancy surgery 2008  . Tumor removal 2010    left foot  . 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 Tumor removal 2011    left side of head  . Boils removed 2011    from bil groins  . Essure tubal ligation   . Tubal ligation 09/05/2010    Procedure: ESSURE TUBAL  STERILIZATION;  Surgeon: Roseanna Rainbow, MD;  Location: [facility];  Service: Gynecology;  Laterality: Left;  . Axillary hidradenitis excision 2012    both   Past Medical History  Diagnosis Date  . Headache     migraines  . Anxiety   . Depression   . Hidradenitis   . GERD (gastroesophageal reflux disease)     prilosec not reordered-  not taking x approx 3 weeks   BP 131/81  Pulse 81  Resp 14  Ht 5\' 7"  (1.702 m)  Wt 172 lb (78.019 kg)  BMI 26.94 kg/m2  SpO2 100%    Review of Systems  Musculoskeletal: Positive for back pain and gait problem.  All other systems reviewed and are negative.       Objective:   Physical Exam        Assessment & Plan:

## 2011-09-28 NOTE — Progress Notes (Signed)
  Subjective:    Patient ID: Claire Caldwell, female    DOB: [birth year], 26 y.o.   MRN: [id number]  HPI She complains of worsening pain in the left hip down to the left thigh and leg for last 3 weeks. No clear-cut trauma right before this. She states she's felt down a couple times with some left leg weakness. History of left sacroiliac radiofrequency neurotomy in may 2013. Had good relief after the procedure. Pain during last visit June 28 was 5/10 today 7/10 No bowel or bladder incontinence although having trouble moving about.  Last visit was on 09/15/2011. At that time an MRI was ordered. Results were reviewed with the patient I looked over the films himself and showed the patient. She has no signs of nerve root impingement. She has essentially no disc problems. The L4-L5 bulge noted by the radiologist is miniscule There is mild facet joint arthropathy L4-L5 and L5-S1   Review of Systems  Musculoskeletal: Positive for myalgias and back pain.  All other systems reviewed and are negative.       Objective:   Physical Exam  Constitutional: She is oriented to person, place, and time. She appears well-developed.       obese  HENT:  Head: Normocephalic and atraumatic.  Eyes: Conjunctivae normal and EOM are normal. Pupils are equal, round, and reactive to light.  Musculoskeletal: She exhibits tenderness.       Lumbar back: She exhibits decreased range of motion. She exhibits no deformity and no spasm.       Bilateral hip greater trochanter tenderness  Negative straight leg raising test  Neurological: She is alert and oriented to person, place, and time.  Psychiatric: She has a normal mood and affect.          Assessment & Plan:  1. Back pain chronic, has responded to sacroiliac injections. Had an exacerbation after a fall. MRI showing no significant discogenic problems. She may have some element of lumbar spondylosis. Main risk factor would be her weight. 2. Hip pain with greater  trochanter tenderness. Will trial her with diclofenac gel and inject in 3-4 weeks if no better I gave her hip exercises as well Do not think narcotic analgesics are indicated in this situation. Patient feels reassured after we went over her MRI results. Surgery is not in her future

## 2011-09-29 ENCOUNTER — Telehealth: Payer: Self-pay | Admitting: Physical Medicine & Rehabilitation

## 2011-09-29 NOTE — Telephone Encounter (Signed)
Used the gel and 2 hours later it was burning like icy hot.  Is this normal?  Please call.

## 2011-09-30 NOTE — Telephone Encounter (Signed)
That is not normal, did she put too much on, did she go out in the sun ? If not she might have an allergic reaction and then should not use it

## 2011-09-30 NOTE — Telephone Encounter (Signed)
Pt was given Voltaren Gel at her last appointment. Please advise.

## 2011-10-02 NOTE — Telephone Encounter (Signed)
Pt states that she has used it since she called and it is not burning anymore. I advised her that if it happens again discontinue use.

## 2011-10-02 NOTE — Telephone Encounter (Signed)
Great, thanks

## 2011-10-06 DIAGNOSIS — L732 Hidradenitis suppurativa: Secondary | ICD-10-CM

## 2011-10-06 HISTORY — DX: Hidradenitis suppurativa: L73.2

## 2011-10-22 ENCOUNTER — Encounter (INDEPENDENT_AMBULATORY_CARE_PROVIDER_SITE_OTHER): Payer: Self-pay | Admitting: Surgery

## 2011-10-22 ENCOUNTER — Ambulatory Visit (INDEPENDENT_AMBULATORY_CARE_PROVIDER_SITE_OTHER): Payer: Medicaid Other | Admitting: Surgery

## 2011-10-22 VITALS — BP 136/64 | HR 100 | Temp 97.8°F | Resp 16 | Ht 67.0 in | Wt 174.0 lb

## 2011-10-22 DIAGNOSIS — L732 Hidradenitis suppurativa: Secondary | ICD-10-CM

## 2011-10-22 MED ORDER — OXYCODONE-ACETAMINOPHEN 5-325 MG PO TABS: 1.0000 | ORAL_TABLET | ORAL | Status: DC | PRN

## 2011-10-22 NOTE — Progress Notes (Signed)
Patient ID: Claire Caldwell, female   DOB: 05/04/1985, 26 y.o.   MRN: [id number]  Chief Complaint  Patient presents with  . Follow-up    reck groin hidradenitis    HPI Claire Caldwell is a 26 y.o. female.  HPI This is a 26-year-old female who presents with several years of recurrent hidradenitis in both groins. She has had several previous surgeries to excise some of these areas. She presents with a recurrent area on her right side. This has flared up several times in the last couple of months. She also has developed a palpable cyst near this area. The cyst currently is not inflamed but does sometimes drain. She presents now to discuss possible surgical excision.  Past Medical History  Diagnosis Date  . Headache     migraines  . Anxiety   . Depression   . Hidradenitis   . GERD (gastroesophageal reflux disease)     prilosec not reordered-  not taking x approx 3 weeks    Past Surgical History  Procedure Date  . Tonsillectomy     & adenoids as a child  . Ear cyst excision 2007    bil ears  . Tumor removal 2009    left knee  . Ectopic pregnancy surgery 2008  . Tumor removal 2010    left foot  . 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 Tumor removal 2011    left side of head  . Boils removed 2011    from bil groins  . Essure tubal ligation   . Tubal ligation 09/05/2010    Procedure: ESSURE TUBAL STERILIZATION;  Surgeon: [name] [name], MD;  Location: [facility];  Service: Gynecology;  Laterality: Left;  . Axillary hidradenitis excision 2012    both    Family History  Problem Relation Age of Onset  . Cancer Maternal Aunt     breast  . Cancer Maternal Uncle     lung    Social History History  Substance Use Topics  . Smoking status: Current Every Day Smoker -- 0.5 packs/day for 16 years    Types: Cigarettes  . Smokeless tobacco: Never Used  . Alcohol Use: No    Allergies  Allergen Reactions  . Meloxicam   . Tramadol     Headache  . Ibuprofen Itching  and Rash    Current Outpatient Prescriptions  Medication Sig Dispense Refill  . cyclobenzaprine (FLEXERIL) 5 MG tablet Take 5 mg by mouth 3 (three) times daily as needed.      . diclofenac sodium (VOLTAREN) 1 % GEL Apply 4 g topically 4 (four) times daily.  5 Tube  1  . DULoxetine (CYMBALTA) 30 MG capsule Take 1 capsule (30 mg total) by mouth daily.  30 capsule  11  . omeprazole (PRILOSEC) 10 MG capsule Take 10 mg by mouth daily.      . Prenatal Vit-Fe Sulfate-FA (PRENATAL VITAMIN PO) Take by mouth daily.        . oxyCODONE-acetaminophen (PERCOCET/ROXICET) 5-325 MG per tablet Take 1 tablet by mouth every 4 (four) hours as needed for pain.  40 tablet  0    Review of Systems Review of Systems  Constitutional: Negative for fever, chills and unexpected weight change.  HENT: Negative for hearing loss, congestion, sore throat, trouble swallowing and voice change.   Eyes: Negative for visual disturbance.  Respiratory: Negative for cough and wheezing.   Cardiovascular: Negative for chest pain, palpitations and leg swelling.  Gastrointestinal:   Negative for nausea, vomiting, abdominal pain, diarrhea, constipation, blood in stool, abdominal distention and anal bleeding.  Genitourinary: Negative for hematuria, vaginal bleeding and difficulty urinating.  Musculoskeletal: Positive for arthralgias.  Skin: Negative for rash and wound.  Neurological: Negative for seizures, syncope and headaches.  Hematological: Negative for adenopathy. Does not bruise/bleed easily.  Psychiatric/Behavioral: Negative for confusion.    Blood pressure 136/64, pulse 100, temperature 97.8 F (36.6 C), temperature source Temporal, resp. rate 16, height 5' 7" (1.702 m), weight 174 lb (78.926 kg).  Physical Exam Physical Exam WDWN in NAD HEENT:  EOMI, sclera anicteric Neck:  No masses, no thyromegaly Lungs:  CTA bilaterally; normal respiratory effort CV:  Regular rate and rhythm; no murmurs Abd:  +bowel sounds, soft,  non-tender, no masses Ext:  Well-perfused; no edema Skin:  Warm, dry; no sign of jaundice Right groin - 1 cm palpable subcutaneous nodule; just anterior to this is a 2 cm area of erythema and minimal induration with some drainage  Data Reviewed none  Assessment    Recurrent hidradenitis right groin     Plan    Excision of hidradenitis right groin.  The surgical procedure has been discussed with the patient.  Potential risks, benefits, alternative treatments, and expected outcomes have been explained.  All of the patient's questions at this time have been answered.  The likelihood of reaching the patient's treatment goal is good.  The patient understand the proposed surgical procedure and wishes to proceed.        Naava Janeway K. 10/22/2011, 11:38 AM    

## 2011-10-26 ENCOUNTER — Encounter: Payer: Medicaid Other | Attending: Physical Medicine & Rehabilitation

## 2011-10-26 ENCOUNTER — Encounter (HOSPITAL_BASED_OUTPATIENT_CLINIC_OR_DEPARTMENT_OTHER): Payer: Self-pay | Admitting: *Deleted

## 2011-10-26 ENCOUNTER — Ambulatory Visit: Payer: Medicaid Other | Admitting: Physical Medicine & Rehabilitation

## 2011-10-26 DIAGNOSIS — M76899 Other specified enthesopathies of unspecified lower limb, excluding foot: Secondary | ICD-10-CM | POA: Insufficient documentation

## 2011-10-27 ENCOUNTER — Encounter (HOSPITAL_BASED_OUTPATIENT_CLINIC_OR_DEPARTMENT_OTHER): Payer: Self-pay | Admitting: *Deleted

## 2011-10-28 ENCOUNTER — Encounter: Payer: Self-pay | Admitting: *Deleted

## 2011-10-30 ENCOUNTER — Encounter (HOSPITAL_BASED_OUTPATIENT_CLINIC_OR_DEPARTMENT_OTHER): Payer: Self-pay | Admitting: Anesthesiology

## 2011-10-30 ENCOUNTER — Ambulatory Visit (HOSPITAL_BASED_OUTPATIENT_CLINIC_OR_DEPARTMENT_OTHER): Payer: Medicaid Other | Admitting: Anesthesiology

## 2011-10-30 ENCOUNTER — Encounter (HOSPITAL_BASED_OUTPATIENT_CLINIC_OR_DEPARTMENT_OTHER)
Admission: RE | Disposition: A | Discharge status: Home / Self Care | Payer: Self-pay | Source: Ambulatory Visit | Attending: Surgery

## 2011-10-30 ENCOUNTER — Ambulatory Visit (HOSPITAL_BASED_OUTPATIENT_CLINIC_OR_DEPARTMENT_OTHER)
Admission: RE | Admit: 2011-10-30 | Discharge: 2011-10-30 | Disposition: A | Discharge status: Home / Self Care | Payer: Medicaid Other | Source: Ambulatory Visit | Attending: Surgery | Admitting: Surgery

## 2011-10-30 ENCOUNTER — Encounter (HOSPITAL_BASED_OUTPATIENT_CLINIC_OR_DEPARTMENT_OTHER): Payer: Self-pay | Admitting: *Deleted

## 2011-10-30 DIAGNOSIS — L732 Hidradenitis suppurativa: Secondary | ICD-10-CM

## 2011-10-30 HISTORY — DX: Fibromyalgia: M79.7

## 2011-10-30 HISTORY — DX: Unspecified osteoarthritis, unspecified site: M19.90

## 2011-10-30 HISTORY — PX: HYDRADENITIS EXCISION: SHX5243

## 2011-10-30 HISTORY — DX: Hematuria, unspecified: R31.9

## 2011-10-30 HISTORY — DX: Cyst of kidney, acquired: N28.1

## 2011-10-30 LAB — POCT HEMOGLOBIN-HEMACUE: Hemoglobin: 12.8 g/dL (ref 12.0–15.0)

## 2011-10-30 SURGERY — EXCISION, HIDRADENITIS, INGUINAL REGION
Anesthesia: General | Site: Groin | Laterality: Bilateral | Wound class: Contaminated

## 2011-10-30 MED ORDER — CIPROFLOXACIN IN D5W 400 MG/200ML IV SOLN: 400.0000 mg | INTRAVENOUS | Status: DC

## 2011-10-30 MED ORDER — MIDAZOLAM HCL 5 MG/5ML IJ SOLN
INTRAMUSCULAR | Status: DC | PRN
  Administered 2011-10-30: 2 mg via INTRAVENOUS

## 2011-10-30 MED ORDER — PROPOFOL 10 MG/ML IV BOLUS
INTRAVENOUS | Status: DC | PRN
  Administered 2011-10-30: 200 mg via INTRAVENOUS
  Administered 2011-10-30: 20 mg via INTRAVENOUS

## 2011-10-30 MED ORDER — OXYCODONE HCL 5 MG PO TABS
5.0000 mg | ORAL_TABLET | Freq: Once | ORAL | Status: AC | PRN
  Administered 2011-10-30: 5 mg via ORAL

## 2011-10-30 MED ORDER — HYDROMORPHONE HCL PF 1 MG/ML IJ SOLN
0.2500 mg | INTRAMUSCULAR | Status: DC | PRN
  Administered 2011-10-30 (×4): 0.5 mg via INTRAVENOUS

## 2011-10-30 MED ORDER — ONDANSETRON HCL 4 MG/2ML IJ SOLN
INTRAMUSCULAR | Status: DC | PRN
  Administered 2011-10-30: 4 mg via INTRAVENOUS

## 2011-10-30 MED ORDER — BUPIVACAINE-EPINEPHRINE 0.25% -1:200000 IJ SOLN
INTRAMUSCULAR | Status: DC | PRN
  Administered 2011-10-30: 10 mL

## 2011-10-30 MED ORDER — LACTATED RINGERS IV SOLN
INTRAVENOUS | Status: DC
  Administered 2011-10-30: 07:00:00 via INTRAVENOUS
  Administered 2011-10-30: 20 mL/h via INTRAVENOUS

## 2011-10-30 MED ORDER — DEXAMETHASONE SODIUM PHOSPHATE 4 MG/ML IJ SOLN
INTRAMUSCULAR | Status: DC | PRN
  Administered 2011-10-30: 10 mg via INTRAVENOUS

## 2011-10-30 MED ORDER — OXYCODONE-ACETAMINOPHEN 5-325 MG PO TABS: 1.0000 | ORAL_TABLET | ORAL | Status: DC | PRN

## 2011-10-30 MED ORDER — ONDANSETRON HCL 4 MG/2ML IJ SOLN: 4.0000 mg | Freq: Once | INTRAMUSCULAR | Status: DC | PRN

## 2011-10-30 MED ORDER — LIDOCAINE HCL (CARDIAC) 20 MG/ML IV SOLN
INTRAVENOUS | Status: DC | PRN
  Administered 2011-10-30: 50 mg via INTRAVENOUS

## 2011-10-30 MED ORDER — CIPROFLOXACIN IN D5W 400 MG/200ML IV SOLN
400.0000 mg | INTRAVENOUS | Status: AC
  Administered 2011-10-30: 400 mg via INTRAVENOUS

## 2011-10-30 MED ORDER — OXYCODONE HCL 5 MG/5ML PO SOLN: 5.0000 mg | Freq: Once | ORAL | Status: AC | PRN

## 2011-10-30 MED ORDER — FENTANYL CITRATE 0.05 MG/ML IJ SOLN
INTRAMUSCULAR | Status: DC | PRN
  Administered 2011-10-30: 100 ug via INTRAVENOUS

## 2011-10-30 SURGICAL SUPPLY — 49 items
ADH SKN CLS APL DERMABOND .7 (GAUZE/BANDAGES/DRESSINGS) ×1
APL SKNCLS STERI-STRIP NONHPOA (GAUZE/BANDAGES/DRESSINGS)
BENZOIN TINCTURE PRP APPL 2/3 (GAUZE/BANDAGES/DRESSINGS) IMPLANT
BLADE HEX COATED 2.75 (ELECTRODE) ×2 IMPLANT
BLADE SURG 15 STRL LF DISP TIS (BLADE) ×1 IMPLANT
BLADE SURG 15 STRL SS (BLADE) ×2
CANISTER SUCTION 1200CC (MISCELLANEOUS) ×2 IMPLANT
CHLORAPREP W/TINT 26ML (MISCELLANEOUS) ×1 IMPLANT
CLOTH BEACON ORANGE TIMEOUT ST (SAFETY) ×2 IMPLANT
COVER MAYO STAND STRL (DRAPES) ×2 IMPLANT
COVER TABLE BACK 60X90 (DRAPES) ×1 IMPLANT
DECANTER SPIKE VIAL GLASS SM (MISCELLANEOUS) IMPLANT
DERMABOND ADVANCED (GAUZE/BANDAGES/DRESSINGS) ×1
DERMABOND ADVANCED .7 DNX12 (GAUZE/BANDAGES/DRESSINGS) IMPLANT
DRAPE UTILITY XL STRL (DRAPES) ×2 IMPLANT
DRSG TEGADERM 4X4.75 (GAUZE/BANDAGES/DRESSINGS) IMPLANT
ELECT REM PT RETURN 9FT ADLT (ELECTROSURGICAL) ×2
ELECTRODE REM PT RTRN 9FT ADLT (ELECTROSURGICAL) ×1 IMPLANT
GAUZE SPONGE 4X4 12PLY STRL LF (GAUZE/BANDAGES/DRESSINGS) ×2 IMPLANT
GAUZE XEROFORM 1X8 LF (GAUZE/BANDAGES/DRESSINGS) IMPLANT
GLOVE BIO SURGEON STRL SZ 6.5 (GLOVE) ×1 IMPLANT
GLOVE BIO SURGEON STRL SZ7 (GLOVE) ×2 IMPLANT
GLOVE BIO SURGEON STRL SZ7.5 (GLOVE) ×1 IMPLANT
GLOVE BIOGEL PI IND STRL 7.5 (GLOVE) ×1 IMPLANT
GLOVE BIOGEL PI IND STRL 8 (GLOVE) IMPLANT
GLOVE BIOGEL PI INDICATOR 7.5 (GLOVE) ×1
GLOVE BIOGEL PI INDICATOR 8 (GLOVE) ×1
GOWN PREVENTION PLUS XLARGE (GOWN DISPOSABLE) ×4 IMPLANT
NDL HYPO 25X1 1.5 SAFETY (NEEDLE) ×1 IMPLANT
NEEDLE HYPO 25X1 1.5 SAFETY (NEEDLE) ×2 IMPLANT
NS IRRIG 1000ML POUR BTL (IV SOLUTION) IMPLANT
PACK BASIN DAY SURGERY FS (CUSTOM PROCEDURE TRAY) ×2 IMPLANT
PACK LITHOTOMY IV (CUSTOM PROCEDURE TRAY) ×2 IMPLANT
PENCIL BUTTON HOLSTER BLD 10FT (ELECTRODE) ×2 IMPLANT
SLEEVE SCD COMPRESS KNEE MED (MISCELLANEOUS) ×1 IMPLANT
SPONGE LAP 4X18 X RAY DECT (DISPOSABLE) ×2 IMPLANT
STRIP CLOSURE SKIN 1/2X4 (GAUZE/BANDAGES/DRESSINGS) IMPLANT
SUT ETHILON 3 0 PS 1 (SUTURE) IMPLANT
SUT MON AB 4-0 PC3 18 (SUTURE) ×1 IMPLANT
SUT VIC AB 3-0 SH 27 (SUTURE)
SUT VIC AB 3-0 SH 27X BRD (SUTURE) ×1 IMPLANT
SYR BULB 3OZ (MISCELLANEOUS) IMPLANT
SYR CONTROL 10ML LL (SYRINGE) ×2 IMPLANT
TOWEL OR 17X24 6PK STRL BLUE (TOWEL DISPOSABLE) ×2 IMPLANT
TOWEL OR NON WOVEN STRL DISP B (DISPOSABLE) ×2 IMPLANT
TRAY DSU PREP LF (CUSTOM PROCEDURE TRAY) ×1 IMPLANT
TUBE CONNECTING 20X1/4 (TUBING) ×2 IMPLANT
WATER STERILE IRR 1000ML POUR (IV SOLUTION) IMPLANT
YANKAUER SUCT BULB TIP NO VENT (SUCTIONS) ×2 IMPLANT

## 2011-10-30 NOTE — Transfer of Care (Signed)
Immediate Anesthesia Transfer of Care Note  Patient: Claire Caldwell  Procedure(s) Performed: Procedure(s) (LRB) with comments: EXCISION HYDRADENITIS GROIN (Bilateral)  Patient Location: PACU  Anesthesia Type: General  Level of Consciousness: sedated and patient cooperative  Airway & Oxygen Therapy: Patient Spontanous Breathing and Patient connected to face mask oxygen  Post-op Assessment: Report given to PACU RN and Post -op Vital signs reviewed and stable  Post vital signs: Reviewed and stable  Complications: No apparent anesthesia complications

## 2011-10-30 NOTE — H&P (View-Only) (Signed)
Patient ID: Claire Caldwell, female   DOB: [birth year], 26 y.o.   MRN: [id number]  Chief Complaint  Patient presents with  . Follow-up    reck groin hidradenitis    HPI Claire Caldwell is a 26 y.o. female.  HPI This is a 26 year old female who presents with several years of recurrent hidradenitis in both groins. She has had several previous surgeries to excise some of these areas. She presents with a recurrent area on her right side. This has flared up several times in the last couple of months. She also has developed a palpable cyst near this area. The cyst currently is not inflamed but does sometimes drain. She presents now to discuss possible surgical excision.  Past Medical History  Diagnosis Date  . Headache     migraines  . Anxiety   . Depression   . Hidradenitis   . GERD (gastroesophageal reflux disease)     prilosec not reordered-  not taking x approx 3 weeks    Past Surgical History  Procedure Date  . Tonsillectomy     & adenoids as a child  . Ear cyst excision 2007    bil ears  . Tumor removal 2009    left knee  . Ectopic pregnancy surgery 2008  . Tumor removal 2010    left foot  . 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 Tumor removal 2011    left side of head  . Boils removed 2011    from bil groins  . Essure tubal ligation   . Tubal ligation 09/05/2010    Procedure: ESSURE TUBAL STERILIZATION;  Surgeon: Roseanna Rainbow, MD;  Location: [facility];  Service: Gynecology;  Laterality: Left;  . Axillary hidradenitis excision 2012    both    Family History  Problem Relation Age of Onset  . Cancer Maternal Aunt     breast  . Cancer Maternal Uncle     lung    Social History History  Substance Use Topics  . Smoking status: Current Every Day Smoker -- 0.5 packs/day for 16 years    Types: Cigarettes  . Smokeless tobacco: Never Used  . Alcohol Use: No    Allergies  Allergen Reactions  . Meloxicam   . Tramadol     Headache  . Ibuprofen Itching  and Rash    Current Outpatient Prescriptions  Medication Sig Dispense Refill  . cyclobenzaprine (FLEXERIL) 5 MG tablet Take 5 mg by mouth 3 (three) times daily as needed.      . diclofenac sodium (VOLTAREN) 1 % GEL Apply 4 g topically 4 (four) times daily.  5 Tube  1  . DULoxetine (CYMBALTA) 30 MG capsule Take 1 capsule (30 mg total) by mouth daily.  30 capsule  11  . omeprazole (PRILOSEC) 10 MG capsule Take 10 mg by mouth daily.      . Prenatal Vit-Fe Sulfate-FA (PRENATAL VITAMIN PO) Take by mouth daily.        Marland Kitchen oxyCODONE-acetaminophen (PERCOCET/ROXICET) 5-325 MG per tablet Take 1 tablet by mouth every 4 (four) hours as needed for pain.  40 tablet  0    Review of Systems Review of Systems  Constitutional: Negative for fever, chills and unexpected weight change.  HENT: Negative for hearing loss, congestion, sore throat, trouble swallowing and voice change.   Eyes: Negative for visual disturbance.  Respiratory: Negative for cough and wheezing.   Cardiovascular: Negative for chest pain, palpitations and leg swelling.  Gastrointestinal:  Negative for nausea, vomiting, abdominal pain, diarrhea, constipation, blood in stool, abdominal distention and anal bleeding.  Genitourinary: Negative for hematuria, vaginal bleeding and difficulty urinating.  Musculoskeletal: Positive for arthralgias.  Skin: Negative for rash and wound.  Neurological: Negative for seizures, syncope and headaches.  Hematological: Negative for adenopathy. Does not bruise/bleed easily.  Psychiatric/Behavioral: Negative for confusion.    Blood pressure 136/64, pulse 100, temperature 97.8 F (36.6 C), temperature source Temporal, resp. rate 16, height 5\' 7"  (1.702 m), weight 174 lb (78.926 kg).  Physical Exam Physical Exam WDWN in NAD HEENT:  EOMI, sclera anicteric Neck:  No masses, no thyromegaly Lungs:  CTA bilaterally; normal respiratory effort CV:  Regular rate and rhythm; no murmurs Abd:  +bowel sounds, soft,  non-tender, no masses Ext:  Well-perfused; no edema Skin:  Warm, dry; no sign of jaundice Right groin - 1 cm palpable subcutaneous nodule; just anterior to this is a 2 cm area of erythema and minimal induration with some drainage  Data Reviewed none  Assessment    Recurrent hidradenitis right groin     Plan    Excision of hidradenitis right groin.  The surgical procedure has been discussed with the patient.  Potential risks, benefits, alternative treatments, and expected outcomes have been explained.  All of the patient's questions at this time have been answered.  The likelihood of reaching the patient's treatment goal is good.  The patient understand the proposed surgical procedure and wishes to proceed.        Ellissa Ayo K. 10/22/2011, 11:38 AM

## 2011-10-30 NOTE — Interval H&P Note (Signed)
History and Physical Interval Note:  10/30/2011 7:29 AM  Claire Caldwell  has presented today for surgery, with the diagnosis of recurrent hidradenitis right groin  The various methods of treatment have been discussed with the patient and family. After consideration of risks, benefits and other options for treatment, the patient has consented to  Procedure(s) (LRB) with comments: EXCISION HYDRADENITIS GROIN (Right) as a surgical intervention .  The patient's history has been reviewed, patient examined, no change in status, stable for surgery.  I have reviewed the patient's chart and labs.  Questions were answered to the patient's satisfaction.     Kahil Agner K.

## 2011-10-30 NOTE — Anesthesia Procedure Notes (Signed)
Procedure Name: LMA Insertion Date/Time: 10/30/2011 7:41 AM Performed by: Gar Gibbon Pre-anesthesia Checklist: Patient identified, Emergency Drugs available, Suction available and Patient being monitored Patient Re-evaluated:Patient Re-evaluated prior to inductionOxygen Delivery Method: Circle System Utilized Preoxygenation: Pre-oxygenation with 100% oxygen Intubation Type: IV induction Ventilation: Mask ventilation without difficulty LMA: LMA inserted LMA Size: 4.0 Number of attempts: 1 Airway Equipment and Method: bite block Placement Confirmation: positive ETCO2 Tube secured with: Tape Dental Injury: Teeth and Oropharynx as per pre-operative assessment

## 2011-10-30 NOTE — Anesthesia Postprocedure Evaluation (Signed)
  Anesthesia Post-op Note  Patient: Claire Caldwell  Procedure(s) Performed: Procedure(s) (LRB) with comments: EXCISION HYDRADENITIS GROIN (Bilateral)  Patient Location: PACU  Anesthesia Type: General  Level of Consciousness: awake  Airway and Oxygen Therapy: Patient Spontanous Breathing and Patient connected to face mask oxygen  Post-op Pain: mild  Post-op Assessment: Post-op Vital signs reviewed  Post-op Vital Signs: Reviewed  Complications: No apparent anesthesia complications

## 2011-10-30 NOTE — Progress Notes (Signed)
5 separate incisions on bilateral groin sites. All covered with dermabond.  3 on right side, and 2 on left side.  All sites clean dry intact with adhesive on sites.

## 2011-10-30 NOTE — Op Note (Signed)
Pre-op Diagnosis:  Recurrent hidradenitis - groin Post-op diagnosis:  Same Procedure:  Excision of recurrent hidradenitis of the groin Surgeon:  Keshara Kiger K. Anesthesia:  GEN - LMA Indications:  26 yo female with many years of hidradenitis s/p previous excision presents with several localized areas of recurrence.  She presents for excision.  Description of procedure:  The patient was brought to the operating room and placed in a supine position on the operating room table.  After an adequate level of general anesthesia was obtained, the patient's legs were placed in lithotomy position in yellowfin stirrups. Her perineum and both groins were prepped with Betadine and draped sterile fashion. A timeout was taken to ensure the proper patient proper procedure. We began examined the patient's right groin. She has a well-healed incision from her previous surgery. In the posterior part of the groin there is a 1 cm palpable cyst. There is another small subcutaneous palpable mass on the medial part of the groin more anteriorly. There is another small opening on the right side of the mons pubis. We created an elliptical incisions around each of these. We excised the skin and subcutaneous tissue down to the fat.  We used cautery for hemostasis. We then turned our attention to left groin. There are 2 areas that are suspicious. Neither one of these was actively inflamed. No purulence was noted. However I couldn't palpate small masses in these areas and the patient had indicated that these were fairly tender. We excised both of these after infiltrated with quarter percent Marcaine with epinephrine. All 5 wounds were then closed with 4-0 Monocryl. Dermabond was used to seal the skin. All these have been infiltrated with quarter percent Marcaine with epinephrine. All the specimens were sent together for pathologic examination. The patient was then extubated and brought to recovery in stable condition. All sponge,  instrument, and needle counts are correct.  Wilmon Arms. Corliss Skains, MD, [facility]  10/30/2011 8:47 AM

## 2011-10-30 NOTE — Anesthesia Preprocedure Evaluation (Addendum)
Anesthesia Evaluation  Patient identified by MRN, date of birth, ID band Patient awake    Reviewed: Allergy & Precautions, H&P , NPO status , Patient's Chart, lab work & pertinent test results  Airway Mallampati: I TM Distance: >3 FB Neck ROM: Full    Dental  (+) Teeth Intact and Dental Advisory Given   Pulmonary Current Smoker,  breath sounds clear to auscultation        Cardiovascular Rhythm:Regular Rate:Normal     Neuro/Psych    GI/Hepatic   Endo/Other    Renal/GU      Musculoskeletal   Abdominal   Peds  Hematology   Anesthesia Other Findings No murmur heard  Reproductive/Obstetrics                           Anesthesia Physical Anesthesia Plan  ASA: II  Anesthesia Plan: General   Post-op Pain Management:    Induction: Intravenous  Airway Management Planned: LMA  Additional Equipment:   Intra-op Plan:   Post-operative Plan: Extubation in OR  Informed Consent:   Dental advisory given  Plan Discussed with: CRNA, Anesthesiologist and Surgeon  Anesthesia Plan Comments:         Anesthesia Quick Evaluation

## 2011-11-02 ENCOUNTER — Ambulatory Visit (HOSPITAL_BASED_OUTPATIENT_CLINIC_OR_DEPARTMENT_OTHER): Payer: Medicaid Other | Admitting: Physical Medicine & Rehabilitation

## 2011-11-02 ENCOUNTER — Ambulatory Visit (INDEPENDENT_AMBULATORY_CARE_PROVIDER_SITE_OTHER): Payer: Medicaid Other | Admitting: General Surgery

## 2011-11-02 ENCOUNTER — Encounter (INDEPENDENT_AMBULATORY_CARE_PROVIDER_SITE_OTHER): Payer: Self-pay | Admitting: General Surgery

## 2011-11-02 ENCOUNTER — Encounter: Payer: Self-pay | Admitting: Physical Medicine & Rehabilitation

## 2011-11-02 VITALS — BP 136/74 | HR 84 | Temp 97.2°F | Resp 16 | Ht 67.0 in | Wt 176.2 lb

## 2011-11-02 VITALS — BP 145/97 | HR 100 | Temp 97.0°F | Resp 14 | Ht 67.0 in | Wt 176.8 lb

## 2011-11-02 DIAGNOSIS — M76899 Other specified enthesopathies of unspecified lower limb, excluding foot: Secondary | ICD-10-CM

## 2011-11-02 DIAGNOSIS — M533 Sacrococcygeal disorders, not elsewhere classified: Secondary | ICD-10-CM

## 2011-11-02 DIAGNOSIS — S3140XA Unspecified open wound of vagina and vulva, initial encounter: Secondary | ICD-10-CM

## 2011-11-02 DIAGNOSIS — IMO0001 Reserved for inherently not codable concepts without codable children: Secondary | ICD-10-CM

## 2011-11-02 DIAGNOSIS — M7062 Trochanteric bursitis, left hip: Secondary | ICD-10-CM

## 2011-11-02 MED ORDER — OXYCODONE-ACETAMINOPHEN 10-325 MG PO TABS: 1.0000 | ORAL_TABLET | Freq: Four times a day (QID) | ORAL | Status: DC | PRN

## 2011-11-02 MED ORDER — DULOXETINE HCL 30 MG PO CPEP: 60.0000 mg | ORAL_CAPSULE | Freq: Every day | ORAL | Status: DC

## 2011-11-02 NOTE — Patient Instructions (Signed)
We have increased her Cymbalta to 60 mg a day We had an injection for trochanteric bursitis of the left hip today if it is helpful we can inject the right hip next month

## 2011-11-02 NOTE — Progress Notes (Signed)
The patient comes in with the complaint that her right groin wound in her left arm wound have opened up. One small area on the right side just lateral to her right AVM his open up about a 1 cm in size. There is no redness there is no purulent drainage.  1 on the left side is a small amount of drainage but has not opened up much at all. She describes a significant amount of pain associated with both these incisions. She states that the Percocet that she was prescribed is not helping.  I do not think that any antibiotics necessary at this time she can go ahead and shower and had her wounds dry and cover them up as needed. I will prescribe her some Percocet tablets 10/325 to help control her pain. For the Percocet tablets that she is currently she can take 2 of those every 4 hours as needed for pain. She should return within a couple weeks to see Dr. Manus Rudd.

## 2011-11-02 NOTE — Progress Notes (Signed)
Subjective:    Patient ID: Claire Caldwell, female    DOB: [birth year], 26 y.o.   MRN: [id number]  HPI Breast-feeding but states that all of her medications have been approved by her gynecologist. 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 General activity 6 Relation with others 6 Enjoyment of life 7 What TIME of day is your pain at its worst? evening Sleep (in general) Fair  Pain is worse with: walking, bending, sitting, inactivity and standing Pain improves with: heat/ice and medication Relief from Meds: 6  Mobility walk without assistance how many minutes can you walk? 15 ability to climb steps?  yes do you drive?  yes  Function not employed: date last employed   Neuro/Psych numbness tingling spasms  Prior Studies Any changes since last visit?  no  Physicians involved in your care Any changes since last visit?  no   Family History  Problem Relation Age of Onset  . Cancer Maternal Aunt     breast  . Cancer Maternal Uncle     lung   History   Social History  . Marital Status: Married    Spouse Name: N/A    Number of Children: N/A  . Years of Education: N/A   Social History Main Topics  . Smoking status: Current Every Day Smoker -- 0.5 packs/day for 8 years    Types: Cigarettes  . Smokeless tobacco: Never Used  . Alcohol Use: No  . Drug Use: No  . Sexually Active: Not Currently   Other Topics Concern  . None   Social History Narrative  . None   Past Surgical History  Procedure Date  . Tonsillectomy     & adenoids as a child  . Ear cyst excision 2007    bil ears  . Tumor removal 2009    left knee  . Tumor removal 2010    left foot  . Tumor removal 2011    right salivary gland    . Tumor removal 2011    left side of head  . Tubal ligation 09/05/2010    Procedure: ESSURE TUBAL STERILIZATION;  Surgeon: Roseanna Rainbow, MD;  Location: [facility];  Service: Gynecology;  Laterality: Left;  . Hysteroscopy w/ endometrial ablation 03/13/2011  . Dilation and curettage of uterus 03/13/2011  . Inguinal hidradenitis excision 09/30/2010    bilateral  . Irrigation and debridement sebaceous cyst 03/01/2009    exc. sebaceous cyst x 4 bilateral groin  . Salpingectomy 08/25/2006    laparoscopic - right   Past Medical History  Diagnosis Date  . Headache     migraines  . Hidradenitis 10/2011    right groin  . GERD (gastroesophageal reflux disease)     prilosec not reordered-  not taking x approx 3 weeks  . Fibromyalgia   . Arthritis     lower back  . Hematuria     chronic  . Simple cyst of kidney     states normal kidney function, except for hematuria   BP 145/97  Pulse 100  Temp 97 F (36.1 C)  Resp 14  Ht 5\' 7"  (1.702 m)  Wt 176 lb 12.8 oz (80.196 kg)  BMI 27.69 kg/m2  SpO2 97%    Review of Systems  Musculoskeletal: Positive for back pain.       Spasms  Neurological: Positive for numbness.       Tingling  All other systems reviewed and are negative.       Objective:   Physical Exam Left trochanteric bursa tenderness No joint swelling in the hands No facial erythema No tenderness over the thoracic paraspinals mild tenderness over the lumbosacral junction. Straight leg raising test is normal Lower tremor the strength is normal Gait is normal      Assessment & Plan:  1. Left trochanteric bursitis: She does not tolerate nonsteroidals. Will do injection today  2. Diffuse musculoskeletal aches she states that many family members have fibromyalgia. She also is worried that she could have lupus because her mother has it. She states the only treatment that her mother gets for lupus his pain medication. She also indicates she does not want to be on high  strength pain medications for chronic use. While I do not see any physical exam findings consistent with lupus I explained that I was not a rheumatologist and that her primary physician can refer her to a rheumatologist if there is a suspicion for SLE.  3. Back pain with essentially normal MRI. 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 Then a solution containing 1 cc of 40 November cc Depo-Medrol and 4 cc 1% lidocaine were injected. Patient tolerated to do well. Post procedure instructions given

## 2011-11-03 ENCOUNTER — Encounter (HOSPITAL_BASED_OUTPATIENT_CLINIC_OR_DEPARTMENT_OTHER): Payer: Self-pay | Admitting: Surgery

## 2011-11-07 ENCOUNTER — Encounter (HOSPITAL_COMMUNITY): Payer: Self-pay | Admitting: *Deleted

## 2011-11-07 ENCOUNTER — Emergency Department (HOSPITAL_COMMUNITY)
Admission: EM | Admit: 2011-11-07 | Discharge: 2011-11-07 | Discharge status: Against Medical Advice | Payer: Medicaid Other | Attending: Emergency Medicine | Admitting: Emergency Medicine

## 2011-11-07 DIAGNOSIS — R109 Unspecified abdominal pain: Secondary | ICD-10-CM | POA: Insufficient documentation

## 2011-11-07 DIAGNOSIS — Z5320 Procedure and treatment not carried out because of patient's decision for unspecified reasons: Secondary | ICD-10-CM | POA: Insufficient documentation

## 2011-11-07 DIAGNOSIS — N644 Mastodynia: Secondary | ICD-10-CM | POA: Insufficient documentation

## 2011-11-07 LAB — CBC WITH DIFFERENTIAL/PLATELET
Basophils Relative: 1 % (ref 0–1)
Hemoglobin: 13.9 g/dL (ref 12.0–15.0)
MCHC: 33.5 g/dL (ref 30.0–36.0)
Monocytes Relative: 8 % (ref 3–12)
Neutro Abs: 4.7 10*3/uL (ref 1.7–7.7)
Neutrophils Relative %: 46 % (ref 43–77)
Platelets: 252 10*3/uL (ref 150–400)
RBC: 4.74 MIL/uL (ref 3.87–5.11)

## 2011-11-07 LAB — COMPREHENSIVE METABOLIC PANEL
ALT: 51 U/L — ABNORMAL HIGH (ref 0–35)
AST: 36 U/L (ref 0–37)
Albumin: 3.6 g/dL (ref 3.5–5.2)
Alkaline Phosphatase: 110 U/L (ref 39–117)
Calcium: 9.5 mg/dL (ref 8.4–10.5)
Glucose, Bld: 108 mg/dL — ABNORMAL HIGH (ref 70–99)
Potassium: 3.8 mEq/L (ref 3.5–5.1)
Sodium: 137 mEq/L (ref 135–145)
Total Protein: 7.5 g/dL (ref 6.0–8.3)

## 2011-11-07 LAB — URINALYSIS, ROUTINE W REFLEX MICROSCOPIC
Leukocytes, UA: NEGATIVE
Nitrite: NEGATIVE
Specific Gravity, Urine: 1.034 — ABNORMAL HIGH (ref 1.005–1.030)
Urobilinogen, UA: 1 mg/dL (ref 0.0–1.0)

## 2011-11-07 LAB — URINE MICROSCOPIC-ADD ON

## 2011-11-07 LAB — ACETAMINOPHEN LEVEL: Acetaminophen (Tylenol), Serum: <15 ug/mL (ref 10–30)

## 2011-11-07 NOTE — ED Notes (Signed)
Pt states she can not stay any longer because her daughter feels sick, encouraged pt to stay but states she must go

## 2011-11-07 NOTE — ED Notes (Signed)
Pt has been over using tylenol to treat pain, not to intentionally harm

## 2011-11-07 NOTE — ED Notes (Signed)
Pt took 4 extra strength tylenol this am for fever

## 2011-11-07 NOTE — ED Notes (Signed)
Pt had cyst and hydronamitis from bilateral groins 8 days ago.  The following Tuesday started running fever 101.3.  Pt reports right breast pain, abdominal cramping, body aches.  No diarrhea or constipation.  No vaginal discharge or bleeding.  LMP- 2 years-- breastfeeding

## 2011-11-10 ENCOUNTER — Telehealth (INDEPENDENT_AMBULATORY_CARE_PROVIDER_SITE_OTHER): Payer: Self-pay

## 2011-11-10 NOTE — Telephone Encounter (Signed)
Pt called stating drainage color has not changed since she saw Dr Lindie Spruce and there is no swelling or fever. She wants suggestion on how to keep drainage from getting on underwear. I advised pt to use kotex type pad with wings and this should wick drainage from wd and protect clothing. She is to call with any concerns or if drainage changes, redness and or swelling occur.

## 2011-11-11 ENCOUNTER — Encounter (INDEPENDENT_AMBULATORY_CARE_PROVIDER_SITE_OTHER): Payer: Self-pay | Admitting: Surgery

## 2011-11-11 ENCOUNTER — Encounter (INDEPENDENT_AMBULATORY_CARE_PROVIDER_SITE_OTHER): Payer: Medicaid Other | Admitting: Surgery

## 2011-11-11 ENCOUNTER — Ambulatory Visit (INDEPENDENT_AMBULATORY_CARE_PROVIDER_SITE_OTHER): Payer: Medicaid Other | Admitting: Surgery

## 2011-11-11 VITALS — BP 130/82 | HR 72 | Temp 98.0°F | Resp 16 | Ht 67.0 in | Wt 173.2 lb

## 2011-11-11 DIAGNOSIS — L732 Hidradenitis suppurativa: Secondary | ICD-10-CM

## 2011-11-11 MED ORDER — OXYCODONE-ACETAMINOPHEN 5-325 MG PO TABS: 1.0000 | ORAL_TABLET | ORAL | Status: DC | PRN

## 2011-11-11 NOTE — Progress Notes (Signed)
Status post excision of bilateral groin hidradenitis on 10/30/11.  Her incisions have had some superficial gapping. No sign of infection. All of them seem to be dry with no sign of drainage. I recommended that she continue with Neosporin and dry dressings over these areas to keep them clean. Recheck her in 3-4 weeks. Hopefully by that point these wounds are all completely healed. I did refill her pain medication  Wilmon Arms. Corliss Skains, MD, [facility]  11/11/2011 10:36 AM

## 2011-11-18 ENCOUNTER — Telehealth (INDEPENDENT_AMBULATORY_CARE_PROVIDER_SITE_OTHER): Payer: Self-pay | Admitting: General Surgery

## 2011-11-18 NOTE — Telephone Encounter (Signed)
Pt called in stating that she is s/p groin hidradenitis.  The says that the incision has green drainage coming from it and that she has been running a ever of 101.4.  I asked her if there was any redness to the wound and she denied any redness greater than right at the incision line.  She is not currently on any pain medications or antibiotics so she is only taking tylenol to help with the fever.  I gave her an appt to come in to see our urgent office doctor tomorrow.  She agreed to this.  I informed her that I would send this message to Dr. Corliss Skains just in case he wants to prescribe any antibiotics before then.

## 2011-11-18 NOTE — Telephone Encounter (Signed)
She usually responds to Cipro 500 mg PO BID x 1 week

## 2011-11-19 ENCOUNTER — Ambulatory Visit (INDEPENDENT_AMBULATORY_CARE_PROVIDER_SITE_OTHER): Payer: Medicaid Other | Admitting: Surgery

## 2011-11-19 ENCOUNTER — Encounter (INDEPENDENT_AMBULATORY_CARE_PROVIDER_SITE_OTHER): Payer: Self-pay | Admitting: Surgery

## 2011-11-19 ENCOUNTER — Telehealth (INDEPENDENT_AMBULATORY_CARE_PROVIDER_SITE_OTHER): Payer: Self-pay | Admitting: General Surgery

## 2011-11-19 VITALS — BP 134/80 | HR 68 | Temp 97.9°F | Resp 14 | Ht 67.0 in | Wt 175.0 lb

## 2011-11-19 DIAGNOSIS — Z09 Encounter for follow-up examination after completed treatment for conditions other than malignant neoplasm: Secondary | ICD-10-CM

## 2011-11-19 DIAGNOSIS — L732 Hidradenitis suppurativa: Secondary | ICD-10-CM

## 2011-11-19 MED ORDER — OXYCODONE-ACETAMINOPHEN 10-325 MG PO TABS: 1.0000 | ORAL_TABLET | Freq: Four times a day (QID) | ORAL | Status: DC | PRN

## 2011-11-19 NOTE — Progress Notes (Signed)
Chief complaint: Postop visit with increasing drainage from area of hidradenitis of the right groin  History of present illness: This patient is recent head surgical excision done in the area of the right groin but over the last couple days has developed a foul-smelling purulent drainage with increased pain. She comes here to the office today for evaluation. She is already started on some Cipro which seems to resolve these infections.  Exam: Vital signs:BP 134/80  Pulse 68  Temp 97.9 F (36.6 C) (Temporal)  Resp 14  Ht 5\' 7"  (1.702 m)  Wt 175 lb (79.379 kg)  BMI 27.41 kg/m2 General: Patient is alert oriented healthy appearing Groin: She has 3 open areas in the right groin area to have a little bit of purulent drainage. They're quite tender but there is no surrounding cellulitis. There does not appear to be any fluctuant mass or abscess. I think she has small shotty lymph nodes enlarged and tender.  Impression persistent hidradenitis was acute inflammation  Plan: To continue the antibiotics and local wound care. I'll give her a prescription for some Percocet 10/325 and have her come back as scheduled in December but sooner if this does not improve over the weekend.

## 2011-11-19 NOTE — Patient Instructions (Signed)
Continue to take the antibiotics that you were just prescribed. If you're not better by early next week come back for another checkup. Otherwise, continue to have your appointment in December as scheduled.

## 2011-11-19 NOTE — Telephone Encounter (Signed)
Patient is aware of Rx at [facility] on [address]. I called in Cipro 500 mg PO BID x 1 week and I spoke to [place] at the Pharmacy. Pt also told me that she is coming in the urgent office to see Dr Jamey Ripa today 11-19-2011. And I told her to keep the apt with Dr Jamey Ripa and she has an up coming apt with Dr Corliss Skains on 12-08-2011

## 2011-11-25 ENCOUNTER — Telehealth (INDEPENDENT_AMBULATORY_CARE_PROVIDER_SITE_OTHER): Payer: Self-pay | Admitting: General Surgery

## 2011-11-25 NOTE — Telephone Encounter (Signed)
Pt called to report site of groin surgery for hidradenitis is again sore and slightly red.  Denies fever or swelling at this time.  Paged Dr. Corliss Skains for advice (office visit or more antibiotics.)

## 2011-11-27 ENCOUNTER — Telehealth (INDEPENDENT_AMBULATORY_CARE_PROVIDER_SITE_OTHER): Payer: Self-pay | Admitting: General Surgery

## 2011-11-27 NOTE — Telephone Encounter (Signed)
Pt called to report she has a new knot/ abscess developing in her groin.  Post op appt on 12/08/11.  No fever yet.  Paged and updated Dr. Corliss Skains.  Ordered Cipro 500 mg, # 20, 1 po BID x 10 days, no refill; meds called to [facility]:  [phone].  Pt is aware and further instructed to call for urgent office appt on Monday if not improved on antibiotics.

## 2011-11-30 ENCOUNTER — Ambulatory Visit (HOSPITAL_BASED_OUTPATIENT_CLINIC_OR_DEPARTMENT_OTHER): Payer: Medicaid Other | Admitting: Physical Medicine & Rehabilitation

## 2011-11-30 ENCOUNTER — Encounter: Payer: Self-pay | Admitting: Physical Medicine & Rehabilitation

## 2011-11-30 ENCOUNTER — Telehealth (INDEPENDENT_AMBULATORY_CARE_PROVIDER_SITE_OTHER): Payer: Self-pay | Admitting: General Surgery

## 2011-11-30 ENCOUNTER — Encounter: Payer: Medicaid Other | Attending: Physical Medicine & Rehabilitation

## 2011-11-30 VITALS — BP 155/93 | HR 83 | Resp 16 | Ht 67.0 in | Wt 176.8 lb

## 2011-11-30 DIAGNOSIS — G5700 Lesion of sciatic nerve, unspecified lower limb: Secondary | ICD-10-CM

## 2011-11-30 DIAGNOSIS — M76899 Other specified enthesopathies of unspecified lower limb, excluding foot: Secondary | ICD-10-CM | POA: Insufficient documentation

## 2011-11-30 DIAGNOSIS — G5702 Lesion of sciatic nerve, left lower limb: Secondary | ICD-10-CM

## 2011-11-30 DIAGNOSIS — IMO0001 Reserved for inherently not codable concepts without codable children: Secondary | ICD-10-CM

## 2011-11-30 MED ORDER — PREGABALIN 50 MG PO CAPS: 50.0000 mg | ORAL_CAPSULE | Freq: Two times a day (BID) | ORAL | Status: DC

## 2011-11-30 NOTE — Telephone Encounter (Signed)
Patient called in wanting to know if she could see Dr. Corliss Skains today or tomorrow. I advised he is out of the office at the hospital with scheduled surgeries. Confirmed patient already has an appointment to see him on 12/08/11.

## 2011-11-30 NOTE — Telephone Encounter (Signed)
Pt called for earlier appt, but none available.  She is taking Cipro, but 2 new knots are coming up and the one in her Rt groin is worse.  Pt asking today for pain med and if she needs to be on stronger or different antibiotic to keep them from developing.  Dr. Corliss Skains paged and updated.  Will add pt to Urgent office on Wed.

## 2011-11-30 NOTE — Patient Instructions (Signed)
Will start Lyrica for fibromyalgia pain as well as nerve irritation pain Will check for nerve damage with an EMG test next visit Do not exceed 8 extra strength Tylenol or 12 regular strength Tylenol per day You may continue Cymbalta

## 2011-11-30 NOTE — Progress Notes (Signed)
Subjective:    Patient ID: Claire Caldwell, female    DOB: 1985-01-20, 26 y.o.   MRN: [id number]  HPI Greater trochanter injection helped with hip pain Continues to have pain down the left leg into the foot. MRI of the spine was negative. Has not had EMG Pain Inventory Average Pain 8 Pain Right Now 7 My pain is burning, stabbing, tingling and aching  In the last 24 hours, has pain interfered with the following? General activity 7 Relation with others 7 Enjoyment of life 7 What TIME of day is your pain at its worst? morning and night Sleep (in general) Poor  Pain is worse with: walking, bending, sitting, inactivity, standing and some activites Pain improves with: rest and medication Relief from Meds: 5  Mobility walk without assistance how many minutes can you walk? 5 ability to climb steps?  yes do you drive?  yes transfers alone Do you have any goals in this area?  yes  Function not employed: date last employed  Do you have any goals in this area?  no  Neuro/Psych weakness numbness tingling spasms  Prior Studies Any changes since last visit?  no  Physicians involved in your care Any changes since last visit?  no   Family History  Problem Relation Age of Onset  . Cancer Maternal Aunt     breast  . Cancer Maternal Uncle     lung   History   Social History  . Marital Status: Married    Spouse Name: N/A    Number of Children: N/A  . Years of Education: N/A   Social History Main Topics  . Smoking status: Current Every Day Smoker -- 0.5 packs/day for 8 years    Types: Cigarettes  . Smokeless tobacco: Never Used  . Alcohol Use: No  . Drug Use: No  . Sexually Active: Not Currently   Other Topics Concern  . None   Social History Narrative  . None   Past Surgical History  Procedure Date  . Tonsillectomy     & adenoids as a child  . Ear cyst excision 2007    bil ears  . Tumor removal 2009    left knee  . Tumor removal 2010    left foot  .  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 Tumor removal 2011    left side of head  . Tubal ligation 09/05/2010    Procedure: ESSURE TUBAL STERILIZATION;  Surgeon: Roseanna Rainbow, MD;  Location: [facility];  Service: Gynecology;  Laterality: Left;  . Hysteroscopy w/ endometrial ablation 03/13/2011  . Dilation and curettage of uterus 03/13/2011  . Inguinal hidradenitis excision 09/30/2010    bilateral  . Irrigation and debridement sebaceous cyst 03/01/2009    exc. sebaceous cyst x 4 bilateral groin  . Salpingectomy 08/25/2006    laparoscopic - right  . Hydradenitis excision 10/30/2011    Procedure: EXCISION HYDRADENITIS GROIN;  Surgeon: Wilmon Arms. Corliss Skains, MD;  Location: [facility];  Service: General;  Laterality: Bilateral;   Past Medical History  Diagnosis Date  . Headache     migraines  . Hidradenitis 10/2011    right groin  . GERD (gastroesophageal reflux disease)     prilosec not reordered-  not taking x approx 3 weeks  . Fibromyalgia   . Arthritis     lower back  . Hematuria     chronic  . Simple cyst of kidney     states  normal kidney function, except for hematuria   BP 155/93  Pulse 83  Resp 16  Ht 5\' 7"  (1.702 m)  Wt 176 lb 12.8 oz (80.196 kg)  BMI 27.69 kg/m2  SpO2 99%    Review of Systems  Musculoskeletal: Positive for myalgias, back pain and arthralgias.  Neurological: Positive for weakness and numbness.       Tingling and spasms  All other systems reviewed and are negative.       Objective:   Physical Exam  Constitutional: She is oriented to person, place, and time. She appears well-developed and well-nourished.  HENT:  Head: Normocephalic and atraumatic.  Eyes: Conjunctivae normal and EOM are normal. Pupils are equal, round, and reactive to light.  Musculoskeletal:       Right hip: Normal.       Left hip: She exhibits decreased range of motion.       Lumbar back: She exhibits tenderness and pain. She exhibits no bony tenderness and  no deformity.       Tenderness over the left gluteus maximus muscle No tenderness over the hip laterally Mild pain with internal rotation of the left hip otherwise normal  Neurological: She is alert and oriented to person, place, and time. She has normal strength. No sensory deficit. Gait normal.  Psychiatric: She has a normal mood and affect.          Assessment & Plan:  1. Chronic left Sciatic-type discomfort however no signs of herniated nucleus pulposus or nerve root irritation on MRI lumbar spine. Differential includes pyriformis syndrome we'll check EMG 2.  Trochanteric bursitis improved 3.Fibromyalgia syndrome will trial Lyrica, Will need to clear with your gynecologist regarding safety from breast-feeding if you continue to breast-feed

## 2011-12-02 ENCOUNTER — Ambulatory Visit (INDEPENDENT_AMBULATORY_CARE_PROVIDER_SITE_OTHER): Payer: Medicaid Other | Admitting: Surgery

## 2011-12-02 ENCOUNTER — Encounter (INDEPENDENT_AMBULATORY_CARE_PROVIDER_SITE_OTHER): Payer: Self-pay | Admitting: Surgery

## 2011-12-02 VITALS — BP 142/102 | HR 92 | Temp 98.2°F | Ht 67.0 in | Wt 176.0 lb

## 2011-12-02 DIAGNOSIS — L732 Hidradenitis suppurativa: Secondary | ICD-10-CM

## 2011-12-02 MED ORDER — HYDROCODONE-ACETAMINOPHEN 5-500 MG PO TABS: 1.0000 | ORAL_TABLET | ORAL | Status: DC | PRN

## 2011-12-02 MED ORDER — HYDROCODONE-ACETAMINOPHEN 5-500 MG PO TABS: 1.0000 | ORAL_TABLET | Freq: Four times a day (QID) | ORAL | Status: DC | PRN

## 2011-12-02 NOTE — Progress Notes (Signed)
The patient comes in with 3 new spots that are mildly painful. Her incisions are all well-healed. These areas are not tender and showed no sign of drainage. She has a small area on her mons that measures about 3 mm across and is tender. She has an area on her right labia associated with a hair follicle that is mildly irritated. She has another on her left groin. All 3 of these areas show ingrown hairs. I plucked all of these out and he should improve without further antibiotic treatment. She states that this pain felt different than her typical hidradenitis. We will see her back as needed.  Wilmon Arms. Corliss Skains, MD, [facility]  12/02/2011 5:30 PM

## 2011-12-08 ENCOUNTER — Encounter (INDEPENDENT_AMBULATORY_CARE_PROVIDER_SITE_OTHER): Payer: Medicaid Other | Admitting: Surgery

## 2011-12-10 ENCOUNTER — Other Ambulatory Visit: Payer: Self-pay | Admitting: Physical Medicine & Rehabilitation

## 2011-12-10 ENCOUNTER — Ambulatory Visit (INDEPENDENT_AMBULATORY_CARE_PROVIDER_SITE_OTHER): Payer: Medicaid Other | Admitting: Surgery

## 2011-12-10 VITALS — BP 132/64 | HR 66 | Resp 18 | Ht 65.0 in | Wt 177.8 lb

## 2011-12-10 DIAGNOSIS — L732 Hidradenitis suppurativa: Secondary | ICD-10-CM

## 2011-12-10 NOTE — Progress Notes (Signed)
[facility]  Claire Kin, MD,  FACS [address]  [address] [place], [place]    [address] Phone:  [phone] FAX:  [phone]   Re:   Claire Caldwell DOB:   1985/01/28 MRN:   [id number]  ASSESSMENT AND PLAN: 1.  Recurrent hidradenitis   Right groin abscess.  I did an I&D in the office of the nodular abscess.  She does not need antibiotics.  I gave her #20 of Percocet.  She will see Dr. Corliss Caldwell in 1 to 2 weeks for follow up.  HISTORY OF PRESENT ILLNESS: Chief Complaint  Patient presents with  . Follow-up    abs rt groin   Claire Caldwell is Caldwell 26 y.o. (DOB: 01-17-1985)  white female who is Caldwell patient of Claire A, MD and comes to me today for right groin pain/abscess.  She saw Dr. Corliss Caldwell on 12/02/2011 for 3 areas of ingrown hairs.  She saw Dr. Jamey Caldwell hidradenitis of the right groin.  She had bilateral groin hidradenitis excised 10/30/2011 by Dr. Corliss Caldwell.  She now has Caldwell point tenderness in the medial right groin/thigh junction.  Past Medical History  Diagnosis Date  . Headache     migraines  . Hidradenitis 10/2011    right groin  . GERD (gastroesophageal reflux disease)     prilosec not reordered-  not taking x approx 3 weeks  . Fibromyalgia   . Arthritis     lower back  . Hematuria     chronic  . Simple cyst of kidney     states normal kidney function, except for hematuria   Social History Has 4 children.  Not working.  Has fibromyalgia.  PHYSICAL EXAM: BP 132/64  Pulse 66  Resp 18  Ht 5\' 5"  (1.651 m)  Wt 177 lb 12.8 oz (80.65 kg)  BMI 29.59 kg/m2  Right groin/thigh:  She has Caldwell 1 cm nodule/abscess that is tender and red.  Procedure:  Right groin/medial thigh painted with betadine.  I infiltrated about 6 cc and excised Caldwell tender nodule about 1.0 cm.  The wound was dressed with gauze.  She will wash and soak this at home.  DATA REVIEWED: Epic notes.  Claire Kin, MD, FACS Office:  [phone]

## 2011-12-18 ENCOUNTER — Ambulatory Visit (INDEPENDENT_AMBULATORY_CARE_PROVIDER_SITE_OTHER): Payer: Medicaid Other | Admitting: Surgery

## 2011-12-18 ENCOUNTER — Encounter (INDEPENDENT_AMBULATORY_CARE_PROVIDER_SITE_OTHER): Payer: Self-pay | Admitting: Surgery

## 2011-12-18 VITALS — BP 136/80 | HR 74 | Temp 98.1°F | Resp 16 | Ht 67.0 in | Wt 177.1 lb

## 2011-12-18 DIAGNOSIS — L732 Hidradenitis suppurativa: Secondary | ICD-10-CM

## 2011-12-18 MED ORDER — CIPROFLOXACIN HCL 500 MG PO TABS: 500.0000 mg | ORAL_TABLET | Freq: Two times a day (BID) | ORAL | Status: DC

## 2011-12-18 MED ORDER — OXYCODONE-ACETAMINOPHEN 5-325 MG PO TABS: 1.0000 | ORAL_TABLET | ORAL | Status: DC | PRN

## 2011-12-18 NOTE — Progress Notes (Signed)
Status post incision and drainage of an infected sebaceous cyst of the right groin by Dr. Ezzard Standing the last week. She comes in today for evaluation. This area seems to be healing well. In the left groin there is a small painful area. Minimal surrounding cellulitis. We prepped this area with Betadine and anesthetized with 1% lidocaine. I made a 5 mm incision and excised a small hair follicle. She will treat this area with a dry dressing. I refilled herself pro as well as her Percocet. We'll recheck in 3-4 weeks.  Wilmon Arms. Corliss Skains, MD, [facility]  12/18/2011 11:41 AM

## 2011-12-21 ENCOUNTER — Ambulatory Visit: Payer: Medicaid Other | Admitting: Physical Medicine & Rehabilitation

## 2011-12-28 ENCOUNTER — Telehealth (INDEPENDENT_AMBULATORY_CARE_PROVIDER_SITE_OTHER): Payer: Self-pay | Admitting: General Surgery

## 2011-12-28 NOTE — Telephone Encounter (Signed)
Per VO Dr. Derrell Lolling, called in Hydrocodone 5/325 mg,  # 30, 1-2 po Q4-6H prn pain, no refill to the [facility]:  [phone].

## 2011-12-28 NOTE — Telephone Encounter (Signed)
Patient called in requesting additional pain medication to be issued. Patient said she last saw Dr. Corliss Skains a couple of weeks ago and would not be able to pick up the medication because her son is in the hospital sick with pneumonia. I advised her request would be forwarded to the urgent office doctor this afternoon who will review her chart and if approved the prescription will be issued. Patient agreed and stated that if a written prescription she would have to send someone to pick it up for her. Advised they would require identification to pick up if that is the case.

## 2012-01-01 ENCOUNTER — Encounter: Payer: Medicaid Other | Attending: Physical Medicine & Rehabilitation

## 2012-01-01 ENCOUNTER — Ambulatory Visit (HOSPITAL_BASED_OUTPATIENT_CLINIC_OR_DEPARTMENT_OTHER): Payer: Medicaid Other | Admitting: Physical Medicine & Rehabilitation

## 2012-01-01 ENCOUNTER — Encounter: Payer: Self-pay | Admitting: Physical Medicine & Rehabilitation

## 2012-01-01 VITALS — BP 142/97 | HR 86 | Resp 14 | Ht 67.0 in | Wt 180.0 lb

## 2012-01-01 DIAGNOSIS — M79609 Pain in unspecified limb: Secondary | ICD-10-CM

## 2012-01-01 DIAGNOSIS — M76899 Other specified enthesopathies of unspecified lower limb, excluding foot: Secondary | ICD-10-CM | POA: Insufficient documentation

## 2012-01-01 DIAGNOSIS — R209 Unspecified disturbances of skin sensation: Secondary | ICD-10-CM

## 2012-01-01 MED ORDER — TRAMADOL HCL 50 MG PO TABS: 50.0000 mg | ORAL_TABLET | Freq: Four times a day (QID) | ORAL | Status: DC | PRN

## 2012-01-01 MED ORDER — CYCLOBENZAPRINE HCL 5 MG PO TABS: 5.0000 mg | ORAL_TABLET | ORAL | Status: DC | PRN

## 2012-01-01 NOTE — Progress Notes (Signed)
EMG performed 01/01/2012.  See EMG report under media tab. Chronic left Sciatic-type discomfort however no signs of herniated nucleus pulposus or nerve root irritation on MRI lumbar spine. Differential includes pyriformis syndrome we'll check EMG

## 2012-01-01 NOTE — Patient Instructions (Signed)
Start tramadol 50 mg 3 times a day Flexeril is that your pharmacy. Please use this on a as-needed basis rather than 3 times a day I'll see back in 2 months

## 2012-01-13 ENCOUNTER — Encounter (INDEPENDENT_AMBULATORY_CARE_PROVIDER_SITE_OTHER): Payer: Medicaid Other | Admitting: Surgery

## 2012-01-15 ENCOUNTER — Encounter (INDEPENDENT_AMBULATORY_CARE_PROVIDER_SITE_OTHER): Payer: Self-pay | Admitting: Surgery

## 2012-01-15 ENCOUNTER — Ambulatory Visit (INDEPENDENT_AMBULATORY_CARE_PROVIDER_SITE_OTHER): Payer: Medicaid Other | Admitting: Surgery

## 2012-01-15 VITALS — BP 122/70 | HR 70 | Temp 98.0°F | Resp 14 | Ht 67.0 in | Wt 181.0 lb

## 2012-01-15 DIAGNOSIS — K602 Anal fissure, unspecified: Secondary | ICD-10-CM

## 2012-01-15 DIAGNOSIS — L732 Hidradenitis suppurativa: Secondary | ICD-10-CM

## 2012-01-15 MED ORDER — CIPROFLOXACIN HCL 500 MG PO TABS: 500.0000 mg | ORAL_TABLET | Freq: Two times a day (BID) | ORAL | Status: DC

## 2012-01-15 NOTE — Progress Notes (Signed)
The patient currently is having no problems with hidradenitis. She has had a couple of small flareups in both groins but has not had any problems about a week. She is having pain and burning as well as itching in the perirectal region. She is concerned about hemorrhoid disease. She asked for an examination here.  Both groins were free of any active infection. All of her scar should be well healed. No palpable masses. Her rectal examination shows no sign of external hemorrhoid disease. She has a small posterior midline fissure. On digital rectal examination she does have some small internal hemorrhoids but none of these are bleeding and none of these prolapse.  Impression bilateral groin hidradenitis Posterior midline anal fissure  I gave her refill of her Cipro 2 have on standby for a flare up of her hidradenitis. We will treat the anal fissure with stool softeners, sitz baths, and diltiazem ointment. Recheck in 3-4 weeks.  Wilmon Arms. Corliss Skains, MD, [facility]  01/15/2012 12:38 PM

## 2012-01-17 ENCOUNTER — Emergency Department (HOSPITAL_COMMUNITY)
Admission: EM | Admit: 2012-01-17 | Discharge: 2012-01-17 | Disposition: A | Discharge status: Home / Self Care | Payer: Medicaid Other | Attending: Emergency Medicine | Admitting: Emergency Medicine

## 2012-01-17 DIAGNOSIS — Z8679 Personal history of other diseases of the circulatory system: Secondary | ICD-10-CM | POA: Insufficient documentation

## 2012-01-17 DIAGNOSIS — F17200 Nicotine dependence, unspecified, uncomplicated: Secondary | ICD-10-CM | POA: Insufficient documentation

## 2012-01-17 DIAGNOSIS — Z79899 Other long term (current) drug therapy: Secondary | ICD-10-CM | POA: Insufficient documentation

## 2012-01-17 DIAGNOSIS — K0889 Other specified disorders of teeth and supporting structures: Secondary | ICD-10-CM

## 2012-01-17 DIAGNOSIS — K089 Disorder of teeth and supporting structures, unspecified: Secondary | ICD-10-CM | POA: Insufficient documentation

## 2012-01-17 DIAGNOSIS — K219 Gastro-esophageal reflux disease without esophagitis: Secondary | ICD-10-CM | POA: Insufficient documentation

## 2012-01-17 DIAGNOSIS — Z8739 Personal history of other diseases of the musculoskeletal system and connective tissue: Secondary | ICD-10-CM | POA: Insufficient documentation

## 2012-01-17 DIAGNOSIS — Z87448 Personal history of other diseases of urinary system: Secondary | ICD-10-CM | POA: Insufficient documentation

## 2012-01-17 MED ORDER — HYDROCODONE-ACETAMINOPHEN 5-325 MG PO TABS: 1.0000 | ORAL_TABLET | Freq: Four times a day (QID) | ORAL | Status: DC | PRN

## 2012-01-17 MED ORDER — OXYCODONE-ACETAMINOPHEN 5-325 MG PO TABS
2.0000 | ORAL_TABLET | Freq: Once | ORAL | Status: AC
  Administered 2012-01-17: 2 via ORAL
  Filled 2012-01-17: qty 2

## 2012-01-17 MED ORDER — SULFAMETHOXAZOLE-TRIMETHOPRIM 800-160 MG PO TABS: 1.0000 | ORAL_TABLET | Freq: Two times a day (BID) | ORAL | Status: DC

## 2012-01-17 MED ORDER — PENICILLIN V POTASSIUM 500 MG PO TABS: 500.0000 mg | ORAL_TABLET | Freq: Three times a day (TID) | ORAL | Status: DC

## 2012-01-17 NOTE — ED Provider Notes (Signed)
History     CSN: [id number]  Arrival date & time 01/17/12  0840   First MD Initiated Contact with Patient 01/17/12 [phone]      Chief Complaint  Patient presents with  . Otalgia    (Consider location/radiation/quality/duration/timing/severity/associated sxs/prior treatment) HPI Comments: This is a 27 year old female, past medical history remarkable for hidradenitis, who presents emergency department with chief complaint of constant, moderate to severe, otalgia. Patient states that she has had the ear pain times one week. Additionally, she complains of a bump in her left ear. She states that she has not had this pain before. She's been taking Cipro since the appearance of the bump in her ear. She has not tried any other treatments. There no aggravating or alleviating factors. She recently had a tooth pulled. She denies fever, chills, nausea, and vomiting.  The history is provided by the patient. No language interpreter was used.    Past Medical History  Diagnosis Date  . Headache     migraines  . Hidradenitis 10/2011    right groin  . GERD (gastroesophageal reflux disease)     prilosec not reordered-  not taking x approx 3 weeks  . Fibromyalgia   . Arthritis     lower back  . Hematuria     chronic  . Simple cyst of kidney     states normal kidney function, except for hematuria    Past Surgical History  Procedure Date  . Tonsillectomy     & adenoids as a child  . Ear cyst excision 2007    bil ears  . Tumor removal 2009    left knee  . Tumor removal 2010    left foot  . 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 Tumor removal 2011    left side of head  . Tubal ligation 09/05/2010    Procedure: ESSURE TUBAL STERILIZATION;  Surgeon: Roseanna Rainbow, MD;  Location: [facility];  Service: Gynecology;  Laterality: Left;  . Hysteroscopy w/ endometrial ablation 03/13/2011  . Dilation and curettage of uterus 03/13/2011  . Inguinal hidradenitis excision 09/30/2010    bilateral    . Irrigation and debridement sebaceous cyst 03/01/2009    exc. sebaceous cyst x 4 bilateral groin  . Salpingectomy 08/25/2006    laparoscopic - right  . Hydradenitis excision 10/30/2011    Procedure: EXCISION HYDRADENITIS GROIN;  Surgeon: Wilmon Arms. Corliss Skains, MD;  Location: [facility];  Service: General;  Laterality: Bilateral;    Family History  Problem Relation Age of Onset  . Cancer Maternal Aunt     breast  . Cancer Maternal Uncle     lung    History  Substance Use Topics  . Smoking status: Current Every Day Smoker -- 0.5 packs/day for 8 years    Types: Cigarettes  . Smokeless tobacco: Never Used  . Alcohol Use: No    OB History    Grav Para Term Preterm Abortions TAB SAB Ect Mult Living   1               Review of Systems  All other systems reviewed and are negative.    Allergies  Ibuprofen; Meloxicam; and Tramadol  Home Medications   Current Outpatient Rx  Name  Route  Sig  Dispense  Refill  . ACETAMINOPHEN 500 MG PO TABS   Oral   Take 2,000 mg by mouth every 6 (six) hours as needed. For ear pain.         Marland Kitchen  CYCLOBENZAPRINE HCL 5 MG PO TABS   Oral   Take 1 tablet (5 mg total) by mouth as needed. Muscle spasms   30 tablet   1   . DULOXETINE HCL 30 MG PO CPEP   Oral   Take 30 mg by mouth daily.         Marland Kitchen PREGABALIN 50 MG PO CAPS   Oral   Take 1 capsule (50 mg total) by mouth 2 (two) times daily.   60 capsule   1   . CIPROFLOXACIN HCL 500 MG PO TABS   Oral   Take 1 tablet (500 mg total) by mouth 2 (two) times daily.   14 tablet   3   . OMEPRAZOLE 20 MG PO CPDR   Oral   Take 20 mg by mouth daily.           There were no vitals taken for this visit.  Physical Exam  Nursing note and vitals reviewed. Constitutional: She is oriented to person, place, and time. She appears well-developed and well-nourished.  HENT:  Head: Normocephalic and atraumatic.       Tympanic membranes were clearly visualized, with anterior cone of  light, no signs of redness or infection, no bulging or fluid behind the membrane. No signs of infection and the external ear canal. Patient has small area of redness on the exterior auricle, but it is without fluctuance. Patient has poor dentition throughout, recently extracted left lower rear molar.  Eyes: Conjunctivae normal and EOM are normal.  Neck: Normal range of motion.  Cardiovascular: Normal rate.   Pulmonary/Chest: Effort normal.  Abdominal: She exhibits no distension.  Musculoskeletal: Normal range of motion.  Neurological: She is alert and oriented to person, place, and time.  Skin: Skin is dry.  Psychiatric: She has a normal mood and affect. Her behavior is normal. Judgment and thought content normal.    ED Course  Procedures (including critical care time)  Labs Reviewed - No data to display No results found.   1. Pain, dental       MDM  27 year old female with otalgia, I believe the patient's pain to be stemming from dental origin. Patient recently had a tooth pulled. She has poor dentition throughout. Pain radiates from her teeth through the mandible to her ears. Allergies the patient with pain medicine as well as antibiotics for dental abscess. Will also give the patient Bactrim for the possible developing abscess on the external left ear. Patient understands and agrees with the plan. She will followup with her dentist and her primary care provider. She is stable and ready for discharge.        Roxy Horseman, PA-C 01/17/12 [phone]

## 2012-01-17 NOTE — ED Notes (Signed)
MD to bedside.

## 2012-01-17 NOTE — ED Provider Notes (Signed)
Medical screening examination/treatment/procedure(s) were performed by non-physician practitioner and as supervising physician I was immediately available for consultation/collaboration.    Glynn Octave, MD 01/17/12 [phone]

## 2012-01-17 NOTE — ED Notes (Signed)
Pt c/o bump in left ear x 1 week. Also c/o pain in right ear

## 2012-01-18 ENCOUNTER — Telehealth (INDEPENDENT_AMBULATORY_CARE_PROVIDER_SITE_OTHER): Payer: Self-pay | Admitting: General Surgery

## 2012-01-18 ENCOUNTER — Telehealth (INDEPENDENT_AMBULATORY_CARE_PROVIDER_SITE_OTHER): Payer: Self-pay

## 2012-01-18 NOTE — Telephone Encounter (Signed)
The pt called to report she has a boil on the inside of her ear, not real deep.  She sees Dr Corliss Skains for hidradenitis.  I told her Dr Corliss Skains doesn't work on ears and she should go to an ear nose and throat specialist.  She thinks it will take too long to get in to see one and wants to know what Dr Corliss Skains thinks.

## 2012-01-18 NOTE — Telephone Encounter (Signed)
Called patient back to let her know that if she is having boil inside her ear and I told her that I spoke with Dr Corliss Skains about it and he did state that she needs to see a ENT Doctor for boils inside of her ears and patient is ok with that .

## 2012-01-25 ENCOUNTER — Encounter (INDEPENDENT_AMBULATORY_CARE_PROVIDER_SITE_OTHER): Payer: Self-pay | Admitting: General Surgery

## 2012-01-25 ENCOUNTER — Ambulatory Visit (INDEPENDENT_AMBULATORY_CARE_PROVIDER_SITE_OTHER): Payer: Medicaid Other | Admitting: General Surgery

## 2012-01-25 VITALS — BP 132/78 | HR 86 | Temp 98.6°F | Resp 18 | Ht 67.0 in | Wt 181.0 lb

## 2012-01-25 DIAGNOSIS — L732 Hidradenitis suppurativa: Secondary | ICD-10-CM

## 2012-01-25 MED ORDER — OXYCODONE-ACETAMINOPHEN 7.5-500 MG PO TABS: 1.0000 | ORAL_TABLET | Freq: Four times a day (QID) | ORAL | Status: DC | PRN

## 2012-01-25 NOTE — Progress Notes (Signed)
The patient comes in with very mild hidradenitis of the right mons pubis laterally.  There is a very mildly fluctuant area with minimal redness on the right lateral aspect of the mons pubis. The other areas appear to have healed very well. Because of the recurrence of this problem the patient was instructed to restart her ciprofloxacin and I will give her 20 tablets of Percocet for pain we'll make an appointment for her to come back and see Dr. Manus Rudd at his next available appointment.

## 2012-02-03 ENCOUNTER — Other Ambulatory Visit: Payer: Self-pay | Admitting: Physical Medicine & Rehabilitation

## 2012-02-03 NOTE — Telephone Encounter (Signed)
Per your last note you were trialing Lyrica and she needed OB-GYN approval. Do you want to refill?

## 2012-02-03 NOTE — Telephone Encounter (Signed)
Don't need approval from Gyn unless pt is pregnant Otherwise refill Lyrica 50mg  TID

## 2012-02-05 ENCOUNTER — Other Ambulatory Visit: Payer: Self-pay | Admitting: Otolaryngology

## 2012-02-05 DIAGNOSIS — L723 Sebaceous cyst: Secondary | ICD-10-CM

## 2012-02-05 DIAGNOSIS — R220 Localized swelling, mass and lump, head: Secondary | ICD-10-CM

## 2012-02-10 ENCOUNTER — Encounter (INDEPENDENT_AMBULATORY_CARE_PROVIDER_SITE_OTHER): Payer: Self-pay | Admitting: Surgery

## 2012-02-10 ENCOUNTER — Ambulatory Visit (INDEPENDENT_AMBULATORY_CARE_PROVIDER_SITE_OTHER): Payer: Medicaid Other | Admitting: Surgery

## 2012-02-10 ENCOUNTER — Ambulatory Visit
Admission: RE | Admit: 2012-02-10 | Discharge: 2012-02-10 | Disposition: A | Discharge status: Home / Self Care | Payer: Medicaid Other | Source: Ambulatory Visit | Attending: Otolaryngology | Admitting: Otolaryngology

## 2012-02-10 VITALS — BP 136/94 | HR 89 | Temp 98.8°F | Ht 67.0 in | Wt 188.2 lb

## 2012-02-10 DIAGNOSIS — R220 Localized swelling, mass and lump, head: Secondary | ICD-10-CM

## 2012-02-10 DIAGNOSIS — L723 Sebaceous cyst: Secondary | ICD-10-CM

## 2012-02-10 DIAGNOSIS — L732 Hidradenitis suppurativa: Secondary | ICD-10-CM

## 2012-02-10 MED ORDER — OXYCODONE-ACETAMINOPHEN 7.5-500 MG PO TABS: 1.0000 | ORAL_TABLET | Freq: Four times a day (QID) | ORAL | Status: DC | PRN

## 2012-02-10 MED ORDER — IOHEXOL 300 MG/ML  SOLN
75.0000 mL | Freq: Once | INTRAMUSCULAR | Status: AC | PRN
  Administered 2012-02-10: 75 mL via INTRAVENOUS

## 2012-02-10 NOTE — Progress Notes (Signed)
Followup of her hidradenitis. The patient has a small punctate opening just to the right of the anterior edge of her labia. This has been draining some purulent fluid. She has another area in the left mons that is quite tender but does not appear to be infected. However on examination there seems to be something dark underneath the skin. We prepped both of these areas with Betadine and anesthetized with 1% lidocaine. The anterior labial abscess was opened with a stab incision and a small amount of purulence was expressed. We opened the left mons lesion and encountered a large cluster of ingrown hairs. We removed all of these. No purulent fluid was noted. Both of these areas were dressed with dry dressings. She may followup with [place] as needed. No need for antibiotics at this time.  Wilmon Arms. Corliss Skains, MD, [facility]  02/10/2012 3:00 PM

## 2012-02-15 ENCOUNTER — Encounter (INDEPENDENT_AMBULATORY_CARE_PROVIDER_SITE_OTHER): Payer: Medicaid Other | Admitting: Surgery

## 2012-02-20 ENCOUNTER — Other Ambulatory Visit: Payer: Self-pay

## 2012-02-24 ENCOUNTER — Other Ambulatory Visit: Payer: Self-pay | Admitting: Otolaryngology

## 2012-02-27 ENCOUNTER — Emergency Department (HOSPITAL_COMMUNITY)
Admission: EM | Admit: 2012-02-27 | Discharge: 2012-02-27 | Disposition: A | Discharge status: Home / Self Care | Payer: Medicaid Other | Attending: Emergency Medicine | Admitting: Emergency Medicine

## 2012-02-27 ENCOUNTER — Encounter (HOSPITAL_COMMUNITY): Payer: Self-pay | Admitting: Nurse Practitioner

## 2012-02-27 DIAGNOSIS — Z872 Personal history of diseases of the skin and subcutaneous tissue: Secondary | ICD-10-CM | POA: Insufficient documentation

## 2012-02-27 DIAGNOSIS — M542 Cervicalgia: Secondary | ICD-10-CM | POA: Insufficient documentation

## 2012-02-27 DIAGNOSIS — Z8739 Personal history of other diseases of the musculoskeletal system and connective tissue: Secondary | ICD-10-CM | POA: Insufficient documentation

## 2012-02-27 DIAGNOSIS — G8918 Other acute postprocedural pain: Secondary | ICD-10-CM | POA: Insufficient documentation

## 2012-02-27 DIAGNOSIS — R071 Chest pain on breathing: Secondary | ICD-10-CM | POA: Insufficient documentation

## 2012-02-27 DIAGNOSIS — IMO0001 Reserved for inherently not codable concepts without codable children: Secondary | ICD-10-CM | POA: Insufficient documentation

## 2012-02-27 DIAGNOSIS — R51 Headache: Secondary | ICD-10-CM | POA: Insufficient documentation

## 2012-02-27 DIAGNOSIS — Z87448 Personal history of other diseases of urinary system: Secondary | ICD-10-CM | POA: Insufficient documentation

## 2012-02-27 DIAGNOSIS — F17200 Nicotine dependence, unspecified, uncomplicated: Secondary | ICD-10-CM | POA: Insufficient documentation

## 2012-02-27 DIAGNOSIS — K219 Gastro-esophageal reflux disease without esophagitis: Secondary | ICD-10-CM | POA: Insufficient documentation

## 2012-02-27 DIAGNOSIS — Z79899 Other long term (current) drug therapy: Secondary | ICD-10-CM | POA: Insufficient documentation

## 2012-02-27 DIAGNOSIS — Z9889 Other specified postprocedural states: Secondary | ICD-10-CM | POA: Insufficient documentation

## 2012-02-27 DIAGNOSIS — R5381 Other malaise: Secondary | ICD-10-CM | POA: Insufficient documentation

## 2012-02-27 DIAGNOSIS — Z8679 Personal history of other diseases of the circulatory system: Secondary | ICD-10-CM | POA: Insufficient documentation

## 2012-02-27 DIAGNOSIS — R748 Abnormal levels of other serum enzymes: Secondary | ICD-10-CM | POA: Insufficient documentation

## 2012-02-27 LAB — CBC WITH DIFFERENTIAL/PLATELET
Basophils Relative: 1 % (ref 0–1)
Eosinophils Absolute: 0 10*3/uL (ref 0.0–0.7)
Hemoglobin: 14.1 g/dL (ref 12.0–15.0)
Lymphs Abs: 1.7 10*3/uL (ref 0.7–4.0)
MCH: 30.3 pg (ref 26.0–34.0)
MCHC: 34.3 g/dL (ref 30.0–36.0)
Monocytes Relative: 3 % (ref 3–12)
Neutro Abs: 10.3 10*3/uL — ABNORMAL HIGH (ref 1.7–7.7)
Neutrophils Relative %: 82 % — ABNORMAL HIGH (ref 43–77)
Platelets: 330 10*3/uL (ref 150–400)
RBC: 4.66 MIL/uL (ref 3.87–5.11)

## 2012-02-27 LAB — COMPREHENSIVE METABOLIC PANEL
AST: 149 U/L — ABNORMAL HIGH (ref 0–37)
Albumin: 3.6 g/dL (ref 3.5–5.2)
Calcium: 9.5 mg/dL (ref 8.4–10.5)
Chloride: 101 mEq/L (ref 96–112)
Creatinine, Ser: 0.6 mg/dL (ref 0.50–1.10)

## 2012-02-27 LAB — RAPID URINE DRUG SCREEN, HOSP PERFORMED
Barbiturates: NOT DETECTED
Benzodiazepines: POSITIVE — AB
Cocaine: NOT DETECTED
Opiates: POSITIVE — AB

## 2012-02-27 LAB — URINALYSIS, ROUTINE W REFLEX MICROSCOPIC
Glucose, UA: NEGATIVE mg/dL
Specific Gravity, Urine: 1.018 (ref 1.005–1.030)
pH: 8 (ref 5.0–8.0)

## 2012-02-27 LAB — URINE MICROSCOPIC-ADD ON

## 2012-02-27 LAB — HCG, QUANTITATIVE, PREGNANCY: hCG, Beta Chain, Quant, S: <1 m[IU]/mL (ref <5)

## 2012-02-27 NOTE — ED Provider Notes (Signed)
Re-evaluation:  She is sleeping but easily awakened. Coherent and oriented on recheck. Ice pack to neck. There is no redness to multiple linear surgical incisions to right neck. Moderate swelling, no wound dehiscence and no active drainage or evidence of recent bleeding. She is handling her own secretions without difficulty.   Lab studies discussed with patient and necessity for recheck of liver functions later this week. As she is somnolent here, feel caution should be used and no further narcotic pain medications will be prescribed. Advised to call Dr. Emeline Darling on Monday for recheck.   Arnoldo Hooker, PA-C 02/27/12 1636

## 2012-02-27 NOTE — ED Notes (Signed)
Cold pack given to pt to place on face to help with pain

## 2012-02-27 NOTE — ED Notes (Signed)
Pt denies fever but states has had chills; pt denies nausea, states she has been given medication for that; pt states dizzy and states she has passed out twice since surgery (once this morning and once yesterday). Pt states the pain will throb really bad and she will get up and black out. Pt is alert and oriented currently; pt states right side is numb since surgery.

## 2012-02-27 NOTE — ED Provider Notes (Signed)
History     CSN: [id number]  Arrival date & time 02/27/12  1255   First MD Initiated Contact with Patient 02/27/12 1418      Chief Complaint  Patient presents with  . Wound Infection    (Consider location/radiation/quality/duration/timing/severity/associated sxs/prior treatment) HPI Comments: Patient states that her husband encouraged her to come to the ED because he had significant difficulty waking her up yesterday. She took more pain medicine today and yesterday because her pain increased. When asked how much she took she states "only as prescribed". She states the swelling has also gotten worse since her surgery on 2/19.   Patient is a 27 y.o. female presenting with wound check. The history is provided by the patient. The history is limited by the condition of the patient.  Wound Check This is a new problem. The current episode started yesterday. The problem occurs constantly. The problem has been gradually worsening. Associated symptoms include chest pain, fatigue and neck pain. Pertinent negatives include no fever. Associated symptoms comments: Pleuritic chest pain. She has tried oral narcotics for the symptoms.    Past Medical History  Diagnosis Date  . Headache     migraines  . Hidradenitis 10/2011    right groin  . GERD (gastroesophageal reflux disease)     prilosec not reordered-  not taking x approx 3 weeks  . Fibromyalgia   . Arthritis     lower back  . Hematuria     chronic  . Simple cyst of kidney     states normal kidney function, except for hematuria    Past Surgical History  Procedure Laterality Date  . Tonsillectomy      & adenoids as a child  . Ear cyst excision  2007    bil ears  . 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 Tumor removal  2010    left foot  . 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 Tumor removal  2011    left side of head  . Tubal ligation  09/05/2010    Procedure: ESSURE TUBAL STERILIZATION;  Surgeon: Roseanna Rainbow, MD;   Location: [facility];  Service: Gynecology;  Laterality: Left;  . Hysteroscopy w/ endometrial ablation  03/13/2011  . Dilation and curettage of uterus  03/13/2011  . Inguinal hidradenitis excision  09/30/2010    bilateral  . Irrigation and debridement sebaceous cyst  03/01/2009    exc. sebaceous cyst x 4 bilateral groin  . Salpingectomy  08/25/2006    laparoscopic - right  . Hydradenitis excision  10/30/2011    Procedure: EXCISION HYDRADENITIS GROIN;  Surgeon: Wilmon Arms. Corliss Skains, MD;  Location: [facility];  Service: General;  Laterality: Bilateral;    Family History  Problem Relation Age of Onset  . Cancer Maternal Aunt     breast  . Cancer Maternal Uncle     lung    History  Substance Use Topics  . Smoking status: Current Every Day Smoker -- 0.50 packs/day for 8 years    Types: Cigarettes  . Smokeless tobacco: Never Used  . Alcohol Use: No    OB History   Grav Para Term Preterm Abortions TAB SAB Ect Mult Living   1               Review of Systems  Constitutional: Positive for fatigue. Negative for fever.  HENT: Positive for neck pain.   Respiratory: Negative for shortness of breath.  Pleuritic chest pain  Cardiovascular: Positive for chest pain.    Allergies  Ibuprofen; Meloxicam; and Tramadol  Home Medications   Current Outpatient Rx  Name  Route  Sig  Dispense  Refill  . DULoxetine (CYMBALTA) 30 MG capsule   Oral   Take 30 mg by mouth daily.         Marland Kitchen omeprazole (PRILOSEC) 20 MG capsule   Oral   Take 20 mg by mouth daily.         Marland Kitchen oxyCODONE-acetaminophen (PERCOCET) 7.5-500 MG per tablet   Oral   Take 1 tablet by mouth every 6 (six) hours as needed for pain.   40 tablet   0   . pregabalin (LYRICA) 50 MG capsule   Oral   Take 1 capsule (50 mg total) by mouth 3 (three) times daily.   90 capsule   1     BP 139/85  Pulse 95  Temp(Src) 98.5 F (36.9 C) (Oral)  Resp 17  Ht 5\' 7"  (1.702 m)  Wt 191 lb (86.637 kg)  BMI 29.91 kg/m2   SpO2 95%  Physical Exam  Constitutional: She appears lethargic. She is sleeping.  HENT:  Head: Normocephalic. Head is with laceration.    Incision is clean and dry - appears to be healing well; warm to the touch. Swelling apparent  Neck: No tracheal deviation present.  Cardiovascular: Normal rate, regular rhythm and normal heart sounds.  Exam reveals no gallop and no friction rub.   No murmur heard. Pulmonary/Chest: Effort normal and breath sounds normal. No respiratory distress. She has no wheezes. She has no rales.  Neurological: She appears lethargic.    ED Course  Procedures (including critical care time)  Labs Reviewed  CBC WITH DIFFERENTIAL  URINE RAPID DRUG SCREEN (HOSP PERFORMED)  COMPREHENSIVE METABOLIC PANEL  URINALYSIS, ROUTINE W REFLEX MICROSCOPIC  HCG, QUANTITATIVE, PREGNANCY   Results for orders placed during the hospital encounter of 02/27/12  CBC WITH DIFFERENTIAL      Result Value Range   WBC 12.5 (*) 4.0 - 10.5 K/uL   RBC 4.66  3.87 - 5.11 MIL/uL   Hemoglobin 14.1  12.0 - 15.0 g/dL   HCT 63.8  75.6 - 43.3 %   MCV 88.2  78.0 - 100.0 fL   MCH 30.3  26.0 - 34.0 pg   MCHC 34.3  30.0 - 36.0 g/dL   RDW 29.5  18.8 - 41.6 %   Platelets 330  150 - 400 K/uL   Neutrophils Relative 82 (*) 43 - 77 %   Neutro Abs 10.3 (*) 1.7 - 7.7 K/uL   Lymphocytes Relative 14  12 - 46 %   Lymphs Abs 1.7  0.7 - 4.0 K/uL   Monocytes Relative 3  3 - 12 %   Monocytes Absolute 0.4  0.1 - 1.0 K/uL   Eosinophils Relative 0  0 - 5 %   Eosinophils Absolute 0.0  0.0 - 0.7 K/uL   Basophils Relative 1  0 - 1 %   Basophils Absolute 0.1  0.0 - 0.1 K/uL  URINE RAPID DRUG SCREEN (HOSP PERFORMED)      Result Value Range   Opiates POSITIVE (*) NONE DETECTED   Cocaine NONE DETECTED  NONE DETECTED   Benzodiazepines POSITIVE (*) NONE DETECTED   Amphetamines NONE DETECTED  NONE DETECTED   Tetrahydrocannabinol NONE DETECTED  NONE DETECTED   Barbiturates NONE DETECTED  NONE DETECTED    COMPREHENSIVE METABOLIC PANEL      Result  Value Range   Sodium 140  135 - 145 mEq/L   Potassium 4.3  3.5 - 5.1 mEq/L   Chloride 101  96 - 112 mEq/L   CO2 28  19 - 32 mEq/L   Glucose, Bld 130 (*) 70 - 99 mg/dL   BUN 12  6 - 23 mg/dL   Creatinine, Ser 1.61  0.50 - 1.10 mg/dL   Calcium 9.5  8.4 - 09.6 mg/dL   Total Protein 7.6  6.0 - 8.3 g/dL   Albumin 3.6  3.5 - 5.2 g/dL   AST 045 (*) 0 - 37 U/L   ALT 125 (*) 0 - 35 U/L   Alkaline Phosphatase 97  39 - 117 U/L   Total Bilirubin 0.1 (*) 0.3 - 1.2 mg/dL   GFR calc non Af Amer >90  >90 mL/min   GFR calc Af Amer >90  >90 mL/min  URINALYSIS, ROUTINE W REFLEX MICROSCOPIC      Result Value Range   Color, Urine YELLOW  YELLOW   APPearance CLOUDY (*) CLEAR   Specific Gravity, Urine 1.018  1.005 - 1.030   pH 8.0  5.0 - 8.0   Glucose, UA NEGATIVE  NEGATIVE mg/dL   Hgb urine dipstick LARGE (*) NEGATIVE   Bilirubin Urine NEGATIVE  NEGATIVE   Ketones, ur NEGATIVE  NEGATIVE mg/dL   Protein, ur NEGATIVE  NEGATIVE mg/dL   Urobilinogen, UA 1.0  0.0 - 1.0 mg/dL   Nitrite NEGATIVE  NEGATIVE   Leukocytes, UA NEGATIVE  NEGATIVE  HCG, QUANTITATIVE, PREGNANCY      Result Value Range   hCG, Beta Chain, Quant, S <1  <5 mIU/mL  URINE MICROSCOPIC-ADD ON      Result Value Range   Squamous Epithelial / LPF RARE  RARE   RBC / HPF 21-50  <3 RBC/hpf   Bacteria, UA RARE  RARE   No results found.  1415 ammonia stick was used to wake patient up from sleep.   1. Post-op pain   2. Elevated liver enzymes       MDM  Incision is clean and dry with no signs of erythema or infection. No purulent discharge. Elevated liver enzymes discussed patient's presentation and condition with Dr. Effie Shy.  Follow up with PCP this week for recheck of liver functions. Follow up with Dr. Emeline Darling for persistent post op pain. Patient given return instructions. Also discussed her somnolence as related to pain medication and suggested she try to take less pain medication less  frequently.       Mora Bellman, PA-C 02/29/12 1622

## 2012-02-27 NOTE — ED Notes (Addendum)
Pt was sleeping very heavily. Pt difficult to wake at first and had to use an ammonia pellet per instruction by Melvenia Beam, PA at bedside. Pt awoke and speaking in clear sentences.

## 2012-02-27 NOTE — ED Notes (Signed)
Pt ambulatory leaving ED; pt given d/c teaching and educated on at home pain management; pt alert and mentating appropriately upon d/c. Pt states that she is not driving home, but she got a ride from her sister; pt has no further questions upon d/c.

## 2012-02-27 NOTE — ED Notes (Signed)
Shari, PA at bedside. 

## 2012-02-27 NOTE — ED Notes (Signed)
Pt had cysts and lymph nodes removed from R side of neck 2 days ago at a surgical center and is having increasing hardness, swellign and pain at site since. Pain meds arent working

## 2012-02-28 NOTE — ED Provider Notes (Signed)
Medical screening examination/treatment/procedure(s) were performed by non-physician practitioner and as supervising physician I was immediately available for consultation/collaboration.   Flint Melter, MD 02/28/12 [phone]

## 2012-02-29 NOTE — ED Provider Notes (Signed)
Medical screening examination/treatment/procedure(s) were performed by non-physician practitioner and as supervising physician I was immediately available for consultation/collaboration.  Flint Melter, MD 02/29/12 [phone]

## 2012-03-01 ENCOUNTER — Ambulatory Visit: Payer: Self-pay | Admitting: Physical Medicine & Rehabilitation

## 2012-03-01 ENCOUNTER — Encounter: Payer: Medicaid Other | Attending: Physical Medicine & Rehabilitation

## 2012-03-01 DIAGNOSIS — M76899 Other specified enthesopathies of unspecified lower limb, excluding foot: Secondary | ICD-10-CM | POA: Insufficient documentation

## 2012-03-30 ENCOUNTER — Ambulatory Visit: Payer: Medicaid Other | Attending: Anesthesiology | Admitting: Physical Therapy

## 2012-03-30 DIAGNOSIS — M545 Low back pain, unspecified: Secondary | ICD-10-CM | POA: Insufficient documentation

## 2012-03-30 DIAGNOSIS — M25659 Stiffness of unspecified hip, not elsewhere classified: Secondary | ICD-10-CM | POA: Insufficient documentation

## 2012-03-30 DIAGNOSIS — IMO0001 Reserved for inherently not codable concepts without codable children: Secondary | ICD-10-CM | POA: Insufficient documentation

## 2012-03-30 DIAGNOSIS — M6281 Muscle weakness (generalized): Secondary | ICD-10-CM | POA: Insufficient documentation

## 2012-04-07 ENCOUNTER — Ambulatory Visit: Payer: Medicaid Other | Attending: Anesthesiology | Admitting: Physical Therapy

## 2012-04-07 DIAGNOSIS — M545 Low back pain, unspecified: Secondary | ICD-10-CM | POA: Insufficient documentation

## 2012-04-07 DIAGNOSIS — IMO0001 Reserved for inherently not codable concepts without codable children: Secondary | ICD-10-CM | POA: Insufficient documentation

## 2012-04-07 DIAGNOSIS — M6281 Muscle weakness (generalized): Secondary | ICD-10-CM | POA: Insufficient documentation

## 2012-04-07 DIAGNOSIS — M25659 Stiffness of unspecified hip, not elsewhere classified: Secondary | ICD-10-CM | POA: Insufficient documentation

## 2012-04-14 ENCOUNTER — Ambulatory Visit: Payer: Medicaid Other | Admitting: Physical Therapy

## 2012-04-21 ENCOUNTER — Ambulatory Visit: Payer: Medicaid Other | Admitting: Physical Therapy

## 2012-05-18 ENCOUNTER — Encounter: Payer: Self-pay | Admitting: Obstetrics

## 2012-06-02 ENCOUNTER — Other Ambulatory Visit: Payer: Self-pay | Admitting: Obstetrics

## 2012-06-28 ENCOUNTER — Other Ambulatory Visit: Payer: Self-pay | Admitting: *Deleted

## 2012-06-28 MED ORDER — FLUCONAZOLE 150 MG PO TABS: 150.0000 mg | ORAL_TABLET | Freq: Once | ORAL | Status: DC

## 2012-06-28 NOTE — Progress Notes (Signed)
Patient called stating that she is having vaginal itching and an abnormal discharge.  Diflucan sent to pharmacy per nursing protocol.

## 2012-08-12 ENCOUNTER — Ambulatory Visit (INDEPENDENT_AMBULATORY_CARE_PROVIDER_SITE_OTHER): Payer: Medicaid Other | Admitting: Advanced Practice Midwife

## 2012-08-12 ENCOUNTER — Encounter: Payer: Self-pay | Admitting: Advanced Practice Midwife

## 2012-08-12 VITALS — BP 146/94 | HR 90 | Temp 98.2°F | Ht 67.0 in | Wt 191.0 lb

## 2012-08-12 DIAGNOSIS — B3731 Acute candidiasis of vulva and vagina: Secondary | ICD-10-CM | POA: Insufficient documentation

## 2012-08-12 DIAGNOSIS — Z113 Encounter for screening for infections with a predominantly sexual mode of transmission: Secondary | ICD-10-CM

## 2012-08-12 DIAGNOSIS — B373 Candidiasis of vulva and vagina: Secondary | ICD-10-CM

## 2012-08-12 MED ORDER — FLUCONAZOLE 150 MG PO TABS: 150.0000 mg | ORAL_TABLET | Freq: Once | ORAL | Status: DC

## 2012-08-12 NOTE — Progress Notes (Signed)
.   Subjective:     Claire Caldwell is a 27 y.o. female here for a problem exam.  Current complaints: Patient states that she had some vaginal itching a couple of weeks ago and was given Diflucan.  Patient states that it helped for a while but her symptoms have returned.   Personal health questionnaire reviewed: yes.   Gynecologic History No LMP recorded. Patient has had an ablation. Contraception: ablation Last Pap: 12/2009. Results were: normal Last mammogram: N/A  Obstetric History OB History   Grav Para Term Preterm Abortions TAB SAB Ect Mult Living   1              # Outc Date GA Lbr Len/2nd Wgt Sex Del Anes PTL Lv   1 GRA            Comments: System Generated. Please review and update pregnancy details.       The following portions of the patient's history were reviewed and updated as appropriate: allergies, current medications, past family history, past medical history, past social history, past surgical history and problem list.  Review of Systems Genitourinary:positive for vaginal itching and irritation    Objective:  General appearance: alert Pelvic: cervix normal in appearance, external genitalia normal, no adnexal masses or tenderness, no cervical motion tenderness, rectovaginal septum normal, uterus normal size, shape, and consistency and vagina normal without discharge, moderate amount of discharge, white  Microscopic wet-mount exam shows hyphae.    .NA.    Assessment:    Healthy female exam.  Yeast infection STI Screen   Plan:    Diflucan for yeast infection, may repeat in 48 hours if still symptomatic Reviewed methods to help avoid repeat yeast infection STI screen per patient request today, labs pending    Neeva Trew CNM  20 min spent with patient greater than 80% spent in counseling and coordination of care.

## 2012-08-12 NOTE — Addendum Note (Signed)
Addended by: George Hugh on: 08/12/2012 03:14 PM   Modules accepted: Orders

## 2012-08-13 LAB — RPR

## 2012-08-29 ENCOUNTER — Encounter: Payer: Self-pay | Admitting: Obstetrics & Gynecology

## 2012-08-29 ENCOUNTER — Ambulatory Visit (INDEPENDENT_AMBULATORY_CARE_PROVIDER_SITE_OTHER): Payer: Medicaid Other | Admitting: Obstetrics & Gynecology

## 2012-08-29 ENCOUNTER — Ambulatory Visit: Payer: Self-pay | Admitting: Obstetrics & Gynecology

## 2012-08-29 VITALS — BP 129/85 | HR 81 | Temp 98.4°F | Ht 67.0 in | Wt 197.0 lb

## 2012-08-29 DIAGNOSIS — Z3202 Encounter for pregnancy test, result negative: Secondary | ICD-10-CM

## 2012-08-29 DIAGNOSIS — Z0000 Encounter for general adult medical examination without abnormal findings: Secondary | ICD-10-CM

## 2012-08-29 DIAGNOSIS — N949 Unspecified condition associated with female genital organs and menstrual cycle: Secondary | ICD-10-CM

## 2012-08-29 NOTE — Progress Notes (Signed)
.   Subjective:     Claire Caldwell is a 27 y.o. female here for a routine exam.  Current complaints - pain on left side for about one year.  She was here in office 08/12/12 for a yeast infection and still has her symptoms - vaginal itching.  (Hx of Essure and ablation).  Personal health questionnaire reviewed: no.   Gynecologic History No LMP recorded. Patient has had an ablation. Contraception: none Last Pap: 12/2009. Results were: normal Last mammogram: N/A  Obstetric History OB History  Gravida Para Term Preterm AB SAB TAB Ectopic Multiple Living  1             # Outcome Date GA Lbr Len/2nd Weight Sex Delivery Anes PTL Lv  1 GRA              Comments: System Generated. Please review and update pregnancy details.       The following portions of the patient's history were reviewed and updated as appropriate: allergies, current medications, past family history, past medical history, past social history, past surgical history and problem list.  Review of Systems Pertinent items are noted in HPI.    Objective:      General:  alert  Breast No masses, NT  Abdomen: soft, non-tender; bowel sounds normal; no masses,  no organomegaly   Vulva:  normal  Vagina: Pelvic floor muscles like violin strings  Cervix:  no lesions  Corpus: normal size, contour, position, consistency, mobility, non-tender  Adnexa:  normal adnexa      Informal U/S by me OK Assessment:    Pelvic pain--?myofascial pain  Plan:   PT referral Pelvic U/S Return in a few months

## 2012-08-30 ENCOUNTER — Encounter: Payer: Self-pay | Admitting: Obstetrics & Gynecology

## 2012-08-30 LAB — URINALYSIS, MICROSCOPIC ONLY: Bacteria, UA: NONE SEEN

## 2012-08-30 LAB — URINALYSIS, ROUTINE W REFLEX MICROSCOPIC
Glucose, UA: NEGATIVE mg/dL
Leukocytes, UA: NEGATIVE
Nitrite: NEGATIVE
Protein, ur: NEGATIVE mg/dL
pH: 5.5 (ref 5.0–8.0)

## 2012-08-30 LAB — PAP IG W/ RFLX HPV ASCU

## 2012-08-30 NOTE — Patient Instructions (Signed)
Health Maintenance, Females A healthy lifestyle and preventative care can promote health and wellness.  Maintain regular health, dental, and eye exams.  Eat a healthy diet. Foods like vegetables, fruits, whole grains, low-fat dairy products, and lean protein foods contain the nutrients you need without too many calories. Decrease your intake of foods high in solid fats, added sugars, and salt. Get information about a proper diet from your caregiver, if necessary.  Regular physical exercise is one of the most important things you can do for your health. Most adults should get at least 150 minutes of moderate-intensity exercise (any activity that increases your heart rate and causes you to sweat) each week. In addition, most adults need muscle-strengthening exercises on 2 or more days a week.   Maintain a healthy weight. The body mass index (BMI) is a screening tool to identify possible weight problems. It provides an estimate of body fat based on height and weight. Your caregiver can help determine your BMI, and can help you achieve or maintain a healthy weight. For adults 20 years and older:  A BMI below 18.5 is considered underweight.  A BMI of 18.5 to 24.9 is normal.  A BMI of 25 to 29.9 is considered overweight.  A BMI of 30 and above is considered obese.  Maintain normal blood lipids and cholesterol by exercising and minimizing your intake of saturated fat. Eat a balanced diet with plenty of fruits and vegetables. Blood tests for lipids and cholesterol should begin at age 20 and be repeated every 5 years. If your lipid or cholesterol levels are high, you are over 50, or you are a high risk for heart disease, you may need your cholesterol levels checked more frequently.Ongoing high lipid and cholesterol levels should be treated with medicines if diet and exercise are not effective.  If you smoke, find out from your caregiver how to quit. If you do not use tobacco, do not start.  If you  are pregnant, do not drink alcohol. If you are breastfeeding, be very cautious about drinking alcohol. If you are not pregnant and choose to drink alcohol, do not exceed 1 drink per day. One drink is considered to be 12 ounces (355 mL) of beer, 5 ounces (148 mL) of wine, or 1.5 ounces (44 mL) of liquor.  Avoid use of street drugs. Do not share needles with anyone. Ask for help if you need support or instructions about stopping the use of drugs.  High blood pressure causes heart disease and increases the risk of stroke. Blood pressure should be checked at least every 1 to 2 years. Ongoing high blood pressure should be treated with medicines, if weight loss and exercise are not effective.  If you are 55 to 27 years old, ask your caregiver if you should take aspirin to prevent strokes.  Diabetes screening involves taking a blood sample to check your fasting blood sugar level. This should be done once every 3 years, after age 45, if you are within normal weight and without risk factors for diabetes. Testing should be considered at a younger age or be carried out more frequently if you are overweight and have at least 1 risk factor for diabetes.  Breast cancer screening is essential preventative care for women. You should practice "breast self-awareness." This means understanding the normal appearance and feel of your breasts and may include breast self-examination. Any changes detected, no matter how small, should be reported to a caregiver. Women in their 20s and 30s should have   a clinical breast exam (CBE) by a caregiver as part of a regular health exam every 1 to 3 years. After age 40, women should have a CBE every year. Starting at age 40, women should consider having a mammogram (breast X-ray) every year. Women who have a family history of breast cancer should talk to their caregiver about genetic screening. Women at a high risk of breast cancer should talk to their caregiver about having an MRI and a  mammogram every year.  The Pap test is a screening test for cervical cancer. Women should have a Pap test starting at age 21. Between ages 21 and 29, Pap tests should be repeated every 2 years. Beginning at age 30, you should have a Pap test every 3 years as long as the past 3 Pap tests have been normal. If you had a hysterectomy for a problem that was not cancer or a condition that could lead to cancer, then you no longer need Pap tests. If you are between ages 65 and 70, and you have had normal Pap tests going back 10 years, you no longer need Pap tests. If you have had past treatment for cervical cancer or a condition that could lead to cancer, you need Pap tests and screening for cancer for at least 20 years after your treatment. If Pap tests have been discontinued, risk factors (such as a new sexual partner) need to be reassessed to determine if screening should be resumed. Some women have medical problems that increase the chance of getting cervical cancer. In these cases, your caregiver may recommend more frequent screening and Pap tests.  The human papillomavirus (HPV) test is an additional test that may be used for cervical cancer screening. The HPV test looks for the virus that can cause the cell changes on the cervix. The cells collected during the Pap test can be tested for HPV. The HPV test could be used to screen women aged 30 years and older, and should be used in women of any age who have unclear Pap test results. After the age of 30, women should have HPV testing at the same frequency as a Pap test.  Colorectal cancer can be detected and often prevented. Most routine colorectal cancer screening begins at the age of 50 and continues through age 75. However, your caregiver may recommend screening at an earlier age if you have risk factors for colon cancer. On a yearly basis, your caregiver may provide home test kits to check for hidden blood in the stool. Use of a small camera at the end of a  tube, to directly examine the colon (sigmoidoscopy or colonoscopy), can detect the earliest forms of colorectal cancer. Talk to your caregiver about this at age 50, when routine screening begins. Direct examination of the colon should be repeated every 5 to 10 years through age 75, unless early forms of pre-cancerous polyps or small growths are found.  Hepatitis C blood testing is recommended for all people born from 1945 through 1965 and any individual with known risks for hepatitis C.  Practice safe sex. Use condoms and avoid high-risk sexual practices to reduce the spread of sexually transmitted infections (STIs). Sexually active women aged 25 and younger should be checked for Chlamydia, which is a common sexually transmitted infection. Older women with new or multiple partners should also be tested for Chlamydia. Testing for other STIs is recommended if you are sexually active and at increased risk.  Osteoporosis is a disease in which the   bones lose minerals and strength with aging. This can result in serious bone fractures. The risk of osteoporosis can be identified using a bone density scan. Women ages 81 and over and women at risk for fractures or osteoporosis should discuss screening with their caregivers. Ask your caregiver whether you should be taking a calcium supplement or vitamin D to reduce the rate of osteoporosis.  Menopause can be associated with physical symptoms and risks. Hormone replacement therapy is available to decrease symptoms and risks. You should talk to your caregiver about whether hormone replacement therapy is right for you.  Use sunscreen with a sun protection factor (SPF) of 30 or greater. Apply sunscreen liberally and repeatedly throughout the day. You should seek shade when your shadow is shorter than you. Protect yourself by wearing long sleeves, pants, a wide-brimmed hat, and sunglasses year round, whenever you are outdoors.  Notify your caregiver of new moles or  changes in moles, especially if there is a change in shape or color. Also notify your caregiver if a mole is larger than the size of a pencil eraser.  Stay current with your immunizations. Document Released: 07/07/2010 Document Revised: 03/16/2011 Document Reviewed: 07/07/2010 [facility] Patient Information 2014 [place], [place]. Pelvic Pain, Female Female pelvic pain can be caused by many different things and start from a variety of places. Pelvic pain refers to pain that is located in the lower half of the abdomen and between your hips. The pain may occur over a short period of time (acute) or may be reoccurring (chronic). The cause of pelvic pain may be related to disorders affecting the female reproductive organs (gynecologic), but it may also be related to the bladder, kidney stones, an intestinal complication, or muscle or skeletal problems. Getting help right away for pelvic pain is important, especially if there has been severe, sharp, or a sudden onset of unusual pain. It is also important to get help right away because some types of pelvic pain can be life threatening.  CAUSES  Below are only some of the causes of pelvic pain. The causes of pelvic pain can be in one of several categories.   Gynecologic.  Pelvic inflammatory disease.  Sexually transmitted infection.  Ovarian cyst or a twisted ovarian ligament (ovarian torsion).  Uterine lining that grows outside the uterus (endometriosis).  Fibroids, cysts, or tumors.  Ovulation.  Pregnancy.  Pregnancy that occurs outside the uterus (ectopic pregnancy).  Miscarriage.  Labor.  Abruption of the placenta or ruptured uterus.  Infection.  Uterine infection (endometritis).  Bladder infection.  Diverticulitis.  Miscarriage related to a uterine infection (septic abortion).  Bladder.  Inflammation of the bladder (cystitis).  Kidney  stone(s).  Gastrointenstinal.  Constipation.  Diverticulitis.  Neurologic.  Trauma.  Feeling pelvic pain because of mental or emotional causes (psychosomatic).  Cancers of the bowel or pelvis. EVALUATION  Your caregiver will want to take a careful history of your concerns. This includes recent changes in your health, a careful gynecologic history of your periods (menses), and a sexual history. Obtaining your family history and medical history is also important. Your caregiver may suggest a pelvic exam. A pelvic exam will help identify the location and severity of the pain. It also helps in the evaluation of which organ system may be involved. In order to identify the cause of the pelvic pain and be properly treated, your caregiver may order tests. These tests may include:   A pregnancy test.  Pelvic ultrasonography.  An X-ray exam of the abdomen.  A urinalysis or evaluation of vaginal discharge.  Blood tests. HOME CARE INSTRUCTIONS   Only take over-the-counter or prescription medicines for pain, discomfort, or fever as directed by your caregiver.   Rest as directed by your caregiver.   Eat a balanced diet.   Drink enough fluids to make your urine clear or pale yellow, or as directed.   Avoid sexual intercourse if it causes pain.   Apply warm or cold compresses to the lower abdomen depending on which one helps the pain.   Avoid stressful situations.   Keep a journal of your pelvic pain. Write down when it started, where the pain is located, and if there are things that seem to be associated with the pain, such as food or your menstrual cycle.  Follow up with your caregiver as directed.  SEEK MEDICAL CARE IF:  Your medicine does not help your pain.  You have abnormal vaginal discharge. SEEK IMMEDIATE MEDICAL CARE IF:   You have heavy bleeding from the vagina.   Your pelvic pain increases.   You feel lightheaded or faint.   You have chills.   You  have pain with urination or blood in your urine.   You have uncontrolled diarrhea or vomiting.   You have a fever or persistent symptoms for more than 3 days.  You have a fever and your symptoms suddenly get worse.   You are being physically or sexually abused.  MAKE SURE YOU:  Understand these instructions.  Will watch your condition.  Will get help if you are not doing well or get worse. Document Released: 11/19/2003 Document Revised: 06/23/2011 Document Reviewed: 04/13/2011 [facility] Patient Information 2014 [place], [place].

## 2012-09-06 ENCOUNTER — Encounter: Payer: Self-pay | Admitting: Obstetrics & Gynecology

## 2012-09-06 ENCOUNTER — Other Ambulatory Visit: Payer: Self-pay | Admitting: Obstetrics & Gynecology

## 2012-09-06 DIAGNOSIS — E669 Obesity, unspecified: Secondary | ICD-10-CM

## 2012-09-06 DIAGNOSIS — R1032 Left lower quadrant pain: Secondary | ICD-10-CM

## 2012-09-07 ENCOUNTER — Ambulatory Visit (INDEPENDENT_AMBULATORY_CARE_PROVIDER_SITE_OTHER): Payer: Medicaid Other | Admitting: Obstetrics & Gynecology

## 2012-09-07 ENCOUNTER — Ambulatory Visit (INDEPENDENT_AMBULATORY_CARE_PROVIDER_SITE_OTHER): Payer: Medicaid Other

## 2012-09-07 ENCOUNTER — Encounter: Payer: Self-pay | Admitting: Obstetrics & Gynecology

## 2012-09-07 VITALS — BP 143/84 | HR 80 | Temp 98.5°F | Ht 67.0 in | Wt 197.8 lb

## 2012-09-07 DIAGNOSIS — N949 Unspecified condition associated with female genital organs and menstrual cycle: Secondary | ICD-10-CM

## 2012-09-07 DIAGNOSIS — E669 Obesity, unspecified: Secondary | ICD-10-CM

## 2012-09-07 DIAGNOSIS — R1032 Left lower quadrant pain: Secondary | ICD-10-CM

## 2012-09-07 NOTE — Progress Notes (Signed)
.   Subjective:     Claire Caldwell is a 27 y.o. female here for her ultrasound results.   No current complaints.  Personal health questionnaire reviewed: no.   Gynecologic History No LMP recorded. Patient has had an ablation. Contraception: none   Obstetric History OB History  Gravida Para Term Preterm AB SAB TAB Ectopic Multiple Living  5 3 3  0 2 1 0 1 0 3    # Outcome Date GA Lbr Len/2nd Weight Sex Delivery Anes PTL Lv  5 ECT           4 SAB           3 TRM         Y  2 TRM         Y  1 TRM         Y     Comments: System Generated. Please review and update pregnancy details.       The following portions of the patient's history were reviewed and updated as appropriate: allergies, current medications, past family history, past medical history, past social history, past surgical history and problem list.  Review of Systems Pertinent items are noted in HPI.    Objective:    No exam today  Assessment:   Pelvic pain--?levator spasm vs myofascial syndrome  Plan:    PT recommended   Return in a few months

## 2012-09-09 ENCOUNTER — Encounter: Payer: Self-pay | Admitting: Obstetrics & Gynecology

## 2012-09-10 ENCOUNTER — Encounter: Payer: Self-pay | Admitting: Obstetrics & Gynecology

## 2012-09-25 ENCOUNTER — Telehealth: Payer: Self-pay | Admitting: *Deleted

## 2012-09-25 MED ORDER — TERCONAZOLE 0.8 % VA CREA: TOPICAL_CREAM | VAGINAL | Status: AC

## 2012-09-25 NOTE — Telephone Encounter (Signed)
Message copied by Glendell Docker on Sun Sep 25, 2012 11:17 AM ------      Message from: Antionette Char      Created: Fri Sep 16, 2012  9:24 PM       Offer terazol x 2 weeks for yeast ------

## 2012-09-25 NOTE — Telephone Encounter (Signed)
Call placed to patient at [phone], she was advised per Dr Jean RosenthalChristell Constant instructions. Rx sent to pharmacy

## 2012-10-03 ENCOUNTER — Encounter: Payer: Self-pay | Admitting: Obstetrics & Gynecology

## 2012-10-13 ENCOUNTER — Ambulatory Visit: Payer: Medicaid Other | Admitting: Obstetrics & Gynecology

## 2012-10-13 ENCOUNTER — Ambulatory Visit (INDEPENDENT_AMBULATORY_CARE_PROVIDER_SITE_OTHER): Payer: Medicaid Other | Admitting: Obstetrics & Gynecology

## 2012-10-13 ENCOUNTER — Encounter: Payer: Self-pay | Admitting: Obstetrics & Gynecology

## 2012-10-13 VITALS — BP 133/86 | HR 89 | Temp 98.2°F | Ht 67.0 in | Wt 199.2 lb

## 2012-10-13 DIAGNOSIS — M797 Fibromyalgia: Secondary | ICD-10-CM

## 2012-10-13 DIAGNOSIS — IMO0001 Reserved for inherently not codable concepts without codable children: Secondary | ICD-10-CM

## 2012-10-13 DIAGNOSIS — N949 Unspecified condition associated with female genital organs and menstrual cycle: Secondary | ICD-10-CM

## 2012-10-13 DIAGNOSIS — Z3202 Encounter for pregnancy test, result negative: Secondary | ICD-10-CM

## 2012-10-13 LAB — POCT URINE PREGNANCY: Preg Test, Ur: NEGATIVE

## 2012-10-13 NOTE — Progress Notes (Signed)
.   Subjective:     Claire Caldwell is a 27 y.o. female here for a follow up exam.  Current complaints: Patient states that she still has her left sided pelvic pain.  She has severe cramps about two weeks out of the month.  Personal health questionnaire reviewed: no.   Gynecologic History No LMP recorded. Patient has had an ablation. Contraception: none Last Pap: 2104. Results were: normal Last mammogram: N/A.  Obstetric History OB History  Gravida Para Term Preterm AB SAB TAB Ectopic Multiple Living  5 3 3  0 2 1 0 1 0 3    # Outcome Date GA Lbr Len/2nd Weight Sex Delivery Anes PTL Lv  5 ECT           4 SAB           3 TRM         Y  2 TRM         Y  1 TRM         Y     Comments: System Generated. Please review and update pregnancy details.       The following portions of the patient's history were reviewed and updated as appropriate: allergies, current medications, past family history, past medical history, past social history, past surgical history and problem list.  Review of Systems Pertinent items are noted in HPI.    Objective:     No exam today     Assessment:    Pelvic pain--?fibromyalgia  Plan:    She elects to proceed w/a diagnostic laparoscopy.  The risks/benefits were reviewed.

## 2012-10-14 ENCOUNTER — Encounter: Payer: Self-pay | Admitting: Obstetrics & Gynecology

## 2012-10-14 DIAGNOSIS — M797 Fibromyalgia: Secondary | ICD-10-CM | POA: Insufficient documentation

## 2012-10-14 DIAGNOSIS — N949 Unspecified condition associated with female genital organs and menstrual cycle: Secondary | ICD-10-CM | POA: Insufficient documentation

## 2012-10-14 NOTE — Patient Instructions (Signed)
Diagnostic Laparoscopy Laparoscopy is a surgical procedure. It is used to diagnose and treat diseases inside the belly(abdomen). It is usually a brief, common, and relatively simple procedure. The laparoscopeis a thin, lighted, pencil-sized instrument. It is like a telescope. It is inserted into your abdomen through a small cut (incision). Your caregiver can look at the organs inside your body through this instrument. He or she can see if there is anything abnormal. Laparoscopy can be done either in a hospital or outpatient clinic. You may be given a mild sedative to help you relax before the procedure. Once in the operating room, you will be given a drug to make you sleep (general anesthesia). Laparoscopy usually lasts less than 1 hour. After the procedure, you will be monitored in a recovery area until you are stable and doing well. Once you are home, it will take 2 to 3 days to fully recover. RISKS AND COMPLICATIONS  Laparoscopy has relatively few risks. Your caregiver will discuss the risks with you before the procedure. Some problems that can occur include:  Infection.  Bleeding.  Damage to other organs.  Anesthetic side effects. PROCEDURE Once you receive anesthesia, your surgeon inflates the abdomen with a harmless gas (carbon dioxide). This makes the organs easier to see. The laparoscope is inserted into the abdomen through a small incision. This allows your surgeon to see into the abdomen. Other small instruments are also inserted into the abdomen through other small openings. Many surgeons attach a video camera to the laparoscope to enlarge the view. During a diagnostic laparoscopy, the surgeon may be looking for inflammation, infection, or cancer. Your surgeon may take tissue samples(biopsies). The samples are sent to a specialist in looking at cells and tissue samples (pathologist). The pathologist examines them under a microscope. Biopsies can help to diagnose or confirm a  disease. AFTER THE PROCEDURE   The gas is released from inside the abdomen.  The incisions are closed with stitches (sutures). Because these incisions are small (usually less than 1/2 inch), there is usually minimal discomfort after the procedure. There may be some mild discomfort in the throat. This is from the tube placed in the throat while you were sleeping. You may have some mild abdominal discomfort. There may also be discomfort from the instrument placement incisions in the abdomen.  The recovery time is shortened as long as there are no complications.  You will rest in a recovery room until stable and doing well. As long as there are no complications, you may be allowed to go home. FINDING OUT THE RESULTS OF YOUR TEST Not all test results are available during your visit. If your test results are not back during the visit, make an appointment with your caregiver to find out the results. Do not assume everything is normal if you have not heard from your caregiver or the medical facility. It is important for you to follow up on all of your test results. HOME CARE INSTRUCTIONS   Take all medicines as directed.  Only take over-the-counter or prescription medicines for pain, discomfort, or fever as directed by your caregiver.  Resume daily activities as directed.  Showers are preferred over baths.  You may resume sexual activities in 1 week or as directed.  Do not drive while taking narcotics. SEEK MEDICAL CARE IF:   There is increasing abdominal pain.  There is new pain in the shoulders (shoulder strap areas).  You feel lightheaded or faint.  You have the chills.  You or your   child has an oral temperature above 102 F (38.9 C).  There is pus-like (purulent) drainage from any of the wounds.  You are unable to pass gas or have a bowel movement.  You feel sick to your stomach (nauseous) or throw up (vomit). MAKE SURE YOU:   Understand these instructions.  Will watch  your condition.  Will get help right away if you are not doing well or get worse. Document Released: 03/30/2000 Document Revised: 03/16/2011 Document Reviewed: 12/22/2006 [employer] Patient Information 2014 [employer].  

## 2012-10-18 ENCOUNTER — Other Ambulatory Visit: Payer: Self-pay | Admitting: *Deleted

## 2012-10-18 ENCOUNTER — Encounter: Payer: Self-pay | Admitting: Obstetrics & Gynecology

## 2012-10-18 ENCOUNTER — Encounter (HOSPITAL_COMMUNITY): Payer: Self-pay

## 2012-10-20 ENCOUNTER — Encounter (HOSPITAL_COMMUNITY): Payer: Self-pay

## 2012-10-20 ENCOUNTER — Encounter (HOSPITAL_COMMUNITY)
Admission: RE | Admit: 2012-10-20 | Discharge: 2012-10-20 | Disposition: A | Discharge status: Home / Self Care | Payer: Medicaid Other | Source: Ambulatory Visit | Attending: Obstetrics & Gynecology | Admitting: Obstetrics & Gynecology

## 2012-10-20 DIAGNOSIS — Z01812 Encounter for preprocedural laboratory examination: Secondary | ICD-10-CM | POA: Insufficient documentation

## 2012-10-20 DIAGNOSIS — Z01818 Encounter for other preprocedural examination: Secondary | ICD-10-CM | POA: Insufficient documentation

## 2012-10-20 HISTORY — DX: Essential (primary) hypertension: I10

## 2012-10-20 LAB — BASIC METABOLIC PANEL
BUN: 19 mg/dL (ref 6–23)
CO2: 24 mEq/L (ref 19–32)
Calcium: 9.6 mg/dL (ref 8.4–10.5)
Creatinine, Ser: 0.51 mg/dL (ref 0.50–1.10)
GFR calc non Af Amer: >90 mL/min (ref >90)
Glucose, Bld: 160 mg/dL — ABNORMAL HIGH (ref 70–99)
Sodium: 138 mEq/L (ref 135–145)

## 2012-10-20 LAB — CBC
HCT: 40.7 % (ref 36.0–46.0)
Hemoglobin: 13.7 g/dL (ref 12.0–15.0)
MCHC: 33.7 g/dL (ref 30.0–36.0)
Platelets: 292 10*3/uL (ref 150–400)
RBC: 4.72 MIL/uL (ref 3.87–5.11)

## 2012-10-20 NOTE — Patient Instructions (Signed)
20 Claire Caldwell  10/20/2012   Your procedure is scheduled on:  10/28/12  Enter through the Main Entrance of [facility] at 845 AM.  Pick up the phone at the desk and dial [phone].   Call this number if you have problems the morning of surgery: [phone]   Remember:   Do not eat food:After Midnight.  Do not drink clear liquids: After Midnight.  Take these medicines the morning of surgery with A SIP OF WATER: blood pressure medication, Protonix, Lyrica   Do not wear jewelry, make-up or nail polish.  Do not wear lotions, powders, or perfumes. You may wear deodorant.  Do not shave 48 hours prior to surgery.  Do not bring valuables to the hospital.  [facility] is not   responsible for any belongings or valuables brought to the hospital.  Contacts, dentures or bridgework may not be worn into surgery.  Leave suitcase in the car. After surgery it may be brought to your room.  For patients admitted to the hospital, checkout time is 11:00 AM the day of              discharge.   Patients discharged the day of surgery will not be allowed to drive             home.  Name and phone number of your driver: husband [place]  Special Instructions:   Shower using CHG 2 nights before surgery and the night before surgery.  If you shower the day of surgery use CHG.  Use special wash - you have one bottle of CHG for all showers.  You should use approximately 1/3 of the bottle for each shower.   Please read over the following fact sheets that you were given:   Surgical Site Infection Prevention

## 2012-10-27 NOTE — H&P (Signed)
  Chief Complaint: 27 y.o.  who presents with pelvic pain  Details of Present Illness: There is a subacute h/o LLQ pain.  There is a long h/o dysmenorrhea.  There were no vitals taken for this visit.  Past Medical History  Diagnosis Date  . Headache(784.0)     migraines  . Hidradenitis 10/2011    right groin  . GERD (gastroesophageal reflux disease)     prilosec not reordered-  not taking x approx 3 weeks  . Arthritis     lower back  . Hematuria     chronic  . Hypertension   . Simple cyst of kidney     states normal kidney function, except for hematuria  . Fibromyalgia    History   Social History  . Marital Status: Married    Spouse Name: N/A    Number of Children: N/A  . Years of Education: N/A   Occupational History  . Not on file.   Social History Main Topics  . Smoking status: Current Every Day Smoker -- 0.50 packs/day for 8 years    Types: Cigarettes  . Smokeless tobacco: Never Used  . Alcohol Use: No  . Drug Use: No  . Sexual Activity: Not Currently   Other Topics Concern  . Not on file   Social History Narrative  . No narrative on file   Family History  Problem Relation Age of Onset  . Cancer Maternal Aunt     breast  . Endometriosis Maternal Aunt   . Cancer Maternal Uncle     lung  . Endometriosis Mother   . Endometriosis Maternal Grandmother     Pertinent items are noted in HPI.  Pre-Op Diagnosis: pelvic pain 16109   Planned Procedure: Procedure(s): LAPAROSCOPY DIAGNOSTIC;POSSIBLE OPERATIVE LAPARSCOPY/POSSIBLE PERIOTONEAL BIOPSIES/POSSIBLE  ABLATION OF ENDOMETRIAL IMPLANTS  I have reviewed the patient's history and have completed the physical exam and Claire Caldwell is acceptable for surgery.  Roseanna Rainbow, MD 10/27/2012 2:46 PM

## 2012-10-28 ENCOUNTER — Ambulatory Visit (HOSPITAL_COMMUNITY)
Admission: RE | Admit: 2012-10-28 | Discharge: 2012-10-28 | Disposition: A | Discharge status: Home / Self Care | Payer: Medicaid Other | Source: Ambulatory Visit | Attending: Obstetrics & Gynecology | Admitting: Obstetrics & Gynecology

## 2012-10-28 ENCOUNTER — Encounter (HOSPITAL_COMMUNITY)
Admission: RE | Disposition: A | Discharge status: Home / Self Care | Payer: Self-pay | Source: Ambulatory Visit | Attending: Obstetrics & Gynecology

## 2012-10-28 ENCOUNTER — Encounter (HOSPITAL_COMMUNITY): Payer: Medicaid Other | Admitting: Anesthesiology

## 2012-10-28 ENCOUNTER — Encounter (HOSPITAL_COMMUNITY): Payer: Self-pay | Admitting: *Deleted

## 2012-10-28 ENCOUNTER — Ambulatory Visit (HOSPITAL_COMMUNITY): Payer: Medicaid Other | Admitting: Anesthesiology

## 2012-10-28 DIAGNOSIS — N949 Unspecified condition associated with female genital organs and menstrual cycle: Secondary | ICD-10-CM

## 2012-10-28 DIAGNOSIS — R1032 Left lower quadrant pain: Secondary | ICD-10-CM | POA: Insufficient documentation

## 2012-10-28 DIAGNOSIS — N946 Dysmenorrhea, unspecified: Secondary | ICD-10-CM | POA: Insufficient documentation

## 2012-10-28 HISTORY — PX: LAPAROSCOPY: SHX197

## 2012-10-28 LAB — CBC
HCT: 38.7 % (ref 36.0–46.0)
MCH: 28.4 pg (ref 26.0–34.0)
MCV: 86 fL (ref 78.0–100.0)
Platelets: 270 10*3/uL (ref 150–400)
RDW: 14.1 % (ref 11.5–15.5)
WBC: 11.1 10*3/uL — ABNORMAL HIGH (ref 4.0–10.5)

## 2012-10-28 SURGERY — LAPAROSCOPY, DIAGNOSTIC
Anesthesia: General | Site: Abdomen | Wound class: Clean

## 2012-10-28 MED ORDER — ROCURONIUM BROMIDE 100 MG/10ML IV SOLN
INTRAVENOUS | Status: AC
  Filled 2012-10-28: qty 1

## 2012-10-28 MED ORDER — FENTANYL CITRATE 0.05 MG/ML IJ SOLN
INTRAMUSCULAR | Status: AC
  Filled 2012-10-28: qty 2

## 2012-10-28 MED ORDER — PROMETHAZINE HCL 25 MG/ML IJ SOLN: 6.2500 mg | INTRAMUSCULAR | Status: DC | PRN

## 2012-10-28 MED ORDER — GLYCOPYRROLATE 0.2 MG/ML IJ SOLN
INTRAMUSCULAR | Status: DC | PRN
  Administered 2012-10-28: 0.3 mg via INTRAVENOUS

## 2012-10-28 MED ORDER — ONDANSETRON HCL 4 MG/2ML IJ SOLN: 4.0000 mg | Freq: Four times a day (QID) | INTRAMUSCULAR | Status: DC | PRN

## 2012-10-28 MED ORDER — BUPIVACAINE HCL (PF) 0.25 % IJ SOLN
INTRAMUSCULAR | Status: AC
  Filled 2012-10-28: qty 30

## 2012-10-28 MED ORDER — HEPARIN SODIUM (PORCINE) 5000 UNIT/ML IJ SOLN
INTRAMUSCULAR | Status: AC
  Filled 2012-10-28: qty 1

## 2012-10-28 MED ORDER — MIDAZOLAM HCL 2 MG/2ML IJ SOLN
INTRAMUSCULAR | Status: AC
  Filled 2012-10-28: qty 2

## 2012-10-28 MED ORDER — INDIGOTINDISULFONATE SODIUM 8 MG/ML IJ SOLN
INTRAMUSCULAR | Status: AC
  Filled 2012-10-28: qty 5

## 2012-10-28 MED ORDER — GLYCOPYRROLATE 0.2 MG/ML IJ SOLN
INTRAMUSCULAR | Status: AC
  Filled 2012-10-28: qty 3

## 2012-10-28 MED ORDER — LIDOCAINE HCL (CARDIAC) 20 MG/ML IV SOLN
INTRAVENOUS | Status: DC | PRN
  Administered 2012-10-28: 30 mg via INTRAVENOUS
  Administered 2012-10-28: 70 mg via INTRAVENOUS

## 2012-10-28 MED ORDER — LACTATED RINGERS IV SOLN
INTRAVENOUS | Status: DC
  Administered 2012-10-28 (×2): via INTRAVENOUS

## 2012-10-28 MED ORDER — NEOSTIGMINE METHYLSULFATE 1 MG/ML IJ SOLN
INTRAMUSCULAR | Status: AC
  Filled 2012-10-28: qty 1

## 2012-10-28 MED ORDER — MIDAZOLAM HCL 2 MG/2ML IJ SOLN
INTRAMUSCULAR | Status: DC | PRN
  Administered 2012-10-28: 2 mg via INTRAVENOUS

## 2012-10-28 MED ORDER — ACETAMINOPHEN 650 MG RE SUPP
650.0000 mg | RECTAL | Status: DC | PRN
  Filled 2012-10-28: qty 1

## 2012-10-28 MED ORDER — NEOSTIGMINE METHYLSULFATE 1 MG/ML IJ SOLN
INTRAMUSCULAR | Status: DC | PRN
  Administered 2012-10-28: 2 mg via INTRAVENOUS

## 2012-10-28 MED ORDER — OXYCODONE HCL 5 MG PO TABS: 5.0000 mg | ORAL_TABLET | ORAL | Status: DC | PRN

## 2012-10-28 MED ORDER — 0.9 % SODIUM CHLORIDE (POUR BTL) OPTIME
TOPICAL | Status: DC | PRN
  Administered 2012-10-28: 1000 mL

## 2012-10-28 MED ORDER — MIDAZOLAM HCL 2 MG/2ML IJ SOLN
0.5000 mg | Freq: Once | INTRAMUSCULAR | Status: AC | PRN
  Administered 2012-10-28: 0.5 mg via INTRAVENOUS

## 2012-10-28 MED ORDER — SODIUM CHLORIDE 0.9 % IJ SOLN: 3.0000 mL | INTRAMUSCULAR | Status: DC | PRN

## 2012-10-28 MED ORDER — ONDANSETRON HCL 4 MG/2ML IJ SOLN
INTRAMUSCULAR | Status: DC | PRN
  Administered 2012-10-28: 4 mg via INTRAVENOUS

## 2012-10-28 MED ORDER — FENTANYL CITRATE 0.05 MG/ML IJ SOLN
25.0000 ug | INTRAMUSCULAR | Status: DC | PRN
  Administered 2012-10-28 (×3): 50 ug via INTRAVENOUS

## 2012-10-28 MED ORDER — BUPIVACAINE HCL (PF) 0.25 % IJ SOLN
INTRAMUSCULAR | Status: DC | PRN
  Administered 2012-10-28: 6 mL

## 2012-10-28 MED ORDER — ROCURONIUM BROMIDE 100 MG/10ML IV SOLN
INTRAVENOUS | Status: DC | PRN
  Administered 2012-10-28: 40 mg via INTRAVENOUS

## 2012-10-28 MED ORDER — ACETAMINOPHEN 325 MG PO TABS: 650.0000 mg | ORAL_TABLET | ORAL | Status: DC | PRN

## 2012-10-28 MED ORDER — SODIUM CHLORIDE 0.9 % IJ SOLN: 3.0000 mL | Freq: Two times a day (BID) | INTRAMUSCULAR | Status: DC

## 2012-10-28 MED ORDER — MEPERIDINE HCL 25 MG/ML IJ SOLN: 6.2500 mg | INTRAMUSCULAR | Status: DC | PRN

## 2012-10-28 MED ORDER — FENTANYL CITRATE 0.05 MG/ML IJ SOLN
INTRAMUSCULAR | Status: DC | PRN
  Administered 2012-10-28 (×4): 50 ug via INTRAVENOUS

## 2012-10-28 MED ORDER — PROPOFOL 10 MG/ML IV BOLUS
INTRAVENOUS | Status: DC | PRN
  Administered 2012-10-28: 180 mg via INTRAVENOUS

## 2012-10-28 MED ORDER — SODIUM CHLORIDE 0.9 % IV SOLN: 250.0000 mL | INTRAVENOUS | Status: DC | PRN

## 2012-10-28 SURGICAL SUPPLY — 40 items
ADH SKN CLS APL DERMABOND .7 (GAUZE/BANDAGES/DRESSINGS) ×1
APL SKNCLS STERI-STRIP NONHPOA (GAUZE/BANDAGES/DRESSINGS) ×1
BAG SPEC RTRVL LRG 6X4 10 (ENDOMECHANICALS)
BENZOIN TINCTURE PRP APPL 2/3 (GAUZE/BANDAGES/DRESSINGS) ×1 IMPLANT
CABLE HIGH FREQUENCY MONO STRZ (ELECTRODE) IMPLANT
CATH ROBINSON RED A/P 16FR (CATHETERS) ×2 IMPLANT
CHLORAPREP W/TINT 26ML (MISCELLANEOUS) ×2 IMPLANT
CLOTH BEACON ORANGE TIMEOUT ST (SAFETY) ×2 IMPLANT
DERMABOND ADVANCED (GAUZE/BANDAGES/DRESSINGS) ×1
DERMABOND ADVANCED .7 DNX12 (GAUZE/BANDAGES/DRESSINGS) IMPLANT
DRESSING TELFA 8X3 (GAUZE/BANDAGES/DRESSINGS) ×1 IMPLANT
DRSG COVADERM PLUS 2X2 (GAUZE/BANDAGES/DRESSINGS) ×1 IMPLANT
GLOVE BIO SURGEON STRL SZ 6.5 (GLOVE) ×4 IMPLANT
GLOVE BIO SURGEON STRL SZ8 (GLOVE) ×1 IMPLANT
GLOVE BIOGEL PI IND STRL 7.0 (GLOVE) IMPLANT
GLOVE BIOGEL PI INDICATOR 7.0 (GLOVE) ×1
GLOVE SURG SS PI 7.0 STRL IVOR (GLOVE) ×1 IMPLANT
GOWN PREVENTION PLUS LG XLONG (DISPOSABLE) ×4 IMPLANT
GOWN STRL REIN 2XL LVL4 (GOWN DISPOSABLE) ×1 IMPLANT
GOWN STRL REIN XL XLG (GOWN DISPOSABLE) IMPLANT
NS IRRIG 1000ML POUR BTL (IV SOLUTION) ×2 IMPLANT
PACK LAPAROSCOPY BASIN (CUSTOM PROCEDURE TRAY) ×2 IMPLANT
POUCH SPECIMEN RETRIEVAL 10MM (ENDOMECHANICALS) IMPLANT
PROTECTOR NERVE ULNAR (MISCELLANEOUS) ×3 IMPLANT
SCISSORS LAP 5X35 DISP (ENDOMECHANICALS) ×1 IMPLANT
SCRUB PCMX 4 OZ (MISCELLANEOUS) ×2 IMPLANT
SEALER TISSUE G2 CVD JAW 35 (ENDOMECHANICALS) IMPLANT
SEALER TISSUE G2 CVD JAW 45CM (ENDOMECHANICALS) IMPLANT
SET IRRIG TUBING LAPAROSCOPIC (IRRIGATION / IRRIGATOR) IMPLANT
STRIP CLOSURE SKIN 1/2X4 (GAUZE/BANDAGES/DRESSINGS) ×1 IMPLANT
SUT MNCRL AB 4-0 PS2 18 (SUTURE) ×1 IMPLANT
SUT VICRYL 0 UR6 27IN ABS (SUTURE) ×1 IMPLANT
TOWEL OR 17X24 6PK STRL BLUE (TOWEL DISPOSABLE) ×4 IMPLANT
TRAY FOLEY CATH 14FR (SET/KITS/TRAYS/PACK) IMPLANT
TROCAR XCEL NON-BLD 11X100MML (ENDOMECHANICALS) IMPLANT
TROCAR XCEL NON-BLD 5MMX100MML (ENDOMECHANICALS) ×2 IMPLANT
TROCAR XCEL OPT SLVE 5M 100M (ENDOMECHANICALS) ×1 IMPLANT
TUBING INSUFFLATION W/FILTER (TUBING) ×1 IMPLANT
WARMER LAPAROSCOPE (MISCELLANEOUS) ×1 IMPLANT
WATER STERILE IRR 1000ML POUR (IV SOLUTION) ×1 IMPLANT

## 2012-10-28 NOTE — Interval H&P Note (Signed)
History and Physical Interval Note:  10/28/2012 10:17 AM  Claire Caldwell  has presented today for surgery, with the diagnosis of pelvic pain 16109  The various methods of treatment have been discussed with the patient and family. After consideration of risks, benefits and other options for treatment, the patient has consented to  Procedure(s): LAPAROSCOPY DIAGNOSTIC;POSSIBLE OPERATIVE LAPARSCOPY/POSSIBLE PERIOTONEAL BIOPSIES/POSSIBLE  ABLATION OF ENDOMETRIAL IMPLANTS (N/A) as a surgical intervention .  The patient's history has been reviewed, patient examined, no change in status, stable for surgery.  I have reviewed the patient's chart and labs.  Questions were answered to the patient's satisfaction.     [name],Carson Meche A

## 2012-10-28 NOTE — Anesthesia Preprocedure Evaluation (Signed)
Anesthesia Evaluation  Patient identified by MRN, date of birth, ID band Patient awake    Reviewed: Allergy & Precautions, H&P , Patient's Chart, lab work & pertinent test results, reviewed documented beta blocker date and time   History of Anesthesia Complications Negative for: history of anesthetic complications  Airway Mallampati: II TM Distance: >3 FB Neck ROM: full    Dental no notable dental hx.    Pulmonary neg pulmonary ROS,  breath sounds clear to auscultation  Pulmonary exam normal       Cardiovascular Exercise Tolerance: Good hypertension, negative cardio ROS  + Valvular Problems/Murmurs Rhythm:regular Rate:Normal     Neuro/Psych  Headaches, PSYCHIATRIC DISORDERS Depression  Neuromuscular disease negative neurological ROS  negative psych ROS   GI/Hepatic negative GI ROS, Neg liver ROS, GERD-  ,  Endo/Other  negative endocrine ROS  Renal/GU Renal diseasenegative Renal ROS     Musculoskeletal  (+) Fibromyalgia -  Abdominal   Peds  Hematology negative hematology ROS (+)   Anesthesia Other Findings   Reproductive/Obstetrics negative OB ROS                           Anesthesia Physical Anesthesia Plan  ASA: II  Anesthesia Plan: General ETT   Post-op Pain Management:    Induction:   Airway Management Planned:   Additional Equipment:   Intra-op Plan:   Post-operative Plan:   Informed Consent: I have reviewed the patients History and Physical, chart, labs and discussed the procedure including the risks, benefits and alternatives for the proposed anesthesia with the patient or authorized representative who has indicated his/her understanding and acceptance.   Dental Advisory Given  Plan Discussed with: CRNA and Surgeon  Anesthesia Plan Comments:         Anesthesia Quick Evaluation

## 2012-10-28 NOTE — Progress Notes (Signed)
Discharge checklist done by Adan Sis, RN.  Charted under [facility] name.

## 2012-10-28 NOTE — Op Note (Signed)
Diagnostic Laparoscopy Procedure Note  Indications: The patient is a 27 y.o. female with pelvic pain.  Pre-operative Diagnosis: Chronic pelvic apin  Post-operative Diagnosis: Same  Surgeon: Antionette Char A   Assistants: None  Anesthesia: General endotracheal anesthesia    Procedure Details  The patient was seen in the Holding Room. The risks, benefits, complications, treatment options, and expected outcomes were discussed with the patient. The possibilities of reaction to medication, pulmonary aspiration, perforation of viscus, bleeding, recurrent infection, the need for additional procedures, failure to diagnose a condition, and creating a complication requiring transfusion or operation were discussed with the patient. The patient concurred with the proposed plan, giving informed consent. The patient was taken to the Operating Room, identified as Claire Caldwell and the procedure verified as Diagnostic Laparoscopy. A Time Out was held and the above information confirmed.  After induction of general anesthesia, the patient was placed in modified dorsal lithotomy position where she was prepped, draped, and catheterized in the normal, sterile fashion.  The cervix was visualized and an intrauterine manipulator was placed. A 5 mm umbilical incision was then performed. A 5 mm optiview was passed and pneumoperitoneum was established. A 5 mm port was placed in the left lower quadrant under direct visualization.  The findings below were noted. Biopsies were taken of the adhesions using endoshears.  Following the procedure the lower quadrant port and umbilical sheath were removed. The intra-abdominal carbon dioxide was expressed. The incision was closed with subcutaneous and subcuticular sutures of 4-0 Vicryl. The intrauterine manipulator was then removed.  Instrument, sponge, and needle counts were correct prior to abdominal closure and at the conclusion of the case.   Findings: The anterior  cul-de-sac and round ligaments were normal The uterus: serosa with white excrescences on the serosa anteriorly The adnexa: ovaries/tubes normal; filmy adhesions involving the left adnexa and posterior uterus Cul-de-sac: filmy adhesions  Estimated Blood Loss:  Minimal         Drains: none         Total IV Fluids: per Anesthesiology         Specimens: Biopsies of adhesions involving posterior cul de sac and and left adnexa              Complications:  None; patient tolerated the procedure well.         Disposition: PACU - hemodynamically stable.         Condition: stable

## 2012-10-28 NOTE — Anesthesia Postprocedure Evaluation (Signed)
  Anesthesia Post Note  Patient: Claire Caldwell  Procedure(s) Performed: Procedure(s) (LRB): LAPAROSCOPY DIAGNOSTIC; PERIOTONEAL BIOPSIES (N/A)  Anesthesia type: GA  Patient location: PACU  Post pain: Pain level controlled  Post assessment: Post-op Vital signs reviewed  Last Vitals:  Filed Vitals:   10/28/12 1145  BP: 121/65  Pulse: 73  Temp: 36.8 C  Resp: 8    Post vital signs: Reviewed  Level of consciousness: sedated  Complications: No apparent anesthesia complications

## 2012-10-28 NOTE — Transfer of Care (Signed)
Immediate Anesthesia Transfer of Care Note  Patient: Claire Caldwell  Procedure(s) Performed: Procedure(s): LAPAROSCOPY DIAGNOSTIC; PERIOTONEAL BIOPSIES (N/A)  Patient Location: PACU  Anesthesia Type:General  Level of Consciousness: awake, oriented, sedated and patient cooperative  Airway & Oxygen Therapy: Patient Spontanous Breathing and Patient connected to nasal cannula oxygen  Post-op Assessment: Report given to PACU RN and Post -op Vital signs reviewed and stable  Post vital signs: Reviewed and stable  Complications: No apparent anesthesia complications

## 2012-10-31 ENCOUNTER — Encounter (HOSPITAL_COMMUNITY): Payer: Self-pay | Admitting: Obstetrics & Gynecology

## 2012-11-10 ENCOUNTER — Other Ambulatory Visit: Payer: Self-pay

## 2012-12-08 ENCOUNTER — Ambulatory Visit: Payer: Medicaid Other | Admitting: Obstetrics & Gynecology

## 2012-12-09 ENCOUNTER — Telehealth: Payer: Self-pay | Admitting: *Deleted

## 2012-12-12 NOTE — Telephone Encounter (Signed)
Pt advised to make post op appt. Pt spoke with Thelma Barge to do so.

## 2012-12-19 ENCOUNTER — Encounter: Payer: Self-pay | Admitting: Obstetrics & Gynecology

## 2012-12-19 ENCOUNTER — Ambulatory Visit (INDEPENDENT_AMBULATORY_CARE_PROVIDER_SITE_OTHER): Payer: Medicaid Other | Admitting: Obstetrics & Gynecology

## 2012-12-19 VITALS — BP 156/103 | HR 80 | Temp 97.6°F | Ht 67.0 in | Wt 205.0 lb

## 2012-12-19 DIAGNOSIS — N760 Acute vaginitis: Secondary | ICD-10-CM

## 2012-12-19 DIAGNOSIS — N949 Unspecified condition associated with female genital organs and menstrual cycle: Secondary | ICD-10-CM

## 2012-12-19 DIAGNOSIS — Z09 Encounter for follow-up examination after completed treatment for conditions other than malignant neoplasm: Secondary | ICD-10-CM

## 2012-12-19 MED ORDER — FLUCONAZOLE 150 MG PO TABS: ORAL_TABLET | ORAL | Status: DC

## 2012-12-19 NOTE — Progress Notes (Signed)
  Subjective:     Claire Caldwell is a 27 y.o. female who presents to the clinic 6 weeks status post laparoscopy for pelvic pain. Eating a regular diet without difficulty. Bowel movements are normal. Pain is controlled without any medications. Pt states she is having some discharge, bleeding and odor from the incision at her navel.  The following portions of the patient's history were reviewed and updated as appropriate: allergies, current medications, past family history, past medical history, past social history, past surgical history and problem list.  Review of Systems Pertinent items are noted in HPI.    Objective:    BP 156/103  Pulse 80  Temp(Src) 97.6 F (36.4 C)  Ht 5\' 7"  (1.702 m)  Wt 205 lb (92.987 kg)  BMI 32.10 kg/m2 General:  alert  Abdomen: soft, bowel sounds active, non-tender  Incision:   healing well, no drainage, no erythema, no hernia, no seroma, no swelling, no dehiscence, incision well approximated    SPEC: no vaginal lesions Bimanual: uterus NT, adnexa NT Assessment:    Doing well postoperatively. Operative findings again reviewed. Pathology report discussed.    Plan:   Continue any current medications. Follow up: prn

## 2012-12-20 DIAGNOSIS — Z09 Encounter for follow-up examination after completed treatment for conditions other than malignant neoplasm: Secondary | ICD-10-CM | POA: Insufficient documentation

## 2012-12-20 LAB — WET PREP BY MOLECULAR PROBE: Candida species: NEGATIVE

## 2013-01-18 ENCOUNTER — Ambulatory Visit: Payer: Medicaid Other | Admitting: Obstetrics & Gynecology

## 2013-01-23 ENCOUNTER — Ambulatory Visit: Payer: Medicaid Other | Admitting: Obstetrics & Gynecology

## 2013-03-03 ENCOUNTER — Encounter: Payer: Self-pay | Admitting: Obstetrics & Gynecology

## 2013-03-03 IMAGING — US US ABDOMEN COMPLETE
1 series · 13 of 25 positions shown · non-contrast
Comparison: Renal ultrasound 04/11/2010

CLINICAL DATA: Abdominal pain, 39 weeks pregnant.

ABDOMINAL ULTRASOUND COMPLETE

[Series 1: us abdomen complete · 104 acquisitions, 13 frames shown]
[im 1/104]
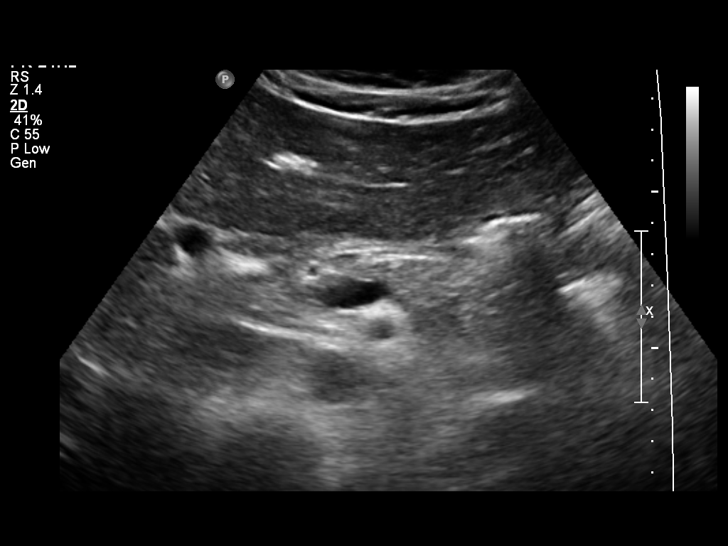
[im 9/104]
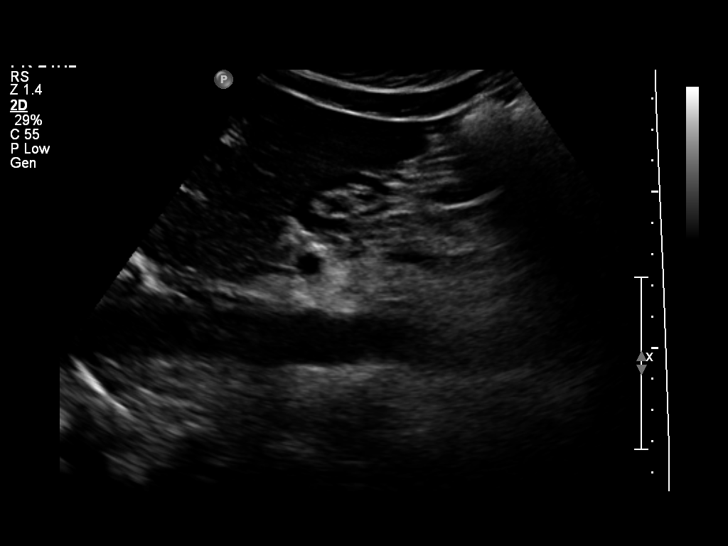
[im 18/104]
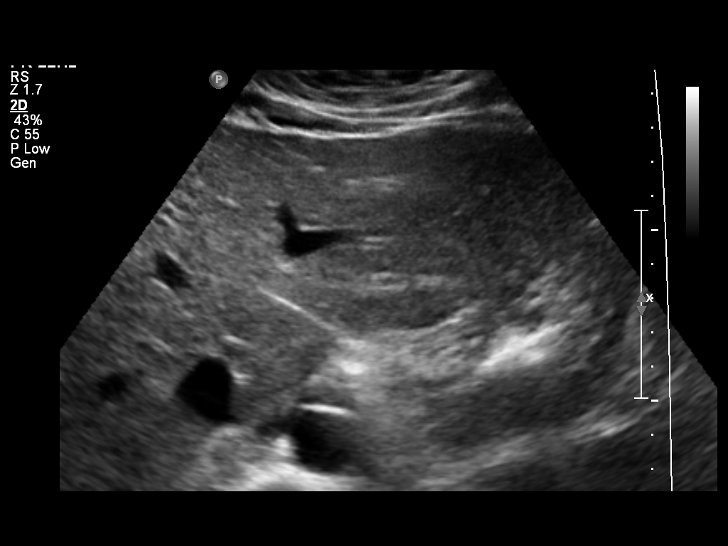
[im 26/104]
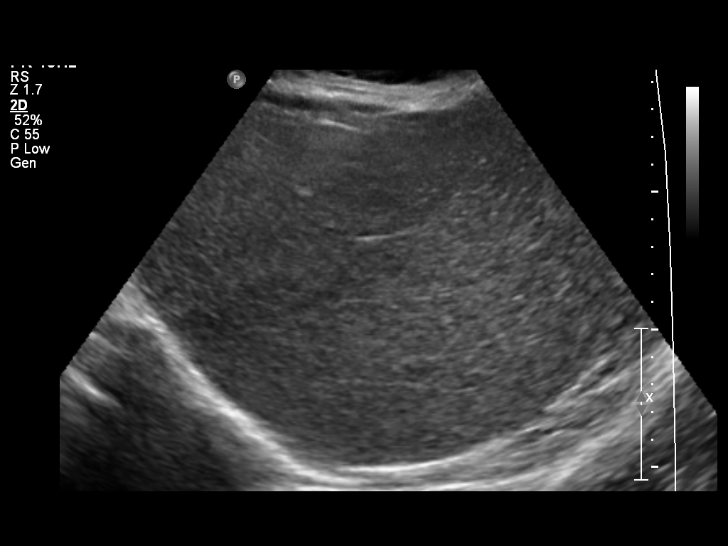
[im 35/104]
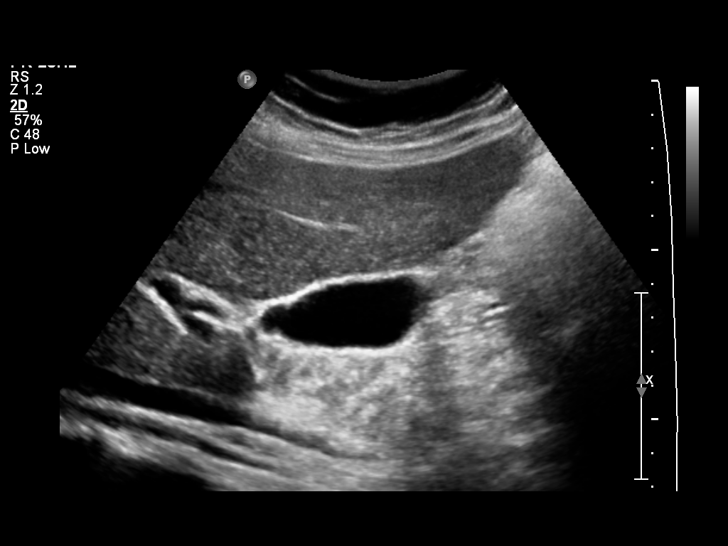
[im 43/104]
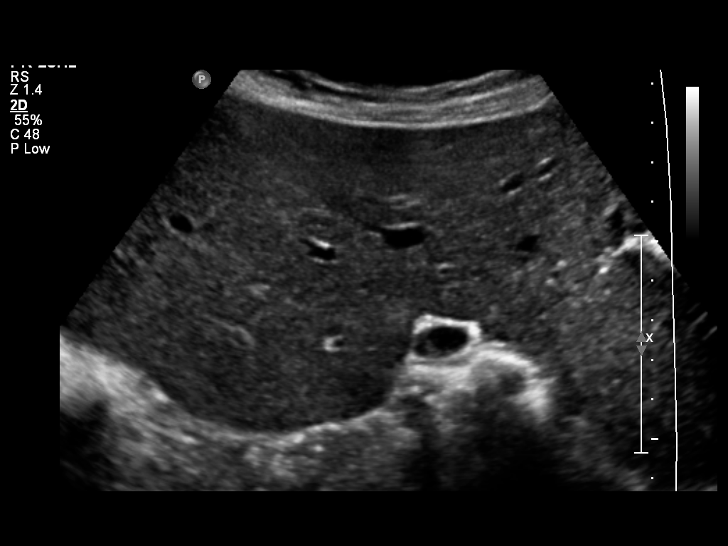
[im 52/104]
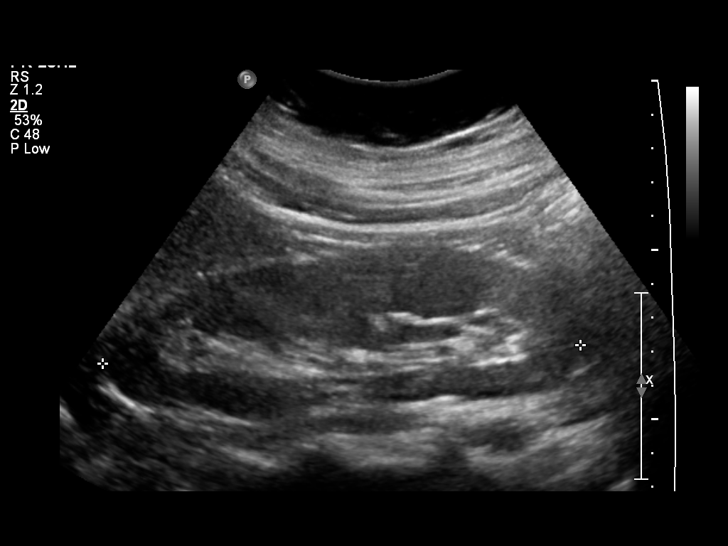
[im 61/104]
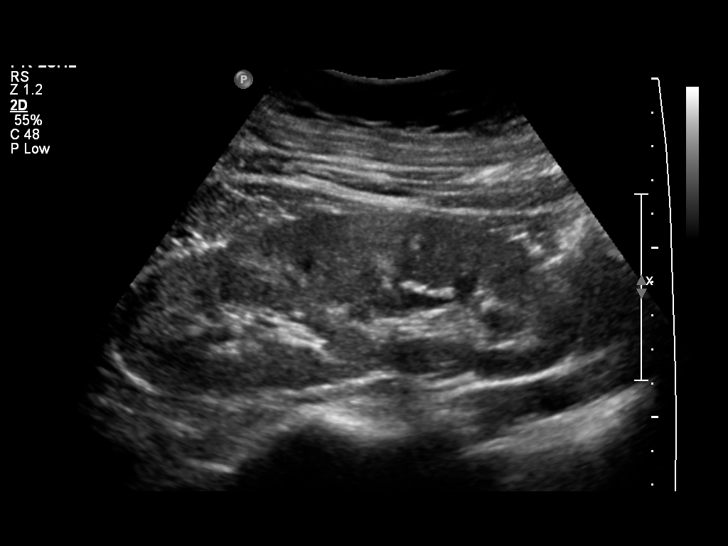
[im 69/104]
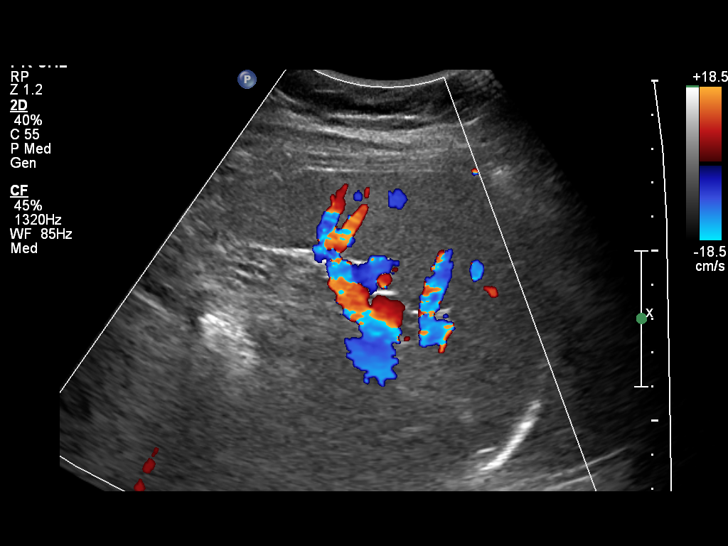
[im 78/104]
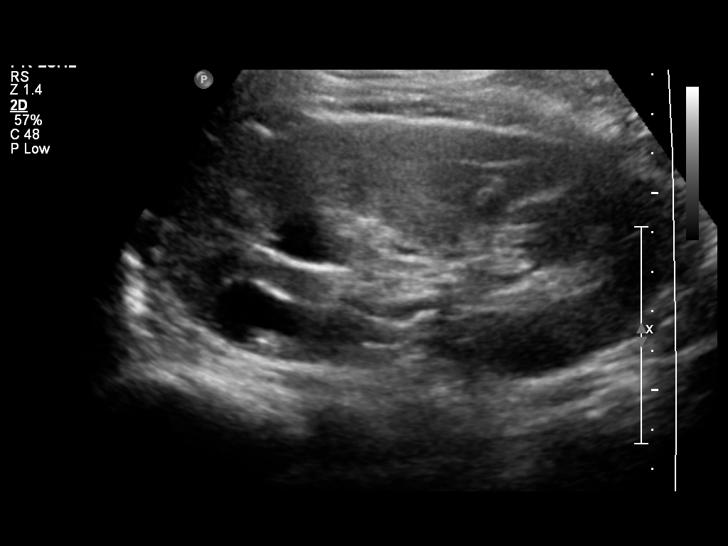
[im 86/104]
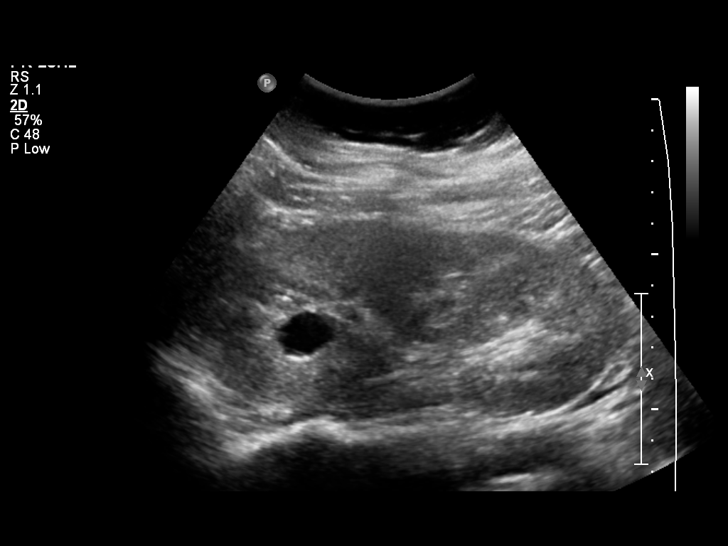
[im 95/104]
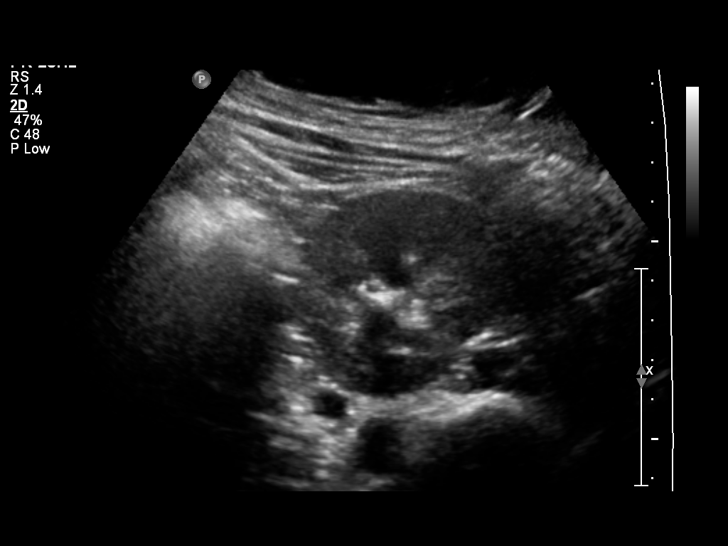
[im 104/104]
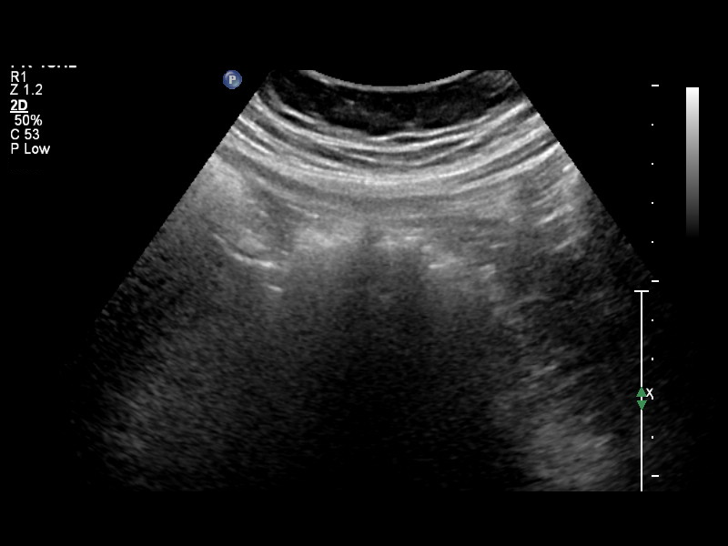

[13 of 25 positions shown; findings below may reference images not displayed]

FINDINGS: Gallbladder:  No gallstones, gallbladder wall thickening, or
pericholecystic fluid.

Common Bile Duct:  Within normal limits in caliber.

Liver: No focal mass lesion identified.  Within normal limits in
parenchymal echogenicity.

IVC:  Appears normal.

Pancreas:  No abnormality identified.

Spleen:  Within normal limits in size and echotexture.

Right kidney:  Normal in size and parenchymal echogenicity.  No
evidence of mass or hydronephrosis.

Left kidney:  Normal in size, with mild fullness of the intrarenal
collecting system.  Simple appearing left upper pole cyst is
stable.  Within a fluid filled calix in the upper pole, there is an
9 mm echogenic shadowing probable calculus,less likely milk of
calcium within a cyst.  Other smaller echogenic shadowing calculi
are noted within the lower pole.

Abdominal Aorta:  No aneurysm identified.

Imaging of the right lower quadrant does not demonstrate a dilated
tubular structure to suggest an abnormal appearing appendix or
other abnormality.
IMPRESSION: Left renal collecting system fullness, within normal limits given
the patient's gravid state, with echogenic shadowing calculi as
above.  If the patient has any left-sided pain suggesting interval
passage of a stone not seen on today's exam, options for further
evaluation include pelvic ultrasound with transvaginal imaging to
attempt to visualize a possible distal ureteral calculus, or
potentially CT depending on the acuity of the patient's symptoms
and overall clinical condition.

No other abnormality identified.  Specifically, the appendix is not
visualized.

## 2013-05-01 IMAGING — US US RENAL
1 series · 13 of 25 positions shown · non-contrast
Comparison: 06/30/2010.  04/11/2010.

CLINICAL DATA: History of urolithiasis, hematuria, proteinuria, and
hypertension.

RENAL/URINARY TRACT ULTRASOUND COMPLETE

[Series 1: us renal · 0.30mm/px · 13 of 49 slices shown]
[im 1/49]
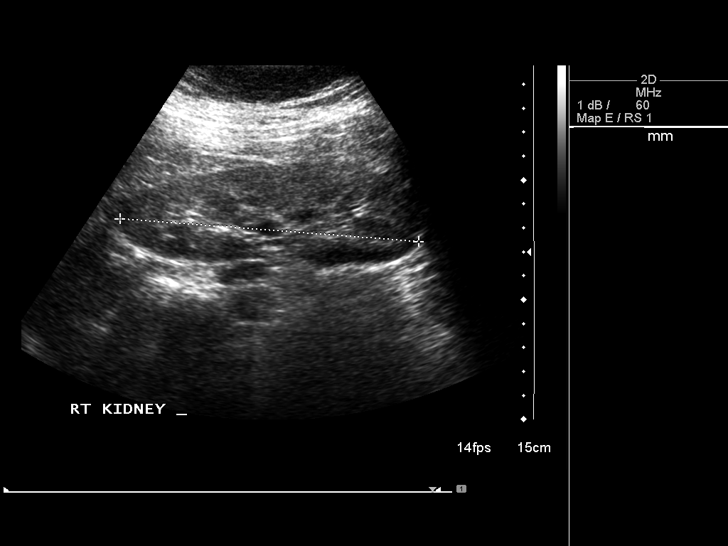
[im 5/49]
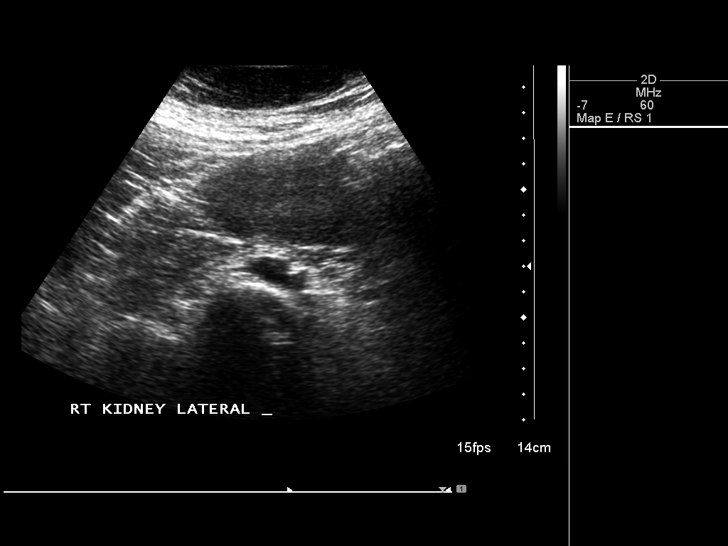
[im 9/49]
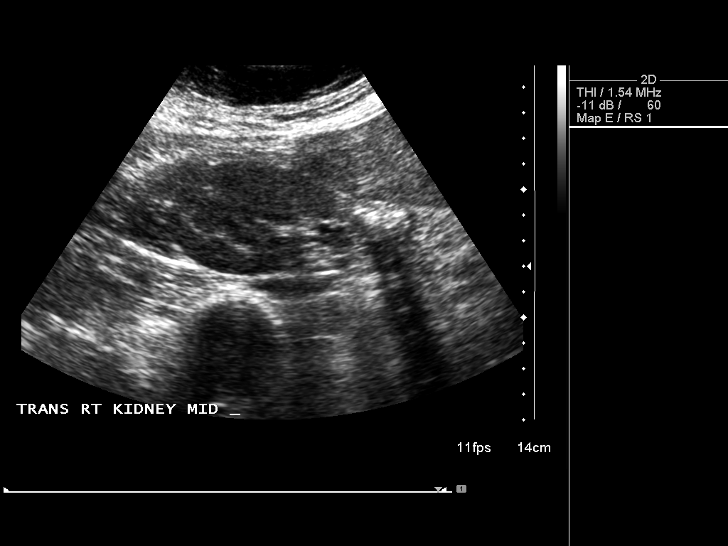
[im 13/49]
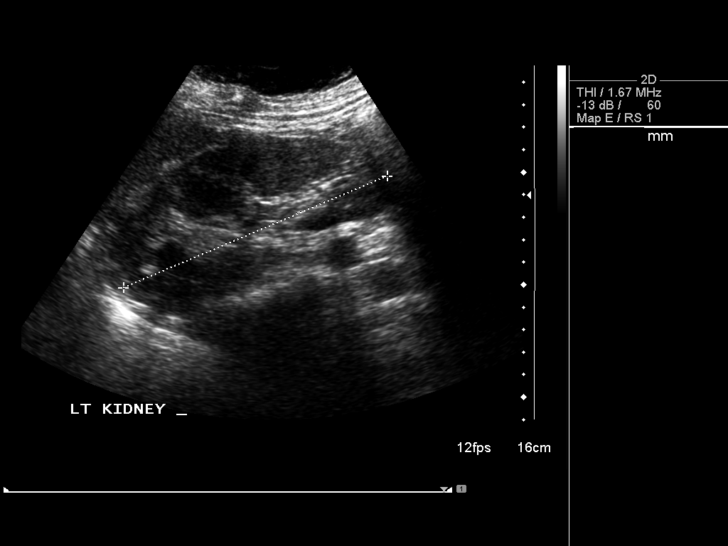
[im 17/49]
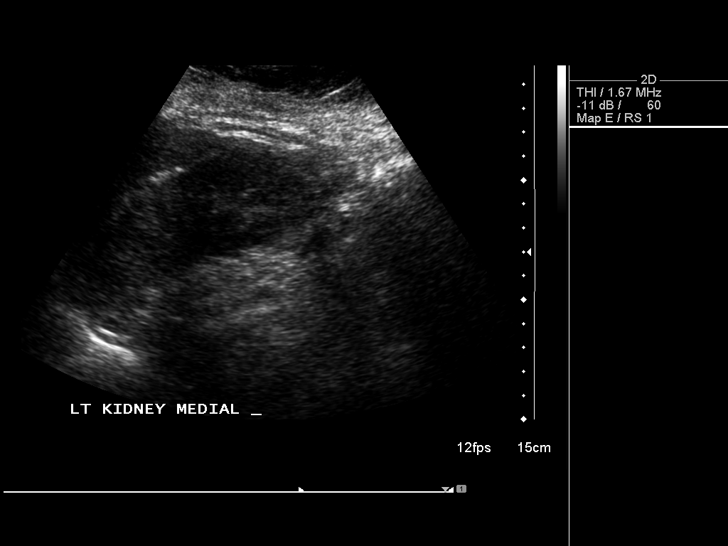
[im 21/49]
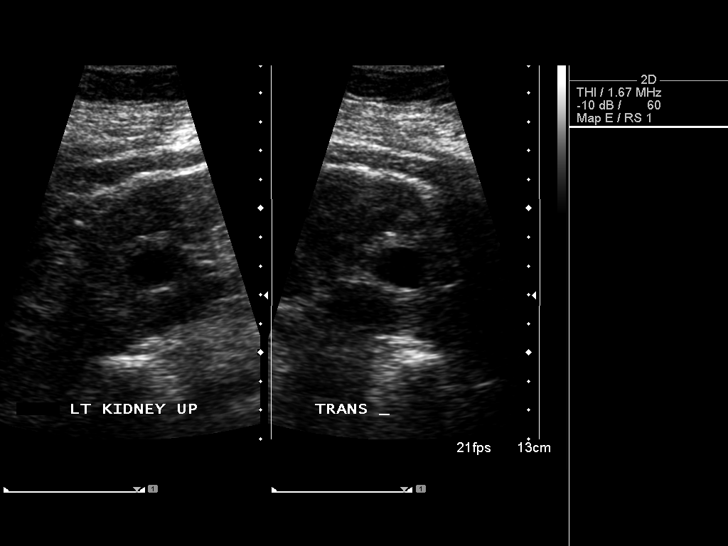
[im 25/49]
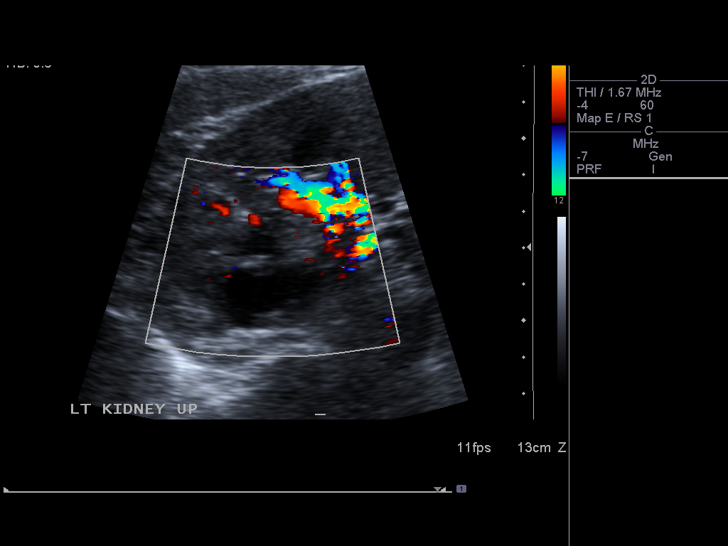
[im 29/49]
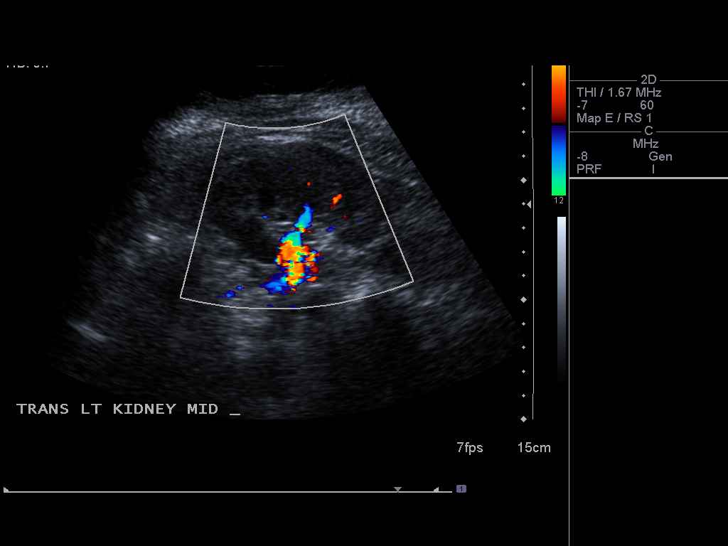
[im 33/49]
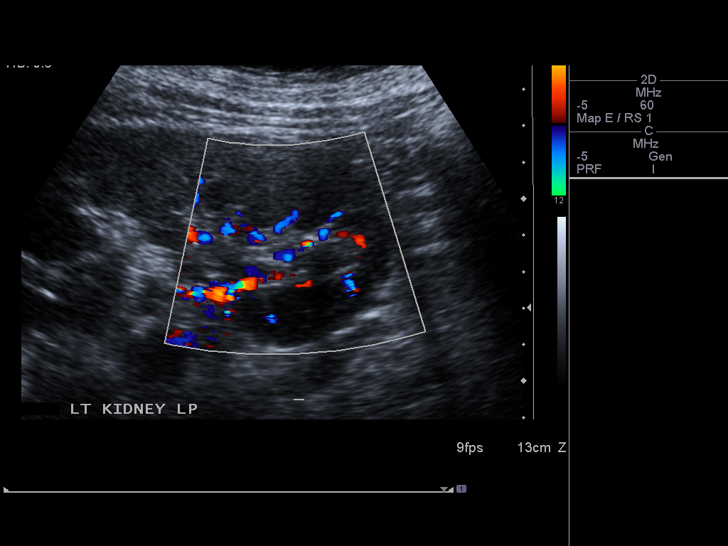
[im 37/49]
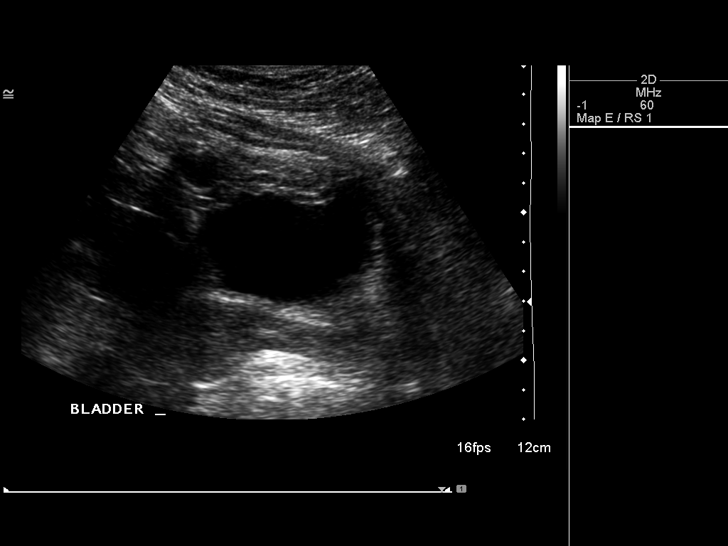
[im 41/49]
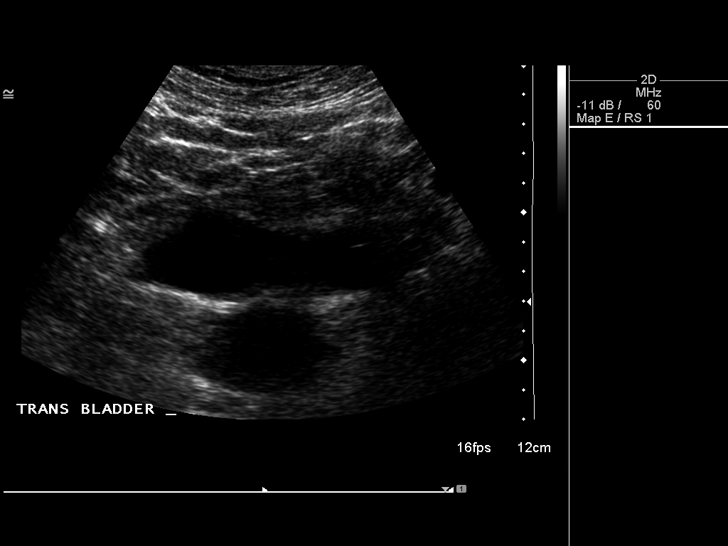
[im 45/49]
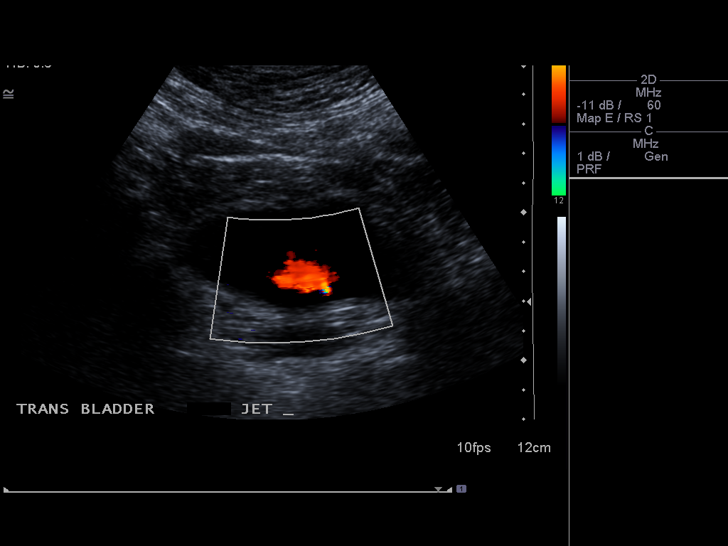
[im 49/49]
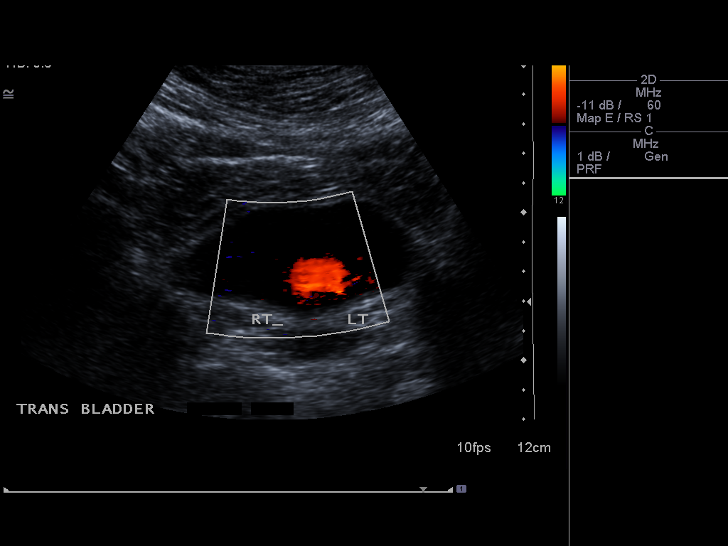

[13 of 25 positions shown; findings below may reference images not displayed]

FINDINGS: Right Kidney:  Right renal length is 12.5 cm. Examination of right
kidney shows no evidence of hydronephrosis, solid or cystic mass,
calculus, parenchymal loss, or parenchymal textural abnormality.

Left Kidney:  Left renal length is 12.7 cm. On the previous
examination a nonobstructing calculus was seen in the lower pole of
the left kidney.  It measured 7 x 6 x 6 mm.  This is seen on the
current study without significant change.  On the previous study a
cyst was seen in the upper pole of the left kidney.  On that study
the cyst measured 1.9 x 1.9 x 1.5 cm.  On the current study this is
seen and measures 1.8 x 1.6 x 1.2 cm.  It appears simple.  On the
previous study another cystic area or fluid-filled calix was
identified within the upper pole of the left kidney.  It measured
2.28 x 1.8 x 1.4 cm.  There is a calcification or calculus
associated with it unchanged.  No overall hydronephrosis seen.  No
solid mass was evident.  No parenchymal loss or parenchymal texture
abnormality is identified.

Bladder:  No urinary bladder abnormality was seen.  Bilateral
ureteral jets are present.
IMPRESSION: No urinary bladder or right renal abnormality was seen.

No left hydronephrosis or hydroureter is seen.  Stable
nonobstructing calculus in the lower pole of the left kidney.
Stable simple cyst in the upper pole of the left kidney. On the
previous study another cystic area or fluid-filled calix was
identified within the upper pole of the left kidney.  It measured
2.28 x 1.8 x 1.4 cm.  There is a calcification or calculus
associated with it unchanged.  No acute process is evident.

## 2013-11-06 ENCOUNTER — Encounter: Payer: Self-pay | Admitting: Obstetrics & Gynecology

## 2014-01-01 ENCOUNTER — Encounter: Payer: Self-pay | Admitting: *Deleted

## 2014-01-02 ENCOUNTER — Encounter: Payer: Self-pay | Admitting: Obstetrics & Gynecology

## 2019-02-21 ENCOUNTER — Other Ambulatory Visit: Payer: Self-pay

## 2019-02-21 ENCOUNTER — Encounter (HOSPITAL_COMMUNITY): Payer: Self-pay

## 2019-02-21 ENCOUNTER — Emergency Department (HOSPITAL_COMMUNITY)
Admission: EM | Admit: 2019-02-21 | Discharge: 2019-02-21 | Disposition: A | Discharge status: Home / Self Care | Payer: Medicaid Other | Attending: Emergency Medicine | Admitting: Emergency Medicine

## 2019-02-21 DIAGNOSIS — F17210 Nicotine dependence, cigarettes, uncomplicated: Secondary | ICD-10-CM | POA: Diagnosis not present

## 2019-02-21 DIAGNOSIS — L02811 Cutaneous abscess of head [any part, except face]: Secondary | ICD-10-CM | POA: Diagnosis not present

## 2019-02-21 DIAGNOSIS — Z79899 Other long term (current) drug therapy: Secondary | ICD-10-CM | POA: Insufficient documentation

## 2019-02-21 DIAGNOSIS — I10 Essential (primary) hypertension: Secondary | ICD-10-CM | POA: Diagnosis not present

## 2019-02-21 DIAGNOSIS — R220 Localized swelling, mass and lump, head: Secondary | ICD-10-CM | POA: Diagnosis present

## 2019-02-21 DIAGNOSIS — H6001 Abscess of right external ear: Secondary | ICD-10-CM

## 2019-02-21 MED ORDER — LIDOCAINE HCL 2 % IJ SOLN
10.0000 mL | Freq: Once | INTRAMUSCULAR | Status: AC
  Administered 2019-02-21: 08:00:00 200 mg
  Filled 2019-02-21: qty 20

## 2019-02-21 MED ORDER — HYDROCODONE-ACETAMINOPHEN 5-325 MG PO TABS: 1.0000 | ORAL_TABLET | Freq: Four times a day (QID) | ORAL | 0 refills | Status: DC | PRN

## 2019-02-21 MED ORDER — SULFAMETHOXAZOLE-TRIMETHOPRIM 800-160 MG PO TABS
1.0000 | ORAL_TABLET | Freq: Once | ORAL | Status: AC
  Administered 2019-02-21: 08:00:00 1 via ORAL
  Filled 2019-02-21: qty 1

## 2019-02-21 MED ORDER — SULFAMETHOXAZOLE-TRIMETHOPRIM 800-160 MG PO TABS: 1.0000 | ORAL_TABLET | Freq: Two times a day (BID) | ORAL | 0 refills | Status: AC

## 2019-02-21 NOTE — Discharge Instructions (Signed)
Take bactrim twice daily   Take tylenol or motrin for pain   Do NOT take too much excedrin   Take vicodin for severe pain   Return in 2 days for wound check. Return sooner if you have fever, severe pain, unable to hear out of the right ear

## 2019-02-21 NOTE — ED Triage Notes (Signed)
Pt reports abscess behind her right ear for the past 2 weeks, swelling now spreading to her face. Pt has hx of same with multiple surgeries. Pt denies fever/chills. Swelling and redness noted to area.

## 2019-02-21 NOTE — ED Provider Notes (Signed)
[facility] EMERGENCY DEPARTMENT Provider Note   CSN: [id number] Arrival date & time: 02/21/19  9892     History Chief Complaint  Patient presents with  . Abscess    Claire Caldwell is a 34 y.o. female hx of fibromyalgia, recurrent abscesses, HTN, here presenting with swelling behind the right ear.  Patient states that she has multiple abscesses requiring I&D and excision by surgery. She states that she noticed swelling behind the right ear about 3 days ago.  States that there is progressive swelling and pain.  Denies any fevers at home.  She states that the right side of her face became more swollen so she came here for evaluation.  Of note patient did have multiple abscesses that require incision and drainage.  Adamantly denies any IV drug use.  She states that she has an ENT doctor that removed several lymph nodes in that area previously.  She denies any dental problems and has no dental pain.  The history is provided by the patient.       Past Medical History:  Diagnosis Date  . Arthritis    lower back  . Fibromyalgia   . GERD (gastroesophageal reflux disease)    prilosec not reordered-  not taking x approx 3 weeks  . Headache(784.0)    migraines  . Hematuria    chronic  . Hidradenitis 10/2011   right groin  . Hypertension   . Simple cyst of kidney    states normal kidney function, except for hematuria    Patient Active Problem List   Diagnosis Date Noted  . Postop check 12/20/2012  . Fibromyalgia 10/14/2012  . Unspecified symptom associated with female genital organs 10/14/2012  . Hidradenitis suppurativa 01/25/2012  . Anal fissure 01/15/2012  . Disorders of sacrum 04/16/2011  . Lumbago 03/23/2011  . Trochanteric bursitis of left hip 03/23/2011  . Irregular menstrual cycle 03/13/2011  . Hidradenitis-bil groins 09/16/2010  . Sterilization 09/05/2010  . DEPRESSION 11/22/2009  . ACID REFLUX DISEASE 11/22/2009  . SYNCOPE AND COLLAPSE 11/22/2009   . DIZZINESS 11/22/2009  . PALPITATIONS 11/22/2009  . CARDIAC MURMUR 11/22/2009  . NERVOUSNESS 11/22/2009    Past Surgical History:  Procedure Laterality Date  . DILATION AND CURETTAGE OF UTERUS  03/13/2011  . EAR CYST EXCISION  2007   bil ears  . HYDRADENITIS EXCISION  10/30/2011   Procedure: EXCISION HYDRADENITIS GROIN;  Surgeon: Wilmon Arms. Corliss Skains, MD;  Location: [facility];  Service: General;  Laterality: Bilateral;  . HYSTEROSCOPY W/ ENDOMETRIAL ABLATION  03/13/2011  . INGUINAL HIDRADENITIS EXCISION  09/30/2010   bilateral  . IRRIGATION AND DEBRIDEMENT SEBACEOUS CYST  03/01/2009   exc. sebaceous cyst x 4 bilateral groin  . LAPAROSCOPY N/A 10/28/2012   Procedure: LAPAROSCOPY DIAGNOSTIC; PERIOTONEAL BIOPSIES;  Surgeon: Antionette Char, MD;  Location: [facility];  Service: Gynecology;  Laterality: N/A;  . SALPINGECTOMY  08/25/2006   laparoscopic - right  . TONSILLECTOMY     & adenoids as a child  . TUBAL LIGATION  09/05/2010   Procedure: ESSURE TUBAL STERILIZATION;  Surgeon: Roseanna Rainbow, MD;  Location: [facility];  Service: Gynecology;  Laterality: Left;  . TUMOR REMOVAL  2009   left knee  . TUMOR REMOVAL  2010   left foot  . TUMOR REMOVAL  2011   right salivary gland  . TUMOR REMOVAL  2011   left side of head     OB History    Gravida  5  Para  3   Term  3   Preterm  0   AB  2   Living  3     SAB  1   TAB  0   Ectopic  1   Multiple  0   Live Births  3           Family History  Problem Relation Age of Onset  . Endometriosis Mother   . Cancer Maternal Aunt        breast  . Endometriosis Maternal Aunt   . Cancer Maternal Uncle        lung  . Endometriosis Maternal Grandmother     Social History   Tobacco Use  . Smoking status: Current Every Day Smoker    Packs/day: 0.50    Years: 8.00    Pack years: 4.00    Types: Cigarettes  . Smokeless tobacco: Never Used  Substance Use Topics  . Alcohol use: No  . Drug use: No     Home Medications Prior to Admission medications   Medication Sig Start Date End Date Taking? Authorizing Provider  acetaminophen (TYLENOL) 500 MG tablet Take 1,500-2,000 mg by mouth 3 (three) times daily as needed for pain.    [provider]  cyclobenzaprine (FLEXERIL) 10 MG tablet Take 10 mg by mouth 2 (two) times daily as needed for muscle spasms.    [provider]  fluconazole (DIFLUCAN) 150 MG tablet Take one tablet every other day for 3 days 12/19/12   Antionette Char, MD  labetalol (NORMODYNE) 200 MG tablet Take 200 mg by mouth 2 (two) times daily.    [provider]  Oxycodone HCl 10 MG TABS Take 10 mg by mouth 3 (three) times daily.    [provider]  pantoprazole (PROTONIX) 40 MG tablet Take 40 mg by mouth daily.    [provider]  pregabalin (LYRICA) 75 MG capsule Take 75 mg by mouth 3 (three) times daily.    [provider]  zolpidem (AMBIEN) 10 MG tablet Take 10 mg by mouth at bedtime.    [provider]    Allergies    Ibuprofen, Meloxicam, and Tramadol  Review of Systems   Review of Systems  HENT:       R earlobe swelling   All other systems reviewed and are negative.   Physical Exam Updated Vital Signs BP (!) 185/114 (BP Location: Left Arm)   Pulse 83   Temp 98.7 F (37.1 C) (Oral)   Resp 14   Ht 5\' 8"  (1.727 m)   Wt 68 kg   SpO2 100%   BMI 22.81 kg/m   Physical Exam Vitals and nursing note reviewed.  Constitutional:      Appearance: Normal appearance.  HENT:     Head: Normocephalic.     Right Ear: Tympanic membrane normal.     Left Ear: Tympanic membrane normal.     Ears:     Comments: There is swelling behind the right posterior auricular area with a cyst that appears infected.  There is some purulent drainage there.  She also has some swelling and pain on the lobule.  There is no evidence of otitis media or externa.  There is no mastoid tenderness.  There is some swelling of the  posterior auricular nodes.     Mouth/Throat:     Mouth: Mucous membranes are moist.     Comments: Good dentition overall there is no evidence of periapical abscess. Eyes:  Extraocular Movements: Extraocular movements intact.     Pupils: Pupils are equal, round, and reactive to light.  Cardiovascular:     Rate and Rhythm: Normal rate.     Pulses: Normal pulses.  Pulmonary:     Effort: Pulmonary effort is normal.  Abdominal:     General: Abdomen is flat.     Palpations: Abdomen is soft.  Musculoskeletal:        General: Normal range of motion.     Cervical back: Normal range of motion.  Skin:    General: Skin is warm.     Capillary Refill: Capillary refill takes less than 2 seconds.  Neurological:     General: No focal deficit present.     Mental Status: She is alert.  Psychiatric:        Mood and Affect: Mood normal.        Behavior: Behavior normal.     ED Results / Procedures / Treatments   Labs (all labs ordered are listed, but only abnormal results are displayed) Labs Reviewed - No data to display  EKG None  Radiology No results found.  Procedures Procedures (including critical care time)  INCISION AND DRAINAGE Performed by: Wandra Arthurs Consent: Verbal consent obtained. Risks and benefits: risks, benefits and alternatives were discussed Type: abscess  Body area: R posterior auricular area  Anesthesia: local infiltration  Incision was made with a scalpel.  Local anesthetic: lidocaine 2 % no epinephrine  Anesthetic total: 5 ml  Complexity: complex Blunt dissection to break up loculations  Drainage: purulent  Drainage amount: moderate   Packing material: none   Patient tolerance: Patient tolerated the procedure well with no immediate complications.     Medications Ordered in ED Medications  lidocaine (XYLOCAINE) 2 % (with pres) injection 200 mg (has no administration in time range)  sulfamethoxazole-trimethoprim (BACTRIM DS) 800-160 MG  per tablet 1 tablet (1 tablet Oral Given 02/21/19 0809)    ED Course  I have reviewed the triage vital signs and the nursing notes.  Pertinent labs & imaging results that were available during my care of the patient were reviewed by me and considered in my medical decision making (see chart for details).    MDM Rules/Calculators/A&P                      Claire Caldwell is a 34 y.o. female here presenting with swelling behind the right ear.  She had a cyst there before to have recurrent infection.  Patient appears to have another small abscess behind the ear and there is some lymphadenopathy on the right posterior auricular nodes.  There is no evidence of mastoiditis and patient has no evidence of dental infection.  Patient is afebrile and well-appearing.  Patient does have a history of hypertension and is on meds for that.  I&D is performed in the ED and will discharge patient home on Bactrim.  Patient will return in 2 days for wound check.  Gave strict return precautions.  Final Clinical Impression(s) / ED Diagnoses Final diagnoses:  None    Rx / DC Orders ED Discharge Orders    None       Drenda Freeze, MD 02/21/19 [phone]

## 2019-03-23 ENCOUNTER — Ambulatory Visit (HOSPITAL_COMMUNITY)
Admission: EM | Admit: 2019-03-23 | Discharge: 2019-03-23 | Disposition: A | Discharge status: Home / Self Care | Payer: Medicaid Other | Attending: Urgent Care | Admitting: Urgent Care

## 2019-03-23 ENCOUNTER — Other Ambulatory Visit: Payer: Self-pay

## 2019-03-23 ENCOUNTER — Encounter (HOSPITAL_COMMUNITY): Payer: Self-pay

## 2019-03-23 DIAGNOSIS — R5381 Other malaise: Secondary | ICD-10-CM | POA: Diagnosis present

## 2019-03-23 DIAGNOSIS — Z886 Allergy status to analgesic agent status: Secondary | ICD-10-CM | POA: Diagnosis not present

## 2019-03-23 DIAGNOSIS — R030 Elevated blood-pressure reading, without diagnosis of hypertension: Secondary | ICD-10-CM

## 2019-03-23 DIAGNOSIS — R0981 Nasal congestion: Secondary | ICD-10-CM

## 2019-03-23 DIAGNOSIS — Z885 Allergy status to narcotic agent status: Secondary | ICD-10-CM | POA: Insufficient documentation

## 2019-03-23 DIAGNOSIS — Z20822 Contact with and (suspected) exposure to covid-19: Secondary | ICD-10-CM | POA: Diagnosis not present

## 2019-03-23 DIAGNOSIS — R05 Cough: Secondary | ICD-10-CM

## 2019-03-23 DIAGNOSIS — R059 Cough, unspecified: Secondary | ICD-10-CM

## 2019-03-23 DIAGNOSIS — J0110 Acute frontal sinusitis, unspecified: Secondary | ICD-10-CM | POA: Insufficient documentation

## 2019-03-23 DIAGNOSIS — F17210 Nicotine dependence, cigarettes, uncomplicated: Secondary | ICD-10-CM | POA: Diagnosis not present

## 2019-03-23 DIAGNOSIS — I10 Essential (primary) hypertension: Secondary | ICD-10-CM | POA: Insufficient documentation

## 2019-03-23 LAB — POCT RAPID STREP A: Streptococcus, Group A Screen (Direct): NEGATIVE

## 2019-03-23 MED ORDER — BENZONATATE 100 MG PO CAPS: 100.0000 mg | ORAL_CAPSULE | Freq: Three times a day (TID) | ORAL | 0 refills | Status: DC | PRN

## 2019-03-23 MED ORDER — PROMETHAZINE-DM 6.25-15 MG/5ML PO SYRP: 5.0000 mL | ORAL_SOLUTION | Freq: Every evening | ORAL | 0 refills | Status: DC | PRN

## 2019-03-23 MED ORDER — AMOXICILLIN 875 MG PO TABS: 875.0000 mg | ORAL_TABLET | Freq: Two times a day (BID) | ORAL | 0 refills | Status: DC

## 2019-03-23 MED ORDER — LOSARTAN POTASSIUM 50 MG PO TABS: 50.0000 mg | ORAL_TABLET | Freq: Every day | ORAL | 0 refills | Status: DC

## 2019-03-23 NOTE — Discharge Instructions (Signed)
For elevated blood pressure, make sure you are monitoring salt in your diet.  Do not eat restaurant foods and limit processed foods at home.  Processed foods include things like frozen meals preseason meats and dinners.  Make sure your pain attention to sodium labels on foods you by at the grocery store.  For seasoning you can use a brand called Mrs[employer] Dash which includes a lot of salt free seasonings. ° °Salads - kale, spinach, cabbage, spring mix; use seeds like pumpkin seeds or sunflower seeds, almonds; you can also use 1-2 hard boiled eggs in your salads °Fruits - avocadoes, berries (blueberries, raspberries, blackberries), apples, oranges °Vegetables - aspargus, cauliflower, broccoli, green beans, brussel spouts, bell peppers; stay away from starchy vegetables like potatoes, carrots, peas ° °Regarding meat it is better to eat lean meats and limit your red meat consumption including pork.  Wild caught fish, chicken breast are good options. ° °Do not eat any foods on this list that you are allergic to. ° ° ° °

## 2019-03-23 NOTE — ED Provider Notes (Signed)
[facility]   MRN: [id number] DOB: April 20, 1985  Subjective:   Claire Caldwell is a 34 y.o. female presenting for 10-day history of acute onset persistent moderate to severe malaise including sinus pain, sinus congestion and runny nose, throat pain.  Patient has also had headaches and persistent cough worse at bedtime.  She also had some general bloating and diarrhea 1 week ago.  [facility] and tested negative for COVID-19 through rapid test.  She was not tested for strep.  Patient has longstanding history of hypertension and is not on any medication right now.  She knows that her diet is a major problem, is unable to cook where she lives now as it is not her home.  She plans on setting up her new PCP care in May.  No current facility-administered medications for this encounter.  Current Outpatient Medications:  .  Acetaminophen-Caffeine (EXCEDRIN TENSION HEADACHE PO), Take 20-24 tablets by mouth daily., Disp: , Rfl:  .  HYDROcodone-acetaminophen (NORCO/VICODIN) 5-325 MG tablet, Take 1 tablet by mouth every 6 (six) hours as needed., Disp: 6 tablet, Rfl: 0 .  OVER THE COUNTER MEDICATION, Take 1 capsule by mouth in the morning and at bedtime. "Gabba", Disp: , Rfl:    Allergies  Allergen Reactions  . Ibuprofen Itching and Rash  . Meloxicam Itching  . Tramadol Hives and Itching    HEADACHE    Past Medical History:  Diagnosis Date  . Arthritis    lower back  . Fibromyalgia   . GERD (gastroesophageal reflux disease)    prilosec not reordered-  not taking x approx 3 weeks  . Headache(784.0)    migraines  . Hematuria    chronic  . Hidradenitis 10/2011   right groin  . Hypertension   . Simple cyst of kidney    states normal kidney function, except for hematuria     Past Surgical History:  Procedure Laterality Date  . DILATION AND CURETTAGE OF UTERUS  03/13/2011  . EAR CYST EXCISION  2007   bil ears  . HYDRADENITIS EXCISION  10/30/2011   Procedure: EXCISION  HYDRADENITIS GROIN;  Surgeon: Wilmon Arms. Corliss Skains, MD;  Location: [facility];  Service: General;  Laterality: Bilateral;  . HYSTEROSCOPY W/ ENDOMETRIAL ABLATION  03/13/2011  . INGUINAL HIDRADENITIS EXCISION  09/30/2010   bilateral  . IRRIGATION AND DEBRIDEMENT SEBACEOUS CYST  03/01/2009   exc. sebaceous cyst x 4 bilateral groin  . LAPAROSCOPY N/A 10/28/2012   Procedure: LAPAROSCOPY DIAGNOSTIC; PERIOTONEAL BIOPSIES;  Surgeon: Antionette Char, MD;  Location: [facility];  Service: Gynecology;  Laterality: N/A;  . SALPINGECTOMY  08/25/2006   laparoscopic - right  . TONSILLECTOMY     & adenoids as a child  . TUBAL LIGATION  09/05/2010   Procedure: ESSURE TUBAL STERILIZATION;  Surgeon: Roseanna Rainbow, MD;  Location: [facility];  Service: Gynecology;  Laterality: Left;  . TUMOR REMOVAL  2009   left knee  . TUMOR REMOVAL  2010   left foot  . TUMOR REMOVAL  2011   right salivary gland  . TUMOR REMOVAL  2011   left side of head    Family History  Problem Relation Age of Onset  . Endometriosis Mother   . Cancer Maternal Aunt        breast  . Endometriosis Maternal Aunt   . Cancer Maternal Uncle        lung  . Endometriosis Maternal Grandmother     Social History  Tobacco Use  . Smoking status: Current Every Day Smoker    Packs/day: 0.50    Years: 8.00    Pack years: 4.00    Types: Cigarettes  . Smokeless tobacco: Never Used  Substance Use Topics  . Alcohol use: No  . Drug use: No    Review of Systems  Constitutional: Positive for malaise/fatigue. Negative for fever.  HENT: Positive for congestion, sinus pain and sore throat. Negative for ear pain.   Eyes: Negative for blurred vision, double vision, discharge and redness.  Respiratory: Positive for cough. Negative for hemoptysis, shortness of breath and wheezing.   Cardiovascular: Negative for chest pain.  Gastrointestinal: Positive for diarrhea. Negative for abdominal pain, nausea and vomiting.  Genitourinary:  Negative for dysuria, flank pain and hematuria.  Musculoskeletal: Negative for myalgias.  Skin: Negative for rash.  Neurological: Positive for headaches. Negative for dizziness, tingling, tremors, sensory change, speech change, focal weakness and weakness.  Psychiatric/Behavioral: Negative for depression and substance abuse.     Objective:   Vitals: BP (!) 166/99 (BP Location: Left Arm)   Pulse 71   Temp 98.5 F (36.9 C) (Oral)   Resp 18   LMP  (LMP Unknown) Comment: ablation  SpO2 99%   BP Readings from Last 3 Encounters:  03/23/19 (!) 166/99  02/21/19 (!) 170/106  12/19/12 (!) 156/103   Physical Exam Constitutional:      General: She is not in acute distress.    Appearance: Normal appearance. She is well-developed. She is ill-appearing. She is not toxic-appearing or diaphoretic.  HENT:     Head: Normocephalic and atraumatic.     Right Ear: Tympanic membrane and ear canal normal. No drainage or tenderness. No middle ear effusion. Tympanic membrane is not erythematous.     Left Ear: Tympanic membrane and ear canal normal. No drainage or tenderness.  No middle ear effusion. Tympanic membrane is not erythematous.     Nose: Congestion present. No rhinorrhea.     Comments: Nasal turbinates boggy and edematous.  Frontal sinus tenderness.    Mouth/Throat:     Mouth: Mucous membranes are moist. No oral lesions.     Pharynx: No pharyngeal swelling, oropharyngeal exudate, posterior oropharyngeal erythema or uvula swelling.     Tonsils: No tonsillar exudate or tonsillar abscesses.     Comments: Significant postnasal drainage in oropharynx. Eyes:     General: No scleral icterus.       Right eye: No discharge.        Left eye: No discharge.     Extraocular Movements: Extraocular movements intact.     Right eye: Normal extraocular motion.     Left eye: Normal extraocular motion.     Conjunctiva/sclera: Conjunctivae normal.     Pupils: Pupils are equal, round, and reactive to light.    Cardiovascular:     Rate and Rhythm: Normal rate and regular rhythm.     Pulses: Normal pulses.     Heart sounds: Normal heart sounds. No murmur. No friction rub. No gallop.   Pulmonary:     Effort: Pulmonary effort is normal. No respiratory distress.     Breath sounds: Normal breath sounds. No stridor. No wheezing, rhonchi or rales.  Musculoskeletal:     Cervical back: Normal range of motion and neck supple.     Right lower leg: No edema.     Left lower leg: No edema.  Lymphadenopathy:     Cervical: No cervical adenopathy.  Skin:    General: Skin  is warm and dry.     Findings: No rash.  Neurological:     General: No focal deficit present.     Mental Status: She is alert and oriented to person, place, and time.     Cranial Nerves: No cranial nerve deficit.     Motor: No weakness.     Coordination: Coordination normal.     Gait: Gait normal.     Deep Tendon Reflexes: Reflexes normal.  Psychiatric:        Mood and Affect: Mood normal.        Behavior: Behavior normal.        Thought Content: Thought content normal.        Judgment: Judgment normal.     Results for orders placed or performed during the hospital encounter of 03/23/19 (from the past 24 hour(s))  POCT rapid strep A [facility])     Status: None   Collection Time: 03/23/19 12:04 PM  Result Value Ref Range   Streptococcus, Group A Screen (Direct) NEGATIVE NEGATIVE    Assessment and Plan :   1. Essential hypertension   2. Elevated blood pressure reading   3. Acute non-recurrent frontal sinusitis   4. Nasal congestion   5. Cough     Start amoxicillin to cover for frontal sinusitis.  Recommended general supportive care otherwise.  COVID-19 testing pending.  Counseled patient on general management of her blood pressure, I will have her start losartan to bridge her appointment with her new PCP in May.  At this time patient does not have any signs of acute stroke, intracranial process. Counseled patient on  potential for adverse effects with medications prescribed/recommended today, strict ER and return-to-clinic precautions discussed, patient verbalized understanding.    Jaynee Eagles, PA-C 03/23/19 1215

## 2019-03-23 NOTE — ED Triage Notes (Signed)
Pt c/o cough, nasal congestion, sore throat, HA onset approx 10 days ago (last Tuesday). Pt went to [facility] last Thursday and had negative rapid covid test completed. Pt states onset of diarrhea last Thursday night. C/o HA, abdom bloating and pressure, general weakness. States sore throat has improved. Pt reports she was not tested for strep.  Denies dysuria sx. Pt reports she has ongoing high BP readings and has appt with a PMD on May 28 for HTN eval.

## 2019-03-24 LAB — SARS CORONAVIRUS 2 (TAT 6-24 HRS): SARS Coronavirus 2: NEGATIVE

## 2019-03-25 LAB — CULTURE, GROUP A STREP (THRC)

## 2019-08-22 ENCOUNTER — Ambulatory Visit (HOSPITAL_COMMUNITY)
Admission: EM | Admit: 2019-08-22 | Discharge: 2019-08-22 | Disposition: A | Discharge status: Home / Self Care | Payer: Medicaid Other | Attending: Urgent Care | Admitting: Urgent Care

## 2019-08-22 ENCOUNTER — Other Ambulatory Visit: Payer: Self-pay

## 2019-08-22 ENCOUNTER — Ambulatory Visit (INDEPENDENT_AMBULATORY_CARE_PROVIDER_SITE_OTHER): Payer: Medicaid Other

## 2019-08-22 ENCOUNTER — Encounter (HOSPITAL_COMMUNITY): Payer: Self-pay | Admitting: Emergency Medicine

## 2019-08-22 DIAGNOSIS — Z79899 Other long term (current) drug therapy: Secondary | ICD-10-CM | POA: Diagnosis not present

## 2019-08-22 DIAGNOSIS — R0789 Other chest pain: Secondary | ICD-10-CM | POA: Diagnosis not present

## 2019-08-22 DIAGNOSIS — M1990 Unspecified osteoarthritis, unspecified site: Secondary | ICD-10-CM | POA: Diagnosis not present

## 2019-08-22 DIAGNOSIS — Z792 Long term (current) use of antibiotics: Secondary | ICD-10-CM | POA: Diagnosis not present

## 2019-08-22 DIAGNOSIS — R0602 Shortness of breath: Secondary | ICD-10-CM | POA: Diagnosis not present

## 2019-08-22 DIAGNOSIS — I10 Essential (primary) hypertension: Secondary | ICD-10-CM | POA: Diagnosis not present

## 2019-08-22 DIAGNOSIS — R05 Cough: Secondary | ICD-10-CM

## 2019-08-22 DIAGNOSIS — M797 Fibromyalgia: Secondary | ICD-10-CM | POA: Insufficient documentation

## 2019-08-22 DIAGNOSIS — R197 Diarrhea, unspecified: Secondary | ICD-10-CM | POA: Diagnosis present

## 2019-08-22 DIAGNOSIS — Z888 Allergy status to other drugs, medicaments and biological substances status: Secondary | ICD-10-CM | POA: Diagnosis not present

## 2019-08-22 DIAGNOSIS — Z886 Allergy status to analgesic agent status: Secondary | ICD-10-CM | POA: Insufficient documentation

## 2019-08-22 DIAGNOSIS — Z885 Allergy status to narcotic agent status: Secondary | ICD-10-CM | POA: Diagnosis not present

## 2019-08-22 DIAGNOSIS — Z20822 Contact with and (suspected) exposure to covid-19: Secondary | ICD-10-CM | POA: Insufficient documentation

## 2019-08-22 DIAGNOSIS — R519 Headache, unspecified: Secondary | ICD-10-CM | POA: Insufficient documentation

## 2019-08-22 DIAGNOSIS — F17210 Nicotine dependence, cigarettes, uncomplicated: Secondary | ICD-10-CM | POA: Diagnosis not present

## 2019-08-22 DIAGNOSIS — R52 Pain, unspecified: Secondary | ICD-10-CM

## 2019-08-22 DIAGNOSIS — J069 Acute upper respiratory infection, unspecified: Secondary | ICD-10-CM | POA: Diagnosis not present

## 2019-08-22 DIAGNOSIS — Z9079 Acquired absence of other genital organ(s): Secondary | ICD-10-CM | POA: Diagnosis not present

## 2019-08-22 DIAGNOSIS — F17200 Nicotine dependence, unspecified, uncomplicated: Secondary | ICD-10-CM

## 2019-08-22 DIAGNOSIS — R030 Elevated blood-pressure reading, without diagnosis of hypertension: Secondary | ICD-10-CM

## 2019-08-22 LAB — SARS CORONAVIRUS 2 (TAT 6-24 HRS): SARS Coronavirus 2: NEGATIVE

## 2019-08-22 MED ORDER — LOPERAMIDE HCL 2 MG PO CAPS: 2.0000 mg | ORAL_CAPSULE | Freq: Every day | ORAL | 0 refills | Status: DC | PRN

## 2019-08-22 MED ORDER — BENZONATATE 100 MG PO CAPS: 100.0000 mg | ORAL_CAPSULE | Freq: Three times a day (TID) | ORAL | 0 refills | Status: DC | PRN

## 2019-08-22 MED ORDER — CETIRIZINE HCL 10 MG PO TABS: 10.0000 mg | ORAL_TABLET | Freq: Every day | ORAL | 0 refills | Status: DC

## 2019-08-22 MED ORDER — AMLODIPINE BESYLATE 5 MG PO TABS: 5.0000 mg | ORAL_TABLET | Freq: Every day | ORAL | 0 refills | Status: DC

## 2019-08-22 MED ORDER — PROMETHAZINE-DM 6.25-15 MG/5ML PO SYRP: 5.0000 mL | ORAL_SOLUTION | Freq: Every evening | ORAL | 0 refills | Status: DC | PRN

## 2019-08-22 NOTE — ED Provider Notes (Signed)
[facility]   MRN: [id number] DOB: 01-Sep-1985  Subjective:   Claire Caldwell is a 34 y.o. female presenting for 2 week hx of diarrhea (~4-5 episodes). Started having intermittently productive cough 4 days ago, feels chest congestion, shob, body aches, headaches, chest pain. Has had her COVID vaccination, completed early May 2021. Had Moderna.  Patient smokes 1/2ppd. No asthma, COPD. Had to use inhaler as a child. Has not had changes in taste and smell.   No current facility-administered medications for this encounter.  Current Outpatient Medications:    amoxicillin (AMOXIL) 875 MG tablet, Take 1 tablet (875 mg total) by mouth 2 (two) times daily., Disp: 14 tablet, Rfl: 0   benzonatate (TESSALON) 100 MG capsule, Take 1-2 capsules (100-200 mg total) by mouth 3 (three) times daily as needed., Disp: 60 capsule, Rfl: 0   labetalol (NORMODYNE) 300 MG tablet, Take 300 mg by mouth 2 (two) times daily., Disp: , Rfl:    losartan (COZAAR) 50 MG tablet, Take 1 tablet (50 mg total) by mouth daily., Disp: 90 tablet, Rfl: 0   OVER THE COUNTER MEDICATION, Take 1 capsule by mouth in the morning and at bedtime. "Gabba", Disp: , Rfl:    promethazine-dextromethorphan (PROMETHAZINE-DM) 6.25-15 MG/5ML syrup, Take 5 mLs by mouth at bedtime as needed for cough., Disp: 100 mL, Rfl: 0   sertraline (ZOLOFT) 100 MG tablet, SMARTSIG:1 Tablet(s) By Mouth Every Evening, Disp: , Rfl:    zolpidem (AMBIEN) 10 MG tablet, Take 10 mg by mouth at bedtime as needed., Disp: , Rfl:    Allergies  Allergen Reactions   Ibuprofen Itching and Rash   Meloxicam Itching   Tramadol Hives and Itching    HEADACHE    Past Medical History:  Diagnosis Date   Arthritis    lower back   Fibromyalgia    GERD (gastroesophageal reflux disease)    prilosec not reordered-  not taking x approx 3 weeks   Headache(784.0)    migraines   Hematuria    chronic   Hidradenitis 10/2011   right groin   Hypertension     Simple cyst of kidney    states normal kidney function, except for hematuria     Past Surgical History:  Procedure Laterality Date   DILATION AND CURETTAGE OF UTERUS  03/13/2011   EAR CYST EXCISION  2007   bil ears   HYDRADENITIS EXCISION  10/30/2011   Procedure: EXCISION HYDRADENITIS GROIN;  Surgeon: Wilmon Arms. Corliss Skains, MD;  Location: [facility];  Service: General;  Laterality: Bilateral;   HYSTEROSCOPY W/ ENDOMETRIAL ABLATION  03/13/2011   INGUINAL HIDRADENITIS EXCISION  09/30/2010   bilateral   IRRIGATION AND DEBRIDEMENT SEBACEOUS CYST  03/01/2009   exc. sebaceous cyst x 4 bilateral groin   LAPAROSCOPY N/A 10/28/2012   Procedure: LAPAROSCOPY DIAGNOSTIC; PERIOTONEAL BIOPSIES;  Surgeon: Antionette Char, MD;  Location: [facility];  Service: Gynecology;  Laterality: N/A;   SALPINGECTOMY  08/25/2006   laparoscopic - right   TONSILLECTOMY     & adenoids as a child   TUBAL LIGATION  09/05/2010   Procedure: ESSURE TUBAL STERILIZATION;  Surgeon: Roseanna Rainbow, MD;  Location: [facility];  Service: Gynecology;  Laterality: Left;   TUMOR REMOVAL  2009   left knee   TUMOR REMOVAL  2010   left foot   TUMOR REMOVAL  2011   right salivary gland   TUMOR REMOVAL  2011   left side of head    Family History  Problem  Relation Age of Onset   Endometriosis Mother    Cancer Maternal Aunt        breast   Endometriosis Maternal Aunt    Cancer Maternal Uncle        lung   Endometriosis Maternal Grandmother     Social History   Tobacco Use   Smoking status: Current Every Day Smoker    Packs/day: 0.50    Years: 8.00    Pack years: 4.00    Types: Cigarettes   Smokeless tobacco: Never Used  Substance Use Topics   Alcohol use: No   Drug use: No    ROS   Objective:   Vitals: BP (!) 187/104 (BP Location: Left Arm)    Pulse 79    Temp 98.3 F (36.8 C) (Oral)    Resp 16    SpO2 97%   Wt Readings from Last 3 Encounters:  02/21/19 150 lb (68 kg)    12/19/12 205 lb (93 kg)  10/28/12 200 lb (90.7 kg)   Temp Readings from Last 3 Encounters:  08/22/19 98.3 F (36.8 C) (Oral)  03/23/19 98.5 F (36.9 C) (Oral)  02/21/19 98.7 F (37.1 C) (Oral)   BP Readings from Last 3 Encounters:  08/22/19 (!) 187/104  03/23/19 (!) 166/99  02/21/19 (!) 170/106   Pulse Readings from Last 3 Encounters:  08/22/19 79  03/23/19 71  02/21/19 70   Physical Exam Constitutional:      General: She is not in acute distress.    Appearance: Normal appearance. She is well-developed. She is ill-appearing. She is not toxic-appearing or diaphoretic.  HENT:     Head: Normocephalic and atraumatic.     Right Ear: Tympanic membrane, ear canal and external ear normal. No drainage or tenderness. No middle ear effusion. Tympanic membrane is not erythematous.     Left Ear: Tympanic membrane, ear canal and external ear normal. No drainage or tenderness.  No middle ear effusion. Tympanic membrane is not erythematous.     Nose: Congestion and rhinorrhea present.     Mouth/Throat:     Mouth: Mucous membranes are moist. No oral lesions.     Pharynx: No pharyngeal swelling, oropharyngeal exudate, posterior oropharyngeal erythema or uvula swelling.     Tonsils: No tonsillar exudate or tonsillar abscesses.  Eyes:     General: No scleral icterus.       Right eye: No discharge.        Left eye: No discharge.     Extraocular Movements: Extraocular movements intact.     Right eye: Normal extraocular motion.     Left eye: Normal extraocular motion.     Conjunctiva/sclera: Conjunctivae normal.     Pupils: Pupils are equal, round, and reactive to light.  Cardiovascular:     Rate and Rhythm: Normal rate and regular rhythm.     Pulses: Normal pulses.     Heart sounds: Normal heart sounds. No murmur heard.  No friction rub. No gallop.   Pulmonary:     Effort: Pulmonary effort is normal. No respiratory distress.     Breath sounds: No stridor. Rhonchi (Mild over mid lung  fields bilaterally) present. No wheezing or rales.  Musculoskeletal:     Cervical back: Normal range of motion and neck supple.  Lymphadenopathy:     Cervical: No cervical adenopathy.  Skin:    General: Skin is warm and dry.     Findings: No rash.  Neurological:     General: No focal deficit present.  Mental Status: She is alert and oriented to person, place, and time.     Cranial Nerves: No cranial nerve deficit.     Motor: No weakness.     Coordination: Coordination normal.     Gait: Gait normal.     Deep Tendon Reflexes: Reflexes normal.  Psychiatric:        Mood and Affect: Mood normal.        Behavior: Behavior normal.        Thought Content: Thought content normal.        Judgment: Judgment normal.     DG Chest 2 View  Result Date: 08/22/2019 CLINICAL DATA:  Productive cough EXAM: CHEST - 2 VIEW COMPARISON:  None available FINDINGS: The heart size and mediastinal contours are within normal limits. Both lungs are clear. The visualized skeletal structures are unremarkable. IMPRESSION: No active cardiopulmonary disease. Electronically Signed   By: Marnee Spring M.D.   On: 08/22/2019 11:14   Assessment and Plan :   PDMP not reviewed this encounter.  1. Viral URI with cough   2. Body aches   3. Shortness of breath   4. Smoker   5. Atypical chest pain   6. Diarrhea, unspecified type   7. Generalized headaches   8. Essential hypertension   9. Elevated blood pressure reading     Will manage for viral illness such as viral URI, viral syndrome, viral rhinitis, COVID-19. Counseled patient on nature of COVID-19 including modes of transmission, diagnostic testing, management and supportive care.  Offered scripts for symptomatic relief. COVID 19 testing is pending.  Recommended she start amlodipine, follow-up with her PCP as soon as possible for her blood pressure management.  Counseled patient on potential for adverse effects with medications prescribed/recommended today, ER  and return-to-clinic precautions discussed, patient verbalized understanding.     Wallis Bamberg, PA-C 08/22/19 1156

## 2019-08-22 NOTE — Discharge Instructions (Signed)
We will notify you of your COVID-19 test results as they arrive and may take between 24 to 48 hours.  I encourage you to sign up for MyChart if you have not already done so as this can be the easiest way for us to communicate results to you online or through a phone app.  In the meantime, if you develop worsening symptoms including fever, chest pain, shortness of breath despite our current treatment plan then please report to the emergency room as this may be a sign of worsening status from possible COVID-19 infection.  Otherwise, we will manage this as a viral syndrome. For sore throat or cough try using a honey-based tea. Use 3 teaspoons of honey with juice squeezed from half lemon. Place shaved pieces of ginger into 1/2-1 cup of water and warm over stove top. Then mix the ingredients and repeat every 4 hours as needed. Please take Tylenol 500mg-650mg every 6 hours for aches and pains, fevers. Hydrate very well with at least 2 liters of water. Eat light meals such as soups to replenish electrolytes and soft fruits, veggies. Start an antihistamine like Zyrtec, Allegra or Claritin for postnasal drainage, sinus congestion.    For diabetes or elevated blood sugar, please make sure you are avoiding starchy, carbohydrate foods like pasta, breads, pastry, rice, potatoes, desserts. These foods can elevated your blood sugar. Also, avoid sodas, sweet teas, sugary beverages, fruit juices.  Drinking plain water will be much more helpful, try 64 ounces of water daily.  It is okay to flavor your water naturally by cutting cucumber, lemon, mint or lime, placing it in a picture with water and drinking it over a period of 2 to 3 days as long as it remains refrigerated.    For elevated blood pressure, make sure you are monitoring salt in your diet.  Do not eat restaurant foods and limit processed foods at home, prepare/cook your own foods at home.  Processed foods include things like frozen meals preseasoned meats and  dinners, deli meats, canned foods as they are high in sodium/salt.  Make sure your pain attention to sodium labels on foods you by at the grocery store.  For seasoning you can use a brand called Mrs. Dash which includes a lot of salt free seasonings.  Salads - kale, spinach, cabbage, spring mix; use seeds like pumpkin seeds or sunflower seeds, almonds, walnuts or pecans; you can also use 1-2 hard boiled eggs in your salads Fruits - avocadoes, berries (blueberries, raspberries, blackberries), apples, oranges, pomegranate, pear; avoid eating bananas, grapes regularly Vegetables - aspargus, cauliflower, broccoli, green beans, brussel spouts, bell peppers; stay away from starchy vegetables like potatoes, carrots, peas  Regarding meat it is better to eat lean meats and limit your red meat including pork to once a week.  Wild caught fish, chicken breast are good options as they tend to be leaner sources of good protein.   DO NOT EAT ANY FOODS ON THIS LIST THAT YOU ARE ALLERGIC TO.  

## 2019-08-22 NOTE — ED Triage Notes (Signed)
Pt productive cough, migraine, bodyaches, SOB x 3 days. Pt states she has had to call out of work twice due to weakness and fatigue and body aches.

## 2020-01-06 DIAGNOSIS — Z419 Encounter for procedure for purposes other than remedying health state, unspecified: Secondary | ICD-10-CM | POA: Diagnosis not present

## 2020-02-06 DIAGNOSIS — Z419 Encounter for procedure for purposes other than remedying health state, unspecified: Secondary | ICD-10-CM | POA: Diagnosis not present

## 2020-03-05 DIAGNOSIS — Z419 Encounter for procedure for purposes other than remedying health state, unspecified: Secondary | ICD-10-CM | POA: Diagnosis not present

## 2020-04-05 DIAGNOSIS — Z419 Encounter for procedure for purposes other than remedying health state, unspecified: Secondary | ICD-10-CM | POA: Diagnosis not present

## 2020-05-05 DIAGNOSIS — Z419 Encounter for procedure for purposes other than remedying health state, unspecified: Secondary | ICD-10-CM | POA: Diagnosis not present

## 2020-06-05 DIAGNOSIS — Z419 Encounter for procedure for purposes other than remedying health state, unspecified: Secondary | ICD-10-CM | POA: Diagnosis not present

## 2020-07-04 ENCOUNTER — Emergency Department (HOSPITAL_COMMUNITY): Payer: Managed Care, Other (non HMO)

## 2020-07-04 ENCOUNTER — Emergency Department (HOSPITAL_COMMUNITY)
Admission: EM | Admit: 2020-07-04 | Discharge: 2020-07-05 | Disposition: A | Discharge status: Home / Self Care | Payer: Managed Care, Other (non HMO) | Attending: Emergency Medicine | Admitting: Emergency Medicine

## 2020-07-04 DIAGNOSIS — F419 Anxiety disorder, unspecified: Secondary | ICD-10-CM

## 2020-07-04 DIAGNOSIS — R079 Chest pain, unspecified: Secondary | ICD-10-CM | POA: Diagnosis present

## 2020-07-04 DIAGNOSIS — F17210 Nicotine dependence, cigarettes, uncomplicated: Secondary | ICD-10-CM | POA: Insufficient documentation

## 2020-07-04 DIAGNOSIS — R519 Headache, unspecified: Secondary | ICD-10-CM | POA: Diagnosis not present

## 2020-07-04 DIAGNOSIS — R42 Dizziness and giddiness: Secondary | ICD-10-CM | POA: Insufficient documentation

## 2020-07-04 DIAGNOSIS — Z79899 Other long term (current) drug therapy: Secondary | ICD-10-CM | POA: Insufficient documentation

## 2020-07-04 DIAGNOSIS — I10 Essential (primary) hypertension: Secondary | ICD-10-CM | POA: Insufficient documentation

## 2020-07-04 DIAGNOSIS — R002 Palpitations: Secondary | ICD-10-CM | POA: Diagnosis not present

## 2020-07-04 LAB — CBC WITH DIFFERENTIAL/PLATELET
Abs Immature Granulocytes: 0.04 10*3/uL (ref 0.00–0.07)
Basophils Absolute: 0.1 10*3/uL (ref 0.0–0.1)
Basophils Relative: 1 %
Eosinophils Absolute: 0.2 10*3/uL (ref 0.0–0.5)
Eosinophils Relative: 1 %
HCT: 45.7 % (ref 36.0–46.0)
Hemoglobin: 15.6 g/dL — ABNORMAL HIGH (ref 12.0–15.0)
Immature Granulocytes: 0 %
Lymphocytes Relative: 34 %
Lymphs Abs: 4.4 10*3/uL — ABNORMAL HIGH (ref 0.7–4.0)
MCH: 31 pg (ref 26.0–34.0)
MCHC: 34.1 g/dL (ref 30.0–36.0)
MCV: 90.7 fL (ref 80.0–100.0)
Monocytes Absolute: 0.8 10*3/uL (ref 0.1–1.0)
Monocytes Relative: 6 %
Neutro Abs: 7.5 10*3/uL (ref 1.7–7.7)
Neutrophils Relative %: 58 %
Platelets: 307 10*3/uL (ref 150–400)
RBC: 5.04 MIL/uL (ref 3.87–5.11)
RDW: 12.3 % (ref 11.5–15.5)
WBC: 13 10*3/uL — ABNORMAL HIGH (ref 4.0–10.5)
nRBC: 0 % (ref 0.0–0.2)

## 2020-07-04 LAB — COMPREHENSIVE METABOLIC PANEL
ALT: 13 U/L (ref 0–44)
AST: 17 U/L (ref 15–41)
Albumin: 4.4 g/dL (ref 3.5–5.0)
Alkaline Phosphatase: 57 U/L (ref 38–126)
Anion gap: 9 (ref 5–15)
BUN: 12 mg/dL (ref 6–20)
CO2: 25 mmol/L (ref 22–32)
Calcium: 9.4 mg/dL (ref 8.9–10.3)
Chloride: 101 mmol/L (ref 98–111)
Creatinine, Ser: 0.48 mg/dL (ref 0.44–1.00)
GFR, Estimated: >60 mL/min (ref >60)
Glucose, Bld: 105 mg/dL — ABNORMAL HIGH (ref 70–99)
Potassium: 3 mmol/L — ABNORMAL LOW (ref 3.5–5.1)
Sodium: 135 mmol/L (ref 135–145)
Total Bilirubin: 0.7 mg/dL (ref 0.3–1.2)
Total Protein: 7.9 g/dL (ref 6.5–8.1)

## 2020-07-04 LAB — TROPONIN I (HIGH SENSITIVITY): Troponin I (High Sensitivity): 6 ng/L (ref <18)

## 2020-07-04 LAB — I-STAT BETA HCG BLOOD, ED (MC, WL, AP ONLY): I-stat hCG, quantitative: <5 m[IU]/mL (ref <5)

## 2020-07-04 NOTE — ED Provider Notes (Signed)
Emergency Medicine Provider Triage Evaluation Note  ANASTASSIA NOACK , a 35 y.o. female  was evaluated in triage.  Pt complains of pain hypertension, chest pain, dizziness, palpitations.  X1 week.  Patient reports that chest pain, dizziness, palpitations have gotten gradually worse over this time.  Patient reports that she has a history of hypertension and is compliant with her medications.  Chest pain has been constant over the last week.  Pain is located xiphoid process.  Patient endorses visual disturbance stating she has "foggy, dark spots, gray tone."  Patient denies any numbness, weakness, facial asymmetry, slurred speech, seizures  Review of Systems  Positive: Chest pain, dizziness, palpitation Negative: Numbness, weakness, facial asymmetry, slurred speech,   Physical Exam  BP (!) 220/118 (BP Location: Left Arm)   Pulse 83   Temp 99.2 F (37.3 C) (Oral)   Resp 16   SpO2 100%  Gen:   Awake, no distress   Resp:  Normal effort, lungs clear to auscultation bilaterally MSK:   Moves extremities without difficulty , 5 strength to bilateral upper and lower extremities Other:  CN II through XII intact, station to light touch intact to bilateral upper and lower extremities.  S1, S2 present with no murmurs rubs or gallops.  Medical Decision Making  Medically screening exam initiated at 9:06 PM.  Appropriate orders placed.  Ron Parker was informed that the remainder of the evaluation will be completed by another provider, this initial triage assessment does not replace that evaluation, and the importance of remaining in the ED until their evaluation is complete.  Due to concern for hypertensive emergency we will have patient moved back to next available room.      Berneice Heinrich 07/04/20 2111    Melene Plan, DO 07/04/20 2210

## 2020-07-04 NOTE — ED Triage Notes (Signed)
Pt c/o CP, dizziness, HTN, palpitations x1wk, states it's gotten progressively worse. States hx of HTN, compliant w meds, was told by PCP that she was "high stroke risk." States this is the worst she's felt since diagnosis. Pt passes stroke eval in triage, states that vision is "foggy, dark spots, gray-tone."

## 2020-07-05 ENCOUNTER — Other Ambulatory Visit: Payer: Self-pay

## 2020-07-05 DIAGNOSIS — Z419 Encounter for procedure for purposes other than remedying health state, unspecified: Secondary | ICD-10-CM | POA: Diagnosis not present

## 2020-07-05 DIAGNOSIS — I10 Essential (primary) hypertension: Secondary | ICD-10-CM | POA: Diagnosis not present

## 2020-07-05 LAB — TROPONIN I (HIGH SENSITIVITY): Troponin I (High Sensitivity): 5 ng/L (ref <18)

## 2020-07-05 MED ORDER — AMLODIPINE BESYLATE 5 MG PO TABS
5.0000 mg | ORAL_TABLET | Freq: Once | ORAL | Status: AC
  Administered 2020-07-05: 5 mg via ORAL
  Filled 2020-07-05: qty 1

## 2020-07-05 MED ORDER — LORAZEPAM 1 MG PO TABS
0.5000 mg | ORAL_TABLET | Freq: Once | ORAL | Status: AC
  Administered 2020-07-05: 0.5 mg via ORAL
  Filled 2020-07-05: qty 1

## 2020-07-05 MED ORDER — LORAZEPAM 1 MG PO TABS: 1.0000 mg | ORAL_TABLET | Freq: Three times a day (TID) | ORAL | 0 refills | Status: DC | PRN

## 2020-07-05 NOTE — ED Notes (Signed)
Visual Acuity Completed   20 Feet  Right 20/50 Left 20/63

## 2020-07-05 NOTE — ED Notes (Signed)
Provider is at the bedside. 

## 2020-07-05 NOTE — Discharge Instructions (Addendum)
Please cut back on your Excedrin and caffeine use You can take 10 mg of amlodipine every day

## 2020-07-05 NOTE — ED Notes (Signed)
Notified provider about pt BP increasing. Pt states she takes amlodipine once a day. She had her dose today.

## 2020-07-05 NOTE — ED Provider Notes (Signed)
[facility] EMERGENCY DEPARTMENT Provider Note   CSN: [id number] Arrival date & time: 07/04/20  2019     History Chief Complaint  Patient presents with   Chest Pain   Dizziness   Palpitations   Hypertension    Claire Caldwell is a 35 y.o. female.  The history is provided by the patient.  Hypertension This is a chronic problem. The current episode started more than 1 week ago. The problem occurs daily. The problem has been gradually worsening. Associated symptoms include headaches. Pertinent negatives include no abdominal pain and no shortness of breath. Nothing aggravates the symptoms. Nothing relieves the symptoms.  Patient with history of hypertension presents with palpitations, anxiety at times feeling her heartbeat in her lower chest. Patient reports she has had hypertension for several years.  She is currently only on Norvasc 5 mg daily at the direction of her PCP.  She reports over the past week she has had increasing palpitations and feeling that her heart is beating in her lower chest.  She reports it makes her body shake at times.  She is also feeling lightheaded at times.  She also reports visual changes such as blurred vision but that is improved.  She does not wear glasses consistently.  No focal weakness or numbness.  No fevers or vomiting.  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  She also takes Excedrin daily for her chronic headaches.  She sometimes will take up to 16-20 tablets of Excedrin tension (ASA free) per day over a 24-hour period.  Denies any cocaine abuse.  She is not on oral contraceptives.  No previous history of stroke.  No family history of stroke at young age. Patient reports frequent panic attacks.    Past Medical History:  Diagnosis Date   Arthritis    lower back   Fibromyalgia    GERD (gastroesophageal reflux disease)    prilosec not reordered-  not taking x approx 3 weeks   Headache(784.0)     migraines   Hematuria    chronic   Hidradenitis 10/2011   right groin   Hypertension    Simple cyst of kidney    states normal kidney function, except for hematuria    Patient Active Problem List   Diagnosis Date Noted   Postop check 12/20/2012   Fibromyalgia 10/14/2012   Unspecified symptom associated with female genital organs 10/14/2012   Hidradenitis suppurativa 01/25/2012   Anal fissure 01/15/2012   Disorders of sacrum 04/16/2011   Lumbago 03/23/2011   Trochanteric bursitis of left hip 03/23/2011   Irregular menstrual cycle 03/13/2011   Hidradenitis-bil groins 09/16/2010   Sterilization 09/05/2010   DEPRESSION 11/22/2009   ACID REFLUX DISEASE 11/22/2009   SYNCOPE AND COLLAPSE 11/22/2009   DIZZINESS 11/22/2009   PALPITATIONS 11/22/2009   CARDIAC MURMUR 11/22/2009   NERVOUSNESS 11/22/2009    Past Surgical History:  Procedure Laterality Date   DILATION AND CURETTAGE OF UTERUS  03/13/2011   EAR CYST EXCISION  2007   bil ears   HYDRADENITIS EXCISION  10/30/2011   Procedure: EXCISION HYDRADENITIS GROIN;  Surgeon: Wilmon Arms. Corliss Skains, MD;  Location: [facility];  Service: General;  Laterality: Bilateral;   HYSTEROSCOPY W/ ENDOMETRIAL ABLATION  03/13/2011   INGUINAL HIDRADENITIS EXCISION  09/30/2010   bilateral   IRRIGATION AND DEBRIDEMENT SEBACEOUS CYST  03/01/2009   exc. sebaceous cyst x 4 bilateral groin   LAPAROSCOPY N/A 10/28/2012   Procedure: LAPAROSCOPY DIAGNOSTIC;  PERIOTONEAL BIOPSIES;  Surgeon: Antionette CharLisa [name], MD;  Location: [facility];  Service: Gynecology;  Laterality: N/A;   SALPINGECTOMY  08/25/2006   laparoscopic - right   TONSILLECTOMY     & adenoids as a child   TUBAL LIGATION  09/05/2010   Procedure: ESSURE TUBAL STERILIZATION;  Surgeon: Roseanna RainbowLisa A [name], MD;  Location: [facility];  Service: Gynecology;  Laterality: Left;   TUMOR REMOVAL  2009   left knee   TUMOR REMOVAL  2010   left foot   TUMOR REMOVAL  2011   right salivary gland    TUMOR REMOVAL  2011   left side of head     OB History     Gravida  5   Para  3   Term  3   Preterm  0   AB  2   Living  3      SAB  1   IAB  0   Ectopic  1   Multiple  0   Live Births  3           Family History  Problem Relation Age of Onset   Endometriosis Mother    Cancer Maternal Aunt        breast   Endometriosis Maternal Aunt    Cancer Maternal Uncle        lung   Endometriosis Maternal Grandmother     Social History   Tobacco Use   Smoking status: Every Day    Packs/day: 0.50    Years: 8.00    Pack years: 4.00    Types: Cigarettes   Smokeless tobacco: Never  Substance Use Topics   Alcohol use: No   Drug use: No    Home Medications Prior to Admission medications   Medication Sig Start Date End Date Taking? Authorizing Provider  amLODipine (NORVASC) 5 MG tablet Take 1 tablet (5 mg total) by mouth daily. 08/22/19   Wallis BambergMani, Mario, PA-C  cetirizine (ZYRTEC ALLERGY) 10 MG tablet Take 1 tablet (10 mg total) by mouth daily. 08/22/19   Wallis BambergMani, Mario, PA-C  loperamide (IMODIUM) 2 MG capsule Take 1 capsule (2 mg total) by mouth daily as needed for diarrhea or loose stools. 08/22/19   Wallis BambergMani, Mario, PA-C  OVER THE COUNTER MEDICATION Take 1 capsule by mouth in the morning and at bedtime. "[place]"    [provider]  sertraline (ZOLOFT) 100 MG tablet SMARTSIG:1 Tablet(s) By Mouth Every Evening 07/19/19   [provider]  zolpidem (AMBIEN) 10 MG tablet Take 10 mg by mouth at bedtime as needed. 08/01/19   [provider]    Allergies    Ibuprofen, Meloxicam, and Tramadol  Review of Systems   Review of Systems  Constitutional:  Negative for fever.  Eyes:  Positive for visual disturbance.       Blurred vision now resolved  Respiratory:  Negative for shortness of breath.   Cardiovascular:  Positive for palpitations.  Gastrointestinal:  Positive for nausea. Negative for abdominal pain and vomiting.  Neurological:  Positive for  light-headedness and headaches. Negative for weakness and numbness.       Chronic daily headache  Psychiatric/Behavioral:  Positive for sleep disturbance. The patient is nervous/anxious.        Patient works night shift, disrupts sleep  All other systems reviewed and are negative.  Physical Exam Updated Vital Signs BP (!) 193/112   Pulse 61   Temp 98.2 F (36.8 C) (Oral)   Resp (!) 22  Ht 1.727 m (5\' 8" )   Wt 70.3 kg   SpO2 98%   BMI 23.57 kg/m   Physical Exam CONSTITUTIONAL: Well developed/well nourished HEAD: Normocephalic/atraumatic EYES: EOMI/PERRL, no nystagmus, no visual field deficit  no ptosis, visual acuity noted in nursing notes ENMT: Mucous membranes moist NECK: supple no meningeal signs, no bruits CV: S1/S2 noted, no murmurs/rubs/gallops noted LUNGS: Lungs are clear to auscultation bilaterally, no apparent distress ABDOMEN: soft, nontender, no rebound or guarding GU:no cva tenderness NEURO:Awake/alert, face symmetric, no arm or leg drift is noted Equal 5/5 strength with shoulder abduction, elbow flex/extension, wrist flex/extension in upper extremities and equal hand grips bilaterally Equal 5/5 strength with hip flexion,knee flex/extension, foot dorsi/plantar flexion Cranial nerves 3/4/5/6/07/13/08/11/12 tested and intact Gait normal without ataxia No past pointing Sensation to light touch intact in all extremities EXTREMITIES: pulses normal, full ROM SKIN: warm, color normal PSYCH: no abnormalities of mood noted  ED Results / Procedures / Treatments   Labs (all labs ordered are listed, but only abnormal results are displayed) Labs Reviewed  COMPREHENSIVE METABOLIC PANEL - Abnormal; Notable for the following components:      Result Value   Potassium 3.0 (*)    Glucose, Bld 105 (*)    All other components within normal limits  CBC WITH DIFFERENTIAL/PLATELET - Abnormal; Notable for the following components:   WBC 13.0 (*)    Hemoglobin 15.6 (*)    Lymphs  Abs 4.4 (*)    All other components within normal limits  I-STAT BETA HCG BLOOD, ED ([facility], [facility], [facility] ONLY)  TROPONIN I (HIGH SENSITIVITY)  TROPONIN I (HIGH SENSITIVITY)    EKG EKG Interpretation  Date/Time:  Thursday July 04 2020 20:58:01 EDT Ventricular Rate:  82 PR Interval:  148 QRS Duration: 84 QT Interval:  368 QTC Calculation: 429 R Axis:   87 Text Interpretation: Normal sinus rhythm Biatrial enlargement ST & T wave abnormality, consider inferior ischemia Abnormal ECG no significant change when compared to EKG From 2011 Reconfirmed by 2012 (Zadie Rhine) on 07/05/2020 12:41:24 AM  Radiology DG Chest 2 View  Result Date: 07/04/2020 CLINICAL DATA:  Chest pain and dizziness. EXAM: CHEST - 2 VIEW COMPARISON:  August 22, 2019 FINDINGS: The heart size and mediastinal contours are within normal limits. Both lungs are clear. Bilateral radiopaque nipple piercings are seen. The visualized skeletal structures are unremarkable. IMPRESSION: No active cardiopulmonary disease. Electronically Signed   By: August 24, 2019 M.D.   On: 07/04/2020 21:23    Procedures Procedures   Medications Ordered in ED Medications  amLODipine (NORVASC) tablet 5 mg (5 mg Oral Given 07/05/20 0129)  LORazepam (ATIVAN) tablet 0.5 mg (0.5 mg Oral Given 07/05/20 0128)    ED Course  I have reviewed the triage vital signs and the nursing notes.  Pertinent labs & imaging results that were available during my care of the patient were reviewed by me and considered in my medical decision making (see chart for details).    MDM Rules/Calculators/A&P                          1:45 AM Patient presents with hypertension, palpitations and dizziness.  She reports her visual changes have improved.  She has no signs of stroke on exam.  No active chest pain at this time.  Patient is only on 1 medication for her blood pressure, will advise her to take 10 mg Norvasc daily.  She reports she has a PCP follow-up later  this month and  she will discuss with her doctor about any other new medicines.  She also reports frequent panic attacks and anxiety.  After further discussion, we agreed to start a short course of Ativan.  I counseled patient extensively on cutting back her caffeine/energy drinks as well as cutting back on the frequent use of Excedrin.  She reports she has headaches daily for quite some time, no acute changes. 2:17 AM Patient feels improved Blood pressure still elevated, but this appears to be a chronic issue.  She has no signs of acute hypertensive emergency Patient reports she works up to 15 hours at work on night shift.  She gets very little sleep and takes large quantities of Excedrin as well as energy drinks. No signs of liver dysfunction, however I did counsel her on cutting back on Excedrin due to the APAP formulation. Short course of Ativan given for her panic attacks. She will increase her amlodipine to 10 mg daily, and she already has follow with PCP later this month We discussed strict  return precaution Final Clinical Impression(s) / ED Diagnoses Final diagnoses:  Primary hypertension  Anxiety    Rx / DC Orders ED Discharge Orders          Ordered    LORazepam (ATIVAN) 1 MG tablet  Every 8 hours PRN        07/05/20 4650             Zadie Rhine, MD 07/05/20 [phone]

## 2020-07-26 ENCOUNTER — Other Ambulatory Visit: Payer: Self-pay | Admitting: Internal Medicine

## 2020-07-27 LAB — CBC
HCT: 40.4 % (ref 35.0–45.0)
Hemoglobin: 13.7 g/dL (ref 11.7–15.5)
MCH: 31.4 pg (ref 27.0–33.0)
MCHC: 33.9 g/dL (ref 32.0–36.0)
MCV: 92.4 fL (ref 80.0–100.0)
MPV: 10.5 fL (ref 7.5–12.5)
Platelets: 301 10*3/uL (ref 140–400)
RBC: 4.37 10*6/uL (ref 3.80–5.10)
RDW: 12.4 % (ref 11.0–15.0)
WBC: 8.8 10*3/uL (ref 3.8–10.8)

## 2020-07-27 LAB — COMPLETE METABOLIC PANEL WITH GFR
AG Ratio: 1.6 (calc) (ref 1.0–2.5)
ALT: 11 U/L (ref 6–29)
AST: 14 U/L (ref 10–30)
Albumin: 4.2 g/dL (ref 3.6–5.1)
Alkaline phosphatase (APISO): 51 U/L (ref 31–125)
BUN: 12 mg/dL (ref 7–25)
CO2: 26 mmol/L (ref 20–32)
Calcium: 9.5 mg/dL (ref 8.6–10.2)
Chloride: 105 mmol/L (ref 98–110)
Creat: 0.87 mg/dL (ref 0.50–0.97)
Globulin: 2.7 g/dL (calc) (ref 1.9–3.7)
Glucose, Bld: 102 mg/dL — ABNORMAL HIGH (ref 65–99)
Potassium: 4.1 mmol/L (ref 3.5–5.3)
Sodium: 140 mmol/L (ref 135–146)
Total Bilirubin: 0.4 mg/dL (ref 0.2–1.2)
Total Protein: 6.9 g/dL (ref 6.1–8.1)
eGFR: 90 mL/min/{1.73_m2} (ref >=60)

## 2020-07-27 LAB — LIPID PANEL
Cholesterol: 222 mg/dL — ABNORMAL HIGH (ref <200)
HDL: 83 mg/dL (ref >=50)
LDL Cholesterol (Calc): 120 mg/dL (calc) — ABNORMAL HIGH
Non-HDL Cholesterol (Calc): 139 mg/dL (calc) — ABNORMAL HIGH (ref <130)
Total CHOL/HDL Ratio: 2.7 (calc) (ref <5.0)
Triglycerides: 86 mg/dL (ref <150)

## 2020-07-27 LAB — VITAMIN D 25 HYDROXY (VIT D DEFICIENCY, FRACTURES): Vit D, 25-Hydroxy: 21 ng/mL — ABNORMAL LOW (ref 30–100)

## 2020-07-27 LAB — TSH: TSH: 0.88 mIU/L

## 2020-08-05 DIAGNOSIS — Z419 Encounter for procedure for purposes other than remedying health state, unspecified: Secondary | ICD-10-CM | POA: Diagnosis not present

## 2020-09-05 DIAGNOSIS — Z419 Encounter for procedure for purposes other than remedying health state, unspecified: Secondary | ICD-10-CM | POA: Diagnosis not present

## 2020-09-24 ENCOUNTER — Ambulatory Visit (INDEPENDENT_AMBULATORY_CARE_PROVIDER_SITE_OTHER): Payer: Managed Care, Other (non HMO) | Admitting: Obstetrics & Gynecology

## 2020-09-24 ENCOUNTER — Encounter: Payer: Self-pay | Admitting: Obstetrics & Gynecology

## 2020-09-24 ENCOUNTER — Other Ambulatory Visit: Payer: Self-pay

## 2020-09-24 ENCOUNTER — Other Ambulatory Visit (HOSPITAL_COMMUNITY)
Admission: RE | Admit: 2020-09-24 | Discharge: 2020-09-24 | Disposition: A | Discharge status: Home / Self Care | Payer: Managed Care, Other (non HMO) | Source: Ambulatory Visit | Attending: Obstetrics & Gynecology | Admitting: Obstetrics & Gynecology

## 2020-09-24 VITALS — BP 160/103 | HR 76 | Wt 163.3 lb

## 2020-09-24 DIAGNOSIS — Z01419 Encounter for gynecological examination (general) (routine) without abnormal findings: Secondary | ICD-10-CM | POA: Insufficient documentation

## 2020-09-24 DIAGNOSIS — I159 Secondary hypertension, unspecified: Secondary | ICD-10-CM | POA: Diagnosis not present

## 2020-09-24 DIAGNOSIS — R102 Pelvic and perineal pain: Secondary | ICD-10-CM

## 2020-09-24 MED ORDER — CELECOXIB 200 MG PO CAPS: 200.0000 mg | ORAL_CAPSULE | Freq: Every day | ORAL | 1 refills | Status: AC

## 2020-09-24 NOTE — Progress Notes (Signed)
Patient ID: Claire Caldwell, female   DOB: 1985-03-28, 35 y.o.   MRN: [id number]  Chief Complaint  Patient presents with   New Patient (Initial Visit)    HPI Claire Caldwell is a 35 y.o. female.  [id number] No LMP recorded. Patient has had an ablation. S/P Essure and subsequent endometrial ablation and she is amenorrheic. She does have cyclic pelvic pain like menstrual cramps. She takes Excedrin and hydroxyzine and ibuprofen although NSAIDS cause her to itch.   HPI  Past Medical History:  Diagnosis Date   Arthritis    lower back   Fibromyalgia    GERD (gastroesophageal reflux disease)    prilosec not reordered-  not taking x approx 3 weeks   Headache(784.0)    migraines   Hematuria    chronic   Hidradenitis 10/2011   right groin   Hypertension    Simple cyst of kidney    states normal kidney function, except for hematuria    Past Surgical History:  Procedure Laterality Date   DILATION AND CURETTAGE OF UTERUS  03/13/2011   EAR CYST EXCISION  2007   bil ears   HYDRADENITIS EXCISION  10/30/2011   Procedure: EXCISION HYDRADENITIS GROIN;  Surgeon: Wilmon Arms. Corliss Skains, MD;  Location: [facility];  Service: General;  Laterality: Bilateral;   HYSTEROSCOPY W/ ENDOMETRIAL ABLATION  03/13/2011   INGUINAL HIDRADENITIS EXCISION  09/30/2010   bilateral   IRRIGATION AND DEBRIDEMENT SEBACEOUS CYST  03/01/2009   exc. sebaceous cyst x 4 bilateral groin   LAPAROSCOPY N/A 10/28/2012   Procedure: LAPAROSCOPY DIAGNOSTIC; PERIOTONEAL BIOPSIES;  Surgeon: Antionette Char, MD;  Location: [facility];  Service: Gynecology;  Laterality: N/A;   SALPINGECTOMY  08/25/2006   laparoscopic - right   TONSILLECTOMY     & adenoids as a child   TUBAL LIGATION  09/05/2010   Procedure: ESSURE TUBAL STERILIZATION;  Surgeon: Roseanna Rainbow, MD;  Location: [facility];  Service: Gynecology;  Laterality: Left;   TUMOR REMOVAL  2009   left knee   TUMOR REMOVAL  2010   left foot   TUMOR REMOVAL  2011   right  salivary gland   TUMOR REMOVAL  2011   left side of head    Family History  Problem Relation Age of Onset   Endometriosis Mother    Cancer Maternal Aunt        breast   Endometriosis Maternal Aunt    Cancer Maternal Uncle        lung   Endometriosis Maternal Grandmother     Social History Social History   Tobacco Use   Smoking status: Every Day    Packs/day: 0.50    Years: 8.00    Pack years: 4.00    Types: Cigarettes   Smokeless tobacco: Never  Substance Use Topics   Alcohol use: No   Drug use: No    Allergies  Allergen Reactions   Ibuprofen Itching and Rash   Meloxicam Itching   Tramadol Hives and Itching    HEADACHE    Current Outpatient Medications  Medication Sig Dispense Refill   amLODipine (NORVASC) 5 MG tablet Take 1 tablet (5 mg total) by mouth daily. 90 tablet 0   cetirizine (ZYRTEC ALLERGY) 10 MG tablet Take 1 tablet (10 mg total) by mouth daily. 30 tablet 0   loperamide (IMODIUM) 2 MG capsule Take 1 capsule (2 mg total) by mouth daily as needed for diarrhea or loose stools. 10 capsule 0   LORazepam (  ATIVAN) 1 MG tablet Take 1 tablet (1 mg total) by mouth every 8 (eight) hours as needed for anxiety. 10 tablet 0   sertraline (ZOLOFT) 100 MG tablet Take 100 mg by mouth every evening.     zolpidem (AMBIEN) 10 MG tablet Take 10 mg by mouth at bedtime as needed for sleep.     aspirin-acetaminophen-caffeine (EXCEDRIN MIGRAINE) 250-250-65 MG tablet Take 4 tablets by mouth every 6 (six) hours as needed for headache or migraine. (Patient not taking: Reported on 09/24/2020)     No current facility-administered medications for this visit.    Review of Systems Review of Systems  Constitutional: Negative.   Respiratory: Negative.    Cardiovascular: Negative.   Genitourinary:  Positive for pelvic pain (cyclic like menstrual pain). Negative for dyspareunia, vaginal bleeding and vaginal discharge.   Blood pressure (!) 160/103, pulse 76, weight 163 lb 4.8 oz (74.1  kg).  Physical Exam Physical Exam Vitals and nursing note reviewed. Exam conducted with a chaperone present.  Constitutional:      Appearance: Normal appearance.  Cardiovascular:     Rate and Rhythm: Normal rate.  Pulmonary:     Effort: Pulmonary effort is normal.  Chest:  Breasts:    Right: Normal.     Left: Normal.     Comments: Bilateral nipple piercing Genitourinary:    General: Normal vulva.     Exam position: Lithotomy position.     Vagina: Normal.     Cervix: Normal.     Uterus: Normal.      Adnexa: Right adnexa normal and left adnexa normal.  Lymphadenopathy:     Upper Body:     Right upper body: No axillary adenopathy.     Left upper body: No axillary adenopathy.  Skin:    General: Skin is warm and dry.  Neurological:     Mental Status: She is alert.    Data Reviewed Pap 2014  Assessment Well woman exam with routine gynecological exam - Plan: Cytology - PAP( [facility]), Cervicovaginal ancillary only( [place]), HIV Antibody (routine testing w rflx), RPR, Hepatitis C Antibody  Secondary hypertension  Pelvic pain in female - Plan: celecoxib (CELEBREX) 200 MG capsule   Plan See PCP for poorly controlled HTN Try Celebrex for cramps     Scheryl Darter 09/24/2020, 8:53 AM

## 2020-09-24 NOTE — Progress Notes (Signed)
Patient is here for Annual. She would like STI testing.

## 2020-09-25 LAB — CERVICOVAGINAL ANCILLARY ONLY
Bacterial Vaginitis (gardnerella): POSITIVE — AB
Candida Glabrata: NEGATIVE
Candida Vaginitis: NEGATIVE
Chlamydia: NEGATIVE
Comment: NEGATIVE
Comment: NEGATIVE
Comment: NEGATIVE
Comment: NEGATIVE
Comment: NEGATIVE
Comment: NORMAL
Neisseria Gonorrhea: NEGATIVE
Trichomonas: NEGATIVE

## 2020-09-25 LAB — HIV ANTIBODY (ROUTINE TESTING W REFLEX): HIV Screen 4th Generation wRfx: NONREACTIVE

## 2020-09-25 LAB — HEPATITIS C ANTIBODY: Hep C Virus Ab: <0.1 s/co ratio (ref 0.0–0.9)

## 2020-09-25 LAB — RPR: RPR Ser Ql: NONREACTIVE

## 2020-09-26 LAB — CYTOLOGY - PAP
Comment: NEGATIVE
Diagnosis: NEGATIVE
High risk HPV: NEGATIVE

## 2020-10-05 DIAGNOSIS — Z419 Encounter for procedure for purposes other than remedying health state, unspecified: Secondary | ICD-10-CM | POA: Diagnosis not present

## 2020-11-05 DIAGNOSIS — Z419 Encounter for procedure for purposes other than remedying health state, unspecified: Secondary | ICD-10-CM | POA: Diagnosis not present

## 2020-12-05 DIAGNOSIS — Z419 Encounter for procedure for purposes other than remedying health state, unspecified: Secondary | ICD-10-CM | POA: Diagnosis not present

## 2021-01-05 DIAGNOSIS — Z419 Encounter for procedure for purposes other than remedying health state, unspecified: Secondary | ICD-10-CM | POA: Diagnosis not present

## 2021-02-05 DIAGNOSIS — Z419 Encounter for procedure for purposes other than remedying health state, unspecified: Secondary | ICD-10-CM | POA: Diagnosis not present

## 2021-02-18 DIAGNOSIS — M609 Myositis, unspecified: Secondary | ICD-10-CM | POA: Diagnosis not present

## 2021-02-18 DIAGNOSIS — F411 Generalized anxiety disorder: Secondary | ICD-10-CM | POA: Diagnosis not present

## 2021-02-18 DIAGNOSIS — G43909 Migraine, unspecified, not intractable, without status migrainosus: Secondary | ICD-10-CM | POA: Diagnosis not present

## 2021-02-18 DIAGNOSIS — I10 Essential (primary) hypertension: Secondary | ICD-10-CM | POA: Diagnosis not present

## 2021-03-04 DIAGNOSIS — I10 Essential (primary) hypertension: Secondary | ICD-10-CM | POA: Diagnosis not present

## 2021-03-04 DIAGNOSIS — F419 Anxiety disorder, unspecified: Secondary | ICD-10-CM | POA: Diagnosis not present

## 2021-03-05 DIAGNOSIS — Z419 Encounter for procedure for purposes other than remedying health state, unspecified: Secondary | ICD-10-CM | POA: Diagnosis not present

## 2021-04-05 DIAGNOSIS — Z419 Encounter for procedure for purposes other than remedying health state, unspecified: Secondary | ICD-10-CM | POA: Diagnosis not present

## 2021-05-05 DIAGNOSIS — Z419 Encounter for procedure for purposes other than remedying health state, unspecified: Secondary | ICD-10-CM | POA: Diagnosis not present

## 2021-06-05 DIAGNOSIS — Z419 Encounter for procedure for purposes other than remedying health state, unspecified: Secondary | ICD-10-CM | POA: Diagnosis not present

## 2021-06-08 ENCOUNTER — Inpatient Hospital Stay (HOSPITAL_COMMUNITY)
Admission: EM | Admit: 2021-06-08 | Discharge: 2021-06-13 | DRG: 872 | Disposition: A | Discharge status: Home / Self Care | Payer: Medicaid Other | Attending: Internal Medicine | Admitting: Internal Medicine

## 2021-06-08 ENCOUNTER — Emergency Department (HOSPITAL_COMMUNITY): Payer: Medicaid Other

## 2021-06-08 ENCOUNTER — Other Ambulatory Visit: Payer: Self-pay

## 2021-06-08 ENCOUNTER — Encounter (HOSPITAL_COMMUNITY): Payer: Self-pay | Admitting: Emergency Medicine

## 2021-06-08 DIAGNOSIS — I10 Essential (primary) hypertension: Secondary | ICD-10-CM | POA: Diagnosis not present

## 2021-06-08 DIAGNOSIS — N281 Cyst of kidney, acquired: Secondary | ICD-10-CM | POA: Diagnosis not present

## 2021-06-08 DIAGNOSIS — R509 Fever, unspecified: Secondary | ICD-10-CM | POA: Diagnosis not present

## 2021-06-08 DIAGNOSIS — N12 Tubulo-interstitial nephritis, not specified as acute or chronic: Secondary | ICD-10-CM | POA: Diagnosis not present

## 2021-06-08 DIAGNOSIS — A4151 Sepsis due to Escherichia coli [E. coli]: Principal | ICD-10-CM | POA: Diagnosis present

## 2021-06-08 DIAGNOSIS — M797 Fibromyalgia: Secondary | ICD-10-CM | POA: Diagnosis present

## 2021-06-08 DIAGNOSIS — N390 Urinary tract infection, site not specified: Secondary | ICD-10-CM | POA: Diagnosis present

## 2021-06-08 DIAGNOSIS — F17290 Nicotine dependence, other tobacco product, uncomplicated: Secondary | ICD-10-CM | POA: Diagnosis present

## 2021-06-08 DIAGNOSIS — E876 Hypokalemia: Secondary | ICD-10-CM | POA: Diagnosis present

## 2021-06-08 DIAGNOSIS — R739 Hyperglycemia, unspecified: Secondary | ICD-10-CM | POA: Diagnosis not present

## 2021-06-08 DIAGNOSIS — Z79899 Other long term (current) drug therapy: Secondary | ICD-10-CM | POA: Diagnosis not present

## 2021-06-08 DIAGNOSIS — Z9079 Acquired absence of other genital organ(s): Secondary | ICD-10-CM

## 2021-06-08 DIAGNOSIS — N10 Acute tubulo-interstitial nephritis: Secondary | ICD-10-CM | POA: Diagnosis not present

## 2021-06-08 DIAGNOSIS — R319 Hematuria, unspecified: Secondary | ICD-10-CM | POA: Diagnosis not present

## 2021-06-08 DIAGNOSIS — Z20822 Contact with and (suspected) exposure to covid-19: Secondary | ICD-10-CM | POA: Diagnosis not present

## 2021-06-08 DIAGNOSIS — M1990 Unspecified osteoarthritis, unspecified site: Secondary | ICD-10-CM | POA: Diagnosis present

## 2021-06-08 DIAGNOSIS — Z6826 Body mass index (BMI) 26.0-26.9, adult: Secondary | ICD-10-CM | POA: Diagnosis not present

## 2021-06-08 DIAGNOSIS — K219 Gastro-esophageal reflux disease without esophagitis: Secondary | ICD-10-CM | POA: Diagnosis not present

## 2021-06-08 DIAGNOSIS — Z885 Allergy status to narcotic agent status: Secondary | ICD-10-CM | POA: Diagnosis not present

## 2021-06-08 DIAGNOSIS — Z7982 Long term (current) use of aspirin: Secondary | ICD-10-CM | POA: Diagnosis not present

## 2021-06-08 DIAGNOSIS — G43909 Migraine, unspecified, not intractable, without status migrainosus: Secondary | ICD-10-CM | POA: Diagnosis present

## 2021-06-08 DIAGNOSIS — N2889 Other specified disorders of kidney and ureter: Secondary | ICD-10-CM | POA: Diagnosis not present

## 2021-06-08 DIAGNOSIS — E663 Overweight: Secondary | ICD-10-CM | POA: Diagnosis present

## 2021-06-08 DIAGNOSIS — Z886 Allergy status to analgesic agent status: Secondary | ICD-10-CM

## 2021-06-08 DIAGNOSIS — A419 Sepsis, unspecified organism: Secondary | ICD-10-CM | POA: Diagnosis present

## 2021-06-08 LAB — CBC
HCT: 39 % (ref 36.0–46.0)
Hemoglobin: 13.6 g/dL (ref 12.0–15.0)
MCH: 31.1 pg (ref 26.0–34.0)
MCHC: 34.9 g/dL (ref 30.0–36.0)
MCV: 89 fL (ref 80.0–100.0)
Platelets: 247 10*3/uL (ref 150–400)
RBC: 4.38 MIL/uL (ref 3.87–5.11)
RDW: 12.8 % (ref 11.5–15.5)
WBC: 27.7 10*3/uL — ABNORMAL HIGH (ref 4.0–10.5)
nRBC: 0 % (ref 0.0–0.2)

## 2021-06-08 LAB — COMPREHENSIVE METABOLIC PANEL
ALT: 25 U/L (ref 0–44)
AST: 23 U/L (ref 15–41)
Albumin: 3.1 g/dL — ABNORMAL LOW (ref 3.5–5.0)
Alkaline Phosphatase: 84 U/L (ref 38–126)
Anion gap: 13 (ref 5–15)
BUN: 20 mg/dL (ref 6–20)
CO2: 21 mmol/L — ABNORMAL LOW (ref 22–32)
Calcium: 8.4 mg/dL — ABNORMAL LOW (ref 8.9–10.3)
Chloride: 99 mmol/L (ref 98–111)
Creatinine, Ser: 1.29 mg/dL — ABNORMAL HIGH (ref 0.44–1.00)
GFR, Estimated: 56 mL/min — ABNORMAL LOW (ref >60)
Glucose, Bld: 160 mg/dL — ABNORMAL HIGH (ref 70–99)
Potassium: 3.5 mmol/L (ref 3.5–5.1)
Sodium: 133 mmol/L — ABNORMAL LOW (ref 135–145)
Total Bilirubin: 0.8 mg/dL (ref 0.3–1.2)
Total Protein: 6.9 g/dL (ref 6.5–8.1)

## 2021-06-08 LAB — URINALYSIS, ROUTINE W REFLEX MICROSCOPIC
Bilirubin Urine: NEGATIVE
Glucose, UA: NEGATIVE mg/dL
Ketones, ur: NEGATIVE mg/dL
Nitrite: NEGATIVE
Protein, ur: 100 mg/dL — AB
RBC / HPF: >50 RBC/hpf — ABNORMAL HIGH (ref 0–5)
Specific Gravity, Urine: 1.015 (ref 1.005–1.030)
WBC, UA: >50 WBC/hpf — ABNORMAL HIGH (ref 0–5)
pH: 5 (ref 5.0–8.0)

## 2021-06-08 LAB — PROTIME-INR
INR: 1.2 (ref 0.8–1.2)
Prothrombin Time: 14.7 seconds (ref 11.4–15.2)

## 2021-06-08 LAB — TROPONIN I (HIGH SENSITIVITY): Troponin I (High Sensitivity): 10 ng/L (ref <18)

## 2021-06-08 LAB — PROCALCITONIN: Procalcitonin: 5.33 ng/mL

## 2021-06-08 LAB — LACTIC ACID, PLASMA
Lactic Acid, Venous: 2 mmol/L (ref 0.5–1.9)
Lactic Acid, Venous: 1.4 mmol/L (ref 0.5–1.9)

## 2021-06-08 LAB — APTT: aPTT: 33 seconds (ref 24–36)

## 2021-06-08 LAB — RESP PANEL BY RT-PCR (FLU A&B, COVID) ARPGX2
Influenza A by PCR: NEGATIVE
Influenza B by PCR: NEGATIVE
SARS Coronavirus 2 by RT PCR: NEGATIVE

## 2021-06-08 LAB — I-STAT BETA HCG BLOOD, ED (MC, WL, AP ONLY): I-stat hCG, quantitative: <5 m[IU]/mL (ref <5)

## 2021-06-08 LAB — HEMOGLOBIN A1C
Hgb A1c MFr Bld: 5.1 % (ref 4.8–5.6)
Mean Plasma Glucose: 99.67 mg/dL

## 2021-06-08 LAB — LIPASE, BLOOD: Lipase: 27 U/L (ref 11–51)

## 2021-06-08 MED ORDER — ENOXAPARIN SODIUM 40 MG/0.4ML IJ SOSY
40.0000 mg | PREFILLED_SYRINGE | INTRAMUSCULAR | Status: DC
  Administered 2021-06-08 – 2021-06-12 (×5): 40 mg via SUBCUTANEOUS
  Filled 2021-06-08 (×4): qty 0.4

## 2021-06-08 MED ORDER — ZOLPIDEM TARTRATE 5 MG PO TABS
10.0000 mg | ORAL_TABLET | Freq: Every evening | ORAL | Status: DC | PRN
  Administered 2021-06-08 – 2021-06-12 (×5): 10 mg via ORAL
  Filled 2021-06-08 (×5): qty 2

## 2021-06-08 MED ORDER — ONDANSETRON HCL 4 MG/2ML IJ SOLN
4.0000 mg | Freq: Four times a day (QID) | INTRAMUSCULAR | Status: DC | PRN
  Administered 2021-06-08 – 2021-06-12 (×4): 4 mg via INTRAVENOUS
  Filled 2021-06-08 (×5): qty 2

## 2021-06-08 MED ORDER — CEFEPIME HCL 2 G IV SOLR
2.0000 g | Freq: Three times a day (TID) | INTRAVENOUS | Status: DC
  Administered 2021-06-08: 2 g via INTRAVENOUS
  Filled 2021-06-08: qty 12.5

## 2021-06-08 MED ORDER — ACETAMINOPHEN 325 MG PO TABS
650.0000 mg | ORAL_TABLET | Freq: Four times a day (QID) | ORAL | Status: DC | PRN
  Administered 2021-06-08 – 2021-06-09 (×3): 650 mg via ORAL
  Filled 2021-06-08 (×3): qty 2

## 2021-06-08 MED ORDER — ACETAMINOPHEN 650 MG RE SUPP: 650.0000 mg | Freq: Four times a day (QID) | RECTAL | Status: DC | PRN

## 2021-06-08 MED ORDER — HYDROXYZINE HCL 25 MG PO TABS
25.0000 mg | ORAL_TABLET | Freq: Two times a day (BID) | ORAL | Status: DC | PRN
  Administered 2021-06-09 – 2021-06-13 (×4): 25 mg via ORAL
  Filled 2021-06-08 (×4): qty 1

## 2021-06-08 MED ORDER — PROMETHAZINE HCL 25 MG RE SUPP
25.0000 mg | Freq: Four times a day (QID) | RECTAL | Status: DC | PRN
Start: 2021-06-08 — End: 2021-06-13

## 2021-06-08 MED ORDER — HYDROMORPHONE HCL 1 MG/ML IJ SOLN
0.5000 mg | Freq: Once | INTRAMUSCULAR | Status: AC | PRN
  Administered 2021-06-08: 0.5 mg via INTRAVENOUS
  Filled 2021-06-08: qty 1

## 2021-06-08 MED ORDER — LACTATED RINGERS IV BOLUS (SEPSIS)
1000.0000 mL | Freq: Once | INTRAVENOUS | Status: AC
  Administered 2021-06-08: 1000 mL via INTRAVENOUS

## 2021-06-08 MED ORDER — LACTATED RINGERS IV BOLUS (SEPSIS)
250.0000 mL | Freq: Once | INTRAVENOUS | Status: AC
  Administered 2021-06-08: 250 mL via INTRAVENOUS

## 2021-06-08 MED ORDER — SODIUM CHLORIDE 0.9% FLUSH
3.0000 mL | Freq: Two times a day (BID) | INTRAVENOUS | Status: DC
  Administered 2021-06-09 – 2021-06-12 (×3): 3 mL via INTRAVENOUS

## 2021-06-08 MED ORDER — SODIUM CHLORIDE 0.9 % IV SOLN
2.0000 g | INTRAVENOUS | Status: DC
  Administered 2021-06-08 – 2021-06-12 (×5): 2 g via INTRAVENOUS
  Filled 2021-06-08 (×5): qty 20

## 2021-06-08 MED ORDER — VANCOMYCIN HCL 1500 MG/300ML IV SOLN
1500.0000 mg | Freq: Once | INTRAVENOUS | Status: AC
  Administered 2021-06-08: 1500 mg via INTRAVENOUS
  Filled 2021-06-08: qty 300

## 2021-06-08 MED ORDER — GABAPENTIN 100 MG PO CAPS
100.0000 mg | ORAL_CAPSULE | Freq: Two times a day (BID) | ORAL | Status: DC
  Administered 2021-06-08 – 2021-06-13 (×11): 100 mg via ORAL
  Filled 2021-06-08 (×11): qty 1

## 2021-06-08 MED ORDER — ONDANSETRON HCL 4 MG PO TABS
4.0000 mg | ORAL_TABLET | Freq: Four times a day (QID) | ORAL | Status: DC | PRN
  Administered 2021-06-13: 4 mg via ORAL
  Filled 2021-06-08 (×2): qty 1

## 2021-06-08 MED ORDER — ONDANSETRON HCL 4 MG/2ML IJ SOLN
4.0000 mg | Freq: Once | INTRAMUSCULAR | Status: AC
  Administered 2021-06-08: 4 mg via INTRAVENOUS
  Filled 2021-06-08: qty 2

## 2021-06-08 MED ORDER — SERTRALINE HCL 100 MG PO TABS
100.0000 mg | ORAL_TABLET | Freq: Every evening | ORAL | Status: DC
  Administered 2021-06-08 – 2021-06-12 (×5): 100 mg via ORAL
  Filled 2021-06-08 (×6): qty 1

## 2021-06-08 MED ORDER — OXYCODONE HCL 5 MG PO TABS
5.0000 mg | ORAL_TABLET | ORAL | Status: DC | PRN
  Administered 2021-06-08 – 2021-06-10 (×8): 5 mg via ORAL
  Filled 2021-06-08 (×11): qty 1

## 2021-06-08 MED ORDER — IOHEXOL 300 MG/ML  SOLN
100.0000 mL | Freq: Once | INTRAMUSCULAR | Status: AC | PRN
  Administered 2021-06-08: 100 mL via INTRAVENOUS

## 2021-06-08 MED ORDER — LACTATED RINGERS IV SOLN: INTRAVENOUS | Status: DC

## 2021-06-08 MED ORDER — MELATONIN 5 MG PO TABS
5.0000 mg | ORAL_TABLET | Freq: Every evening | ORAL | Status: DC
  Administered 2021-06-08 – 2021-06-12 (×5): 5 mg via ORAL
  Filled 2021-06-08 (×7): qty 1

## 2021-06-08 NOTE — ED Notes (Signed)
Sent Dr. Antionette Char message reference pt current temp

## 2021-06-08 NOTE — ED Notes (Signed)
Admit provider paged reference current temp

## 2021-06-08 NOTE — H&P (Signed)
History and Physical    Patient: Claire Caldwell [id number] DOB: 03/13/1985 DOA: 06/08/2021 DOS: the patient was seen and examined on 06/08/2021 PCP: Nolene Ebbs, MD  Patient coming from: Home - lives with fiance and 3 kids; NOK: Anabel Bene, [phone]   Chief Complaint: Abdominal pain  HPI: Claire Caldwell is a 36 y.o. female with medical history significant of HTN and fibromyalgia presenting with abdominal pain.  She reports that she started with high fever (almost 103) with suprapubic pain and R flank pain.  She has h/o UTI and so suspected that.  She has chronic hematuria and proteinuria but has not been definitively diagnosed with Alport Syndrome; she has not been followed recently by nephrology.    ER Course:  Sepsis from pyelo.  1 month of intermittent urinary symptoms.  Fever Thurs-Fri.  Today with chest tightness, SOB, R-sided flank pain.  Abnormal [place].  +pyelo on CT.  WBC 27.     Review of Systems: As mentioned in the history of present illness. All other systems reviewed and are negative. Past Medical History:  Diagnosis Date   Arthritis    lower back   Fibromyalgia    GERD (gastroesophageal reflux disease)    prilosec not reordered-  not taking x approx 3 weeks   Headache(784.0)    migraines   Hematuria    chronic   Hidradenitis 10/2011   right groin   Hypertension    Simple cyst of kidney    states normal kidney function, except for hematuria   Past Surgical History:  Procedure Laterality Date   DILATION AND CURETTAGE OF UTERUS  03/13/2011   EAR CYST EXCISION  2007   bil ears   HYDRADENITIS EXCISION  10/30/2011   Procedure: EXCISION HYDRADENITIS GROIN;  Surgeon: Imogene Burn. Georgette Dover, MD;  Location: [facility];  Service: General;  Laterality: Bilateral;   HYSTEROSCOPY W/ ENDOMETRIAL ABLATION  03/13/2011   INGUINAL HIDRADENITIS EXCISION  09/30/2010   bilateral   IRRIGATION AND DEBRIDEMENT SEBACEOUS CYST  03/01/2009   exc. 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 She has a 4.00 pack-year smoking history. She has never used smokeless tobacco. She reports that she does not drink alcohol and does not use drugs.  Allergies  Allergen Reactions   Mobic [Meloxicam] Hives and Itching   Motrin [Ibuprofen] Hives, Itching and Rash    Pt seems to be able to take children's Motrin   Nsaids Hives, Itching and Rash    Pt seems to be able to take children's Motrin.   Ultram [Tramadol] Hives, Itching and Other (See Comments)    Headache    Family History  Problem Relation Age of Onset   Endometriosis Mother  Cancer Maternal Aunt        breast   Endometriosis Maternal Aunt    Cancer Maternal Uncle        lung   Endometriosis Maternal Grandmother     Prior to Admission medications   Medication Sig Start Date End Date Taking? Authorizing Provider  amLODipine (NORVASC) 5 MG tablet Take 1 tablet (5 mg total) by mouth daily. 08/22/19   Jaynee Eagles, PA-C  aspirin-acetaminophen-caffeine (EXCEDRIN MIGRAINE) [phone] MG tablet Take 4 tablets by mouth every 6 (six) hours as needed for headache or migraine. Patient not taking: Reported on 09/24/2020    [provider]  cetirizine (ZYRTEC ALLERGY) 10 MG tablet Take 1 tablet (10 mg total) by mouth daily. 08/22/19   Jaynee Eagles,  PA-C  loperamide (IMODIUM) 2 MG capsule Take 1 capsule (2 mg total) by mouth daily as needed for diarrhea or loose stools. 08/22/19   Jaynee Eagles, PA-C  LORazepam (ATIVAN) 1 MG tablet Take 1 tablet (1 mg total) by mouth every 8 (eight) hours as needed for anxiety. 07/05/20   Ripley Fraise, MD  sertraline (ZOLOFT) 100 MG tablet Take 100 mg by mouth every evening. 07/19/19   [provider]  zolpidem (AMBIEN) 10 MG tablet Take 10 mg by mouth at bedtime as needed for sleep. 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 No LE edema.  Respiratory:   CTA bilaterally with no wheezes/rales/rhonchi.  Normal respiratory effort. Abdomen:  soft, +suprapubic TTP and RLQ/R flank TTP Back:   R CVAT Skin:  no rash or induration seen on limited exam Musculoskeletal:  grossly normal tone BUE/BLE, good ROM, no bony abnormality Psychiatric:  blunted mood and affect, speech fluent and appropriate, AOx3 Neurologic:  CN 2-12 grossly intact, moves all extremities in coordinated fashion   Radiological Exams on Admission: Independently reviewed - see discussion in A/P where applicable  DG Chest 1 View  Result Date: 06/08/2021 CLINICAL DATA:  Fever EXAM: CHEST  1 VIEW COMPARISON:  07/04/2020 FINDINGS: The heart size and mediastinal contours are within normal limits. Both lungs are clear. The visualized skeletal structures are unremarkable. IMPRESSION: No active disease.  Electronically Signed   By: Davina Poke D.O.   On: 06/08/2021 10:54   CT ABDOMEN PELVIS W CONTRAST  Result Date: 06/08/2021 CLINICAL DATA:  Right flank and abdominal pain for 4 days. Fevers. Abdominal distention and nausea. Sepsis. EXAM: CT ABDOMEN AND PELVIS WITH CONTRAST TECHNIQUE: Multidetector CT imaging of the abdomen and pelvis was performed using the standard protocol following bolus administration of intravenous contrast. RADIATION DOSE REDUCTION: This exam was performed according to the departmental dose-optimization program which includes automated exposure control, adjustment of the mA and/or kV according to patient size and/or use of iterative reconstruction technique. CONTRAST:  176mL OMNIPAQUE IOHEXOL 300 MG/ML  SOLN COMPARISON:  Noncontrast CT on 05/05/2006  FINDINGS: Lower Chest: No acute findings. Hepatobiliary: No hepatic masses identified. Gallbladder is unremarkable. No evidence of biliary ductal dilatation. Pancreas:  No mass or inflammatory changes. Spleen: Within normal limits in size and appearance. Adrenals/Urinary Tract: Normal adrenal glands. No evidence of ureteral calculi or hydronephrosis. Several benign-appearing renal cysts are noted. Diffuse right renal swelling is seen with multiple small ill-defined areas of decreased contrast enhancement and right perinephric soft tissue stranding. These findings are consistent with pyelonephritis. A 3.3 cm intermediate attenuation lesion is seen in the posterior midpole of the right kidney, which could represent a complex cyst, mass, or less likely abscess. Stomach/Bowel: No evidence of obstruction, inflammatory process or abnormal fluid collections. Vascular/Lymphatic: No pathologically enlarged lymph nodes. No acute vascular findings. Aortic atherosclerotic calcification incidentally noted. Reproductive: No mass or other significant abnormality. Left-sided Essure micro-insert seen at the left uterine tubal junction. Other:  None.  Musculoskeletal:  No suspicious bone lesions identified. IMPRESSION: Right-sided pyelonephritis. No evidence of ureteral calculi or hydronephrosis. 3.3 cm indeterminate right renal lesion, with differential diagnosis including complex cyst, mass, or less likely abscess. Recommendimaging follow-up with renal protocol abdomen MRI or CT without and with contrast in approximately 1 month. Aortic Atherosclerosis (ICD10-I70.0). Electronically Signed   By: Danae Orleans M.D.   On: 06/08/2021 11:00    EKG: Independently reviewed.  NSR with rate 69; nonspecific ST changes with no evidence of acute ischemia   Labs on Admission: I have personally reviewed the available labs and imaging studies at the time of the admission.  Pertinent labs:    Glucose 160 BUN 20/Creatinine 1.29/GFR 56 Albumin 3.1 Lactate 2 WBC 27.7 INR 1.2 HCG <5 UA: large Hgb, large LE, 100 protein, >50 WBC and RBC   Assessment and Plan: Principal Problem:   Sepsis secondary to UTI [facility]) Active Problems:   Fibromyalgia   Essential hypertension   Hematuria of undiagnosed cause   Hyperglycemia    Sepsis from pyelo -Sepsis indicates life-threatening organ dysfunction with mortality >10%, caused by dysregulation to host response.   -SIRS criteria in this patient includes: Leukocytosis, fever (at home), tachycardia  -Patient has evidence of acute organ failure with elevated lactate >2; recurrent hypotension (SBP < 90 or MAP < 65 x 2 readings) that is not easily explained by another condition. -While awaiting blood cultures, this appears to be a preseptic condition. -Sepsis protocol initiated -Suspected source is UTI/R pyelo -Blood and urine cultures pending -Will admit due to: hemodynamic instability -Treat with IV Rocephin for presumed urinary source -Will add HIV -Will trend lactate to ensure improvement -Will order procalcitonin level.   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 -Patient has persistent large hematuria and periodic proteinuria -renal biopsy in 2012 was non-diagnostic for Alport's -Imaging today also showed a 3.3 cm indeterminate R renal lesion -Suggest outpatient referral back to nephrology for further evaluation of lesion (recommended to have MRI or CT in 1 month) as well as possible Alport's  Hyperglycemia -Elevated glucose on several occasions -Will check A1c   Advance Care Planning:   Code Status: Full Code   Consults: None  DVT Prophylaxis: Lovenox  Family Communication: None present; she is capable of communicating with family at this time  Severity of Illness: The appropriate patient status for this patient is INPATIENT. Inpatient status is judged to be reasonable and necessary in order to provide the required intensity of service to ensure  the patient's safety. The patient's presenting symptoms, physical exam findings, and initial radiographic and laboratory data in the context of their chronic comorbidities is felt to place them at high risk for further clinical deterioration. Furthermore, it is not anticipated that the patient will be medically stable for discharge from the hospital within 2 midnights of admission.   * I certify that at the point of admission it is my clinical judgment that the patient will require inpatient hospital care spanning beyond 2 midnights from the point of admission due to high intensity of service, high risk for further deterioration and high frequency of surveillance required.*  Author: Karmen Bongo, MD 06/08/2021 1:03 PM  For on call review [email or web address].

## 2021-06-08 NOTE — Sepsis Progress Note (Signed)
Sepsis protocol is being followed by [facility]. 

## 2021-06-08 NOTE — ED Notes (Signed)
Received verbal report from Julie B RN at this time 

## 2021-06-08 NOTE — Progress Notes (Signed)
Pharmacy Antibiotic Note  Claire Caldwell is a 36 y.o. female for which pharmacy has been consulted for vancomycin dosing for sepsis.  Patient with a history of arthritis, fibromyalgia, GERD, hematuria (chronic), hidradenitis, HTN. Patient presenting with R flank pain and R sided abd pain since Thursday and fever at home.  SCr 1.29 - above baseline WBC 27.7; T 98.6 F; HR 80; RR 15  Plan: Cefepime per MD - renal function may be bordeline for adjustment to q12h once height and weight documented for this encounter - will follow Vancomycin 1500 mg q24hr (eAUC 508.5) unless change in renal function (using IBW 63.9 and SCr 1.29; Vd 0.72) - SCr is up now - will likely require dose adjustment depending on renal function trend Trend WBC, Fever, Renal function, & Clinical course F/u cultures, clinical course, WBC, fever De-escalate when able     Temp (24hrs), Avg:98.9 F (37.2 C), Min:98.6 F (37 C), Max:99.2 F (37.3 C)  Recent Labs  Lab 06/08/21 0900  WBC 27.7*  CREATININE 1.29*    CrCl cannot be calculated (Unknown ideal weight.).    Allergies  Allergen Reactions   Ibuprofen Itching and Rash   Meloxicam Itching   Tramadol Hives and Itching    HEADACHE    Antimicrobials this admission: cefepime 6/4 >>  vancomycin 6/4 >>  Microbiology results: Pending  Thank you for allowing pharmacy to be a part of this patient's care.  Delmar Landau, PharmD, BCPS 06/08/2021 10:13 AM ED Clinical Pharmacist -  [phone]

## 2021-06-08 NOTE — ED Triage Notes (Signed)
Pt reports R flank pain and R sided abd pain since Thursday.  Friday she started having fevers with Tmax 102.9.  Taking Children's Motrin for fevers.  Last taken at 7am.  C/o generalized abd pain since this morning with abd distention and nausea.  1 episode of diarrhea yesterday.  Frequent urination with small amounts of dark urine.

## 2021-06-08 NOTE — ED Provider Notes (Signed)
[facility] EMERGENCY DEPARTMENT Provider Note   CSN: [id number] Arrival date & time: 06/08/21  7829     History  Chief Complaint  Patient presents with   Abdominal Pain    ALAIRA LEVEL is a 36 y.o. female.   Abdominal Pain Associated symptoms: chills, fatigue, fever, nausea and shortness of breath   Associated symptoms: no chest pain, no constipation, no cough, no diarrhea, no dysuria, no hematuria, no sore throat, no vaginal bleeding and no vomiting    36 year old female presents emergency department with complaints of abdominal pain.  She states that she has had 1 month history of "bladder pressure in my bladder" with intermittent pink/red urine as well as malodor.  She deferred coming to the emergency department because she "could not miss work or also fired me."  This past Wednesday on 06/04/2021, she began to have right-sided back/flank pain with continued urinary symptoms.  On Friday 06/06/2021 she began to develop fever around 101-102.9 taken orally at home.  She tried to control the fever with Tylenol and Motrin with some success.  She continued to feel worse which prompted her visit to the emergency department today.  Associated symptoms today include shortness of breath, chest pain, nausea.  Chest pain is described as midsternal chest tightness/heaviness that began this morning along with shortness of breath and nausea.  She took Children's Motrin and Tylenol this morning around 7 AM for her fever.  She denies vomiting, vaginal discharge/bleeding, change in bowel habits.  Confirms e-cigarette, cigarettes, marijuana use; denies IV drug/illicit drug use.  Past Medical History:  Diagnosis Date   Arthritis    lower back   Fibromyalgia    GERD (gastroesophageal reflux disease)    prilosec not reordered-  not taking x approx 3 weeks   Headache(784.0)    migraines   Hematuria    chronic   Hidradenitis 10/2011   right groin   Hypertension    Simple cyst of  kidney    states normal kidney function, except for hematuria   Past Surgical History:  Procedure Laterality Date   DILATION AND CURETTAGE OF UTERUS  03/13/2011   EAR CYST EXCISION  2007   bil ears   HYDRADENITIS EXCISION  10/30/2011   Procedure: EXCISION HYDRADENITIS GROIN;  Surgeon: Wilmon Arms. Corliss Skains, MD;  Location: [facility];  Service: General;  Laterality: Bilateral;   HYSTEROSCOPY W/ ENDOMETRIAL ABLATION  03/13/2011   INGUINAL HIDRADENITIS EXCISION  09/30/2010   bilateral   IRRIGATION AND DEBRIDEMENT SEBACEOUS CYST  03/01/2009   exc. sebaceous cyst x 4 bilateral groin   LAPAROSCOPY N/A 10/28/2012   Procedure: LAPAROSCOPY DIAGNOSTIC; PERIOTONEAL BIOPSIES;  Surgeon: Antionette Char, MD;  Location: [facility];  Service: Gynecology;  Laterality: N/A;   SALPINGECTOMY  08/25/2006   laparoscopic - right   TONSILLECTOMY     & adenoids as a child   TUBAL LIGATION  09/05/2010   Procedure: ESSURE TUBAL STERILIZATION;  Surgeon: Roseanna Rainbow, MD;  Location: [facility];  Service: Gynecology;  Laterality: Left;   TUMOR REMOVAL  2009   left knee   TUMOR REMOVAL  2010   left foot   TUMOR REMOVAL  2011   right salivary gland   TUMOR REMOVAL  2011   left side of head    Home Medications Prior to Admission medications   Medication Sig Start Date End Date Taking? Authorizing Provider  amLODipine (NORVASC) 10 MG tablet Take 10 mg by mouth daily. 05/21/21  Yes  [provider]  aspirin-acetaminophen-caffeine (EXCEDRIN MIGRAINE) [phone] MG tablet Take 2 tablets by mouth every 6 (six) hours as needed for headache or migraine.   Yes [provider]  [employer] MELATONIN 5 MG TABS Take 5 mg by mouth every evening. 05/24/21  Yes [provider]  gabapentin (NEURONTIN) 100 MG capsule Take 100 mg by mouth 2 (two) times daily. 05/21/21  Yes [provider]  hydrOXYzine (ATARAX) 25 MG tablet Take 25 mg by mouth 2 (two) times daily as needed for anxiety (panic  attacks). 05/21/21  Yes [provider]  ibuprofen (ADVIL) 100 MG/5ML suspension Take 300-400 mg by mouth every 8 (eight) hours as needed for mild pain (pain).   Yes [provider]  sertraline (ZOLOFT) 100 MG tablet Take 100 mg by mouth every evening. 07/19/19  Yes [provider]  Vitamin D, Ergocalciferol, (DRISDOL) 1.25 MG (50000 UNIT) CAPS capsule Take 50,000 Units by mouth every Wednesday. 05/21/21  Yes [provider]  zolpidem (AMBIEN) 10 MG tablet Take 10 mg by mouth at bedtime as needed for sleep. 08/01/19  Yes [provider]  amLODipine (NORVASC) 5 MG tablet Take 1 tablet (5 mg total) by mouth daily. Patient not taking: Reported on 06/08/2021 08/22/19   Wallis Bamberg, PA-C  cetirizine (ZYRTEC ALLERGY) 10 MG tablet Take 1 tablet (10 mg total) by mouth daily. Patient not taking: Reported on 06/08/2021 08/22/19   Wallis Bamberg, PA-C      Allergies    Mobic [meloxicam], Motrin [ibuprofen], Nsaids, and Ultram [tramadol]    Review of Systems   Review of Systems  Constitutional:  Positive for appetite change, chills, fatigue and fever. Negative for diaphoresis.  HENT:  Negative for ear pain and sore throat.   Eyes:  Negative for pain and visual disturbance.  Respiratory:  Positive for chest tightness and shortness of breath. Negative for cough and wheezing.   Cardiovascular:  Negative for chest pain and palpitations.  Gastrointestinal:  Positive for abdominal pain and nausea. Negative for blood in stool, constipation, diarrhea and vomiting.  Genitourinary:  Positive for difficulty urinating and flank pain. Negative for dysuria, hematuria, pelvic pain, vaginal bleeding and vaginal pain.  Musculoskeletal:  Negative for arthralgias, back pain and neck stiffness.  Skin:  Negative for color change, rash and wound.  Neurological:  Negative for seizures and syncope.  All other systems reviewed and are negative.  Physical Exam Updated Vital Signs BP 95/66 (BP  Location: Right Arm)   Pulse 66   Temp 98.6 F (37 C) (Oral)   Resp 15   SpO2 98%  Physical Exam Vitals and nursing note reviewed.  Constitutional:      General: She is not in acute distress.    Appearance: She is well-developed. She is ill-appearing and toxic-appearing. She is not diaphoretic.  HENT:     Head: Normocephalic and atraumatic.     Mouth/Throat:     Mouth: Mucous membranes are moist.     Pharynx: Oropharynx is clear.  Eyes:     Extraocular Movements: Extraocular movements intact.  Cardiovascular:     Rate and Rhythm: Normal rate and regular rhythm.  Pulmonary:     Effort: Pulmonary effort is normal.     Breath sounds: Normal breath sounds. No wheezing or rales.  Abdominal:     General: Abdomen is flat. Bowel sounds are increased.     Palpations: Abdomen is rigid.     Tenderness: There is abdominal tenderness in the right upper quadrant, right  lower quadrant and suprapubic area. There is right CVA tenderness and guarding. There is no left CVA tenderness or rebound. Negative signs include Murphy's sign.  Skin:    Capillary Refill: Capillary refill takes less than 2 seconds.     Comments: Skin is hot/warm to the touch.  No diaphoresis noted.  No observable rash.  Neurological:     General: No focal deficit present.     Mental Status: She is alert and oriented to person, place, and time.  Psychiatric:        Mood and Affect: Mood normal.        Behavior: Behavior normal.    ED Results / Procedures / Treatments   Labs (all labs ordered are listed, but only abnormal results are displayed) Labs Reviewed  COMPREHENSIVE METABOLIC PANEL - Abnormal; Notable for the following components:      Result Value   Sodium 133 (*)    CO2 21 (*)    Glucose, Bld 160 (*)    Creatinine, Ser 1.29 (*)    Calcium 8.4 (*)    Albumin 3.1 (*)    GFR, Estimated 56 (*)    All other components within normal limits  CBC - Abnormal; Notable for the following components:   WBC 27.7 (*)     All other components within normal limits  URINALYSIS, ROUTINE W REFLEX MICROSCOPIC - Abnormal; Notable for the following components:   Color, Urine AMBER (*)    APPearance CLOUDY (*)    Hgb urine dipstick LARGE (*)    Protein, ur 100 (*)    Leukocytes,Ua LARGE (*)    RBC / HPF >50 (*)    WBC, UA >50 (*)    Bacteria, UA FEW (*)    Non Squamous Epithelial 0-5 (*)    All other components within normal limits  LACTIC ACID, PLASMA - Abnormal; Notable for the following components:   Lactic Acid, Venous 2.0 (*)    All other components within normal limits  RESP PANEL BY RT-PCR (FLU A&B, COVID) ARPGX2  CULTURE, BLOOD (ROUTINE X 2)  CULTURE, BLOOD (ROUTINE X 2)  URINE CULTURE  LIPASE, BLOOD  PROTIME-INR  APTT  LACTIC ACID, PLASMA  I-STAT BETA HCG BLOOD, ED ([facility], [facility], [facility] ONLY)  TROPONIN I (HIGH SENSITIVITY)  TROPONIN I (HIGH SENSITIVITY)    EKG EKG Interpretation  Date/Time:  Sunday June 08 2021 10:47:08 EDT Ventricular Rate:  69 PR Interval:  152 QRS Duration: 98 QT Interval:  362 QTC Calculation: 387 R Axis:   88 Text Interpretation: Normal sinus rhythm Right atrial enlargement Nonspecific T wave abnormality Abnormal ECG When compared with ECG of 04-Jul-2020 20:58, PREVIOUS ECG IS PRESENT Confirmed by Kristine Royal [phone]) on 06/08/2021 10:52:44 AM  Radiology DG Chest 1 View  Result Date: 06/08/2021 CLINICAL DATA:  Fever EXAM: CHEST  1 VIEW COMPARISON:  07/04/2020 FINDINGS: The heart size and mediastinal contours are within normal limits. Both lungs are clear. The visualized skeletal structures are unremarkable. IMPRESSION: No active disease. Electronically Signed   By: Duanne Guess D.O.   On: 06/08/2021 10:54   CT ABDOMEN PELVIS W CONTRAST  Result Date: 06/08/2021 CLINICAL DATA:  Right flank and abdominal pain for 4 days. Fevers. Abdominal distention and nausea. Sepsis. EXAM: CT ABDOMEN AND PELVIS WITH CONTRAST TECHNIQUE: Multidetector CT imaging of the abdomen and pelvis  was performed using the standard protocol following bolus administration of intravenous contrast. RADIATION DOSE REDUCTION: This exam was performed according to the departmental dose-optimization program which  includes automated exposure control, adjustment of the mA and/or kV according to patient size and/or use of iterative reconstruction technique. CONTRAST:  100mL OMNIPAQUE IOHEXOL 300 MG/ML  SOLN COMPARISON:  Noncontrast CT on 05/05/2006 FINDINGS: Lower Chest: No acute findings. Hepatobiliary: No hepatic masses identified. Gallbladder is unremarkable. No evidence of biliary ductal dilatation. Pancreas:  No mass or inflammatory changes. Spleen: Within normal limits in size and appearance. Adrenals/Urinary Tract: Normal adrenal glands. No evidence of ureteral calculi or hydronephrosis. Several benign-appearing renal cysts are noted. Diffuse right renal swelling is seen with multiple small ill-defined areas of decreased contrast enhancement and right perinephric soft tissue stranding. These findings are consistent with pyelonephritis. A 3.3 cm intermediate attenuation lesion is seen in the posterior midpole of the right kidney, which could represent a complex cyst, mass, or less likely abscess. Stomach/Bowel: No evidence of obstruction, inflammatory process or abnormal fluid collections. Vascular/Lymphatic: No pathologically enlarged lymph nodes. No acute vascular findings. Aortic atherosclerotic calcification incidentally noted. Reproductive: No mass or other significant abnormality. Left-sided Essure micro-insert seen at the left uterine tubal junction. Other:  None. Musculoskeletal:  No suspicious bone lesions identified. IMPRESSION: Right-sided pyelonephritis. No evidence of ureteral calculi or hydronephrosis. 3.3 cm indeterminate right renal lesion, with differential diagnosis including complex cyst, mass, or less likely abscess. Recommendimaging follow-up with renal protocol abdomen MRI or CT without and  with contrast in approximately 1 month. Aortic Atherosclerosis (ICD10-I70.0). Electronically Signed   By: Danae OrleansJohn A Stahl M.D.   On: 06/08/2021 11:00    Procedures Procedures    Medications Ordered in ED Medications  lactated ringers infusion (has no administration in time range)  ceFEPIme (MAXIPIME) 2 g in sodium chloride 0.9 % 100 mL IVPB (0 g Intravenous Stopped 06/08/21 1119)  vancomycin (VANCOREADY) IVPB 1500 mg/300 mL (1,500 mg Intravenous New Bag/Given 06/08/21 1125)  lactated ringers bolus 1,000 mL (0 mLs Intravenous Stopped 06/08/21 1119)    And  lactated ringers bolus 1,000 mL (0 mLs Intravenous Stopped 06/08/21 1214)    And  lactated ringers bolus 250 mL (0 mLs Intravenous Stopped 06/08/21 1238)  ondansetron (ZOFRAN) injection 4 mg (4 mg Intravenous Given 06/08/21 1026)  iohexol (OMNIPAQUE) 300 MG/ML solution 100 mL (100 mLs Intravenous Contrast Given 06/08/21 1032)    ED Course/ Medical Decision Making/ A&P                           Medical Decision Making Amount and/or Complexity of Data Reviewed Labs: ordered. Radiology: ordered. ECG/medicine tests: ordered.  Risk Prescription drug management.   This patient presents to the ED for concern of abdominal pain, this involves an extensive number of treatment options, and is a complaint that carries with it a high risk of complications and morbidity.  The differential diagnosis includes The causes of generalized abdominal pain include but are not limited to AAA, mesenteric ischemia, appendicitis, diverticulitis, DKA, gastritis, gastroenteritis, AMI, nephrolithiasis, pancreatitis, peritonitis, adrenal insufficiency,lead poisoning, iron toxicity, intestinal ischemia, constipation, UTI,SBO/LBO, splenic rupture, biliary disease, IBD, IBS, PUD, or hepatitis. Ectopic pregnancy, ovarian torsion, PID.   Co morbidities that complicate the patient evaluation  Fibromyalgia, GERD, headaches, hematuria, hidradenitis, hypertension, cyst of  kidney   Additional history obtained:  Additional history obtained from CMP from 07/26/2020.  Creatinine of 0.87 and BUN of 12 and GFR of 90 External records from outside source obtained and reviewed including see above   Lab Tests:  I Ordered, and personally interpreted labs.  The pertinent results include:  UA significant for large hemoglobin, protein 100, leukocytes large, RBC greater than 50, WBC greater than 50 bacteria few CBC with WBC of 27.7 CMP showing creatinine 1.29, GFR 56 Lactic acid 2.0   Imaging Studies ordered:  I ordered imaging studies including chest x-ray, CT abdomen pelvis I independently visualized and interpreted imaging which showed  Chest x-ray: No acute cardiopulmonary disease CT abdomen pelvis: Right-sided pyelonephritis.  No evidence of ureteral calculi or hydronephrosis.  3.3 cm indeterminate right renal lesion with differential diagnosis including complex cyst, mass, or less likely abscess.  Aortic atherosclerosis. I agree with the radiologist interpretation  Cardiac Monitoring: / EKG:  The patient was maintained on a cardiac monitor.  I personally viewed and interpreted the cardiac monitored which showed an underlying rhythm of: Sinus rhythm   Consultations Obtained:  I requested consultation with the hospitalist Dr. Ophelia Charter,  and discussed lab and imaging findings as well as pertinent plan - they recommend: Admission and assuming further treatment/care of patient   Problem List / ED Course / Critical interventions / Medication management  Pyelonephritis/sepsis I ordered medication including  2.25 L lactated Ringer's for fluid bolus Zofran for nausea Cefepime vancomycin for empiric broad-spectrum antibiotic coverage   Reevaluation of the patient after these medicines showed that the patient improved I have reviewed the patients home medicines and have made adjustments as needed   Social Determinants of Health:  Cigarette use, e-cigarette  use, marijuana use   Test / Admission - Considered:  Sepsis/Pyelonephritis Vitals signs significant for hypertension with a blood pressure 77/53 initially.  Patient responded with fluid bolus blood pressure 95/66.  Laboratory/imaging studies significant for: Pyelonephritis with multiple renal cysts/masses/abscesses.  UA significant for evidence of infection.  CMP significant for AKI, sepsis with initial tachycardia rate 121 and hypertension with elevated white count confirmed infection source of kidney.  Patient responded well to fluid resuscitation.  Admission deemed necessary secondary to patient status as well as disease process.  Empiric antibiotics started.  Consulted hospital medicine who is agreeable to assume further treatment/care of patient.  Treatment plan was discussed with the patient, and she was agreeable to admission.  Patient was stable upon admission.         Final Clinical Impression(s) / ED Diagnoses Final diagnoses:  Sepsis, due to unspecified organism, unspecified whether acute organ dysfunction present [facility])  Pyelonephritis    Rx / DC Orders ED Discharge Orders     None         Peter Garter, [place] 06/08/21 1242    Wynetta Fines, MD 06/10/21 1051

## 2021-06-09 DIAGNOSIS — N390 Urinary tract infection, site not specified: Secondary | ICD-10-CM | POA: Diagnosis not present

## 2021-06-09 DIAGNOSIS — N12 Tubulo-interstitial nephritis, not specified as acute or chronic: Secondary | ICD-10-CM | POA: Diagnosis present

## 2021-06-09 DIAGNOSIS — A419 Sepsis, unspecified organism: Secondary | ICD-10-CM | POA: Diagnosis not present

## 2021-06-09 LAB — BLOOD CULTURE ID PANEL (REFLEXED) - BCID2

## 2021-06-09 LAB — CBC
HCT: 29.8 % — ABNORMAL LOW (ref 36.0–46.0)
Hemoglobin: 10.2 g/dL — ABNORMAL LOW (ref 12.0–15.0)
MCH: 30.9 pg (ref 26.0–34.0)
MCHC: 34.2 g/dL (ref 30.0–36.0)
MCV: 90.3 fL (ref 80.0–100.0)
Platelets: 173 10*3/uL (ref 150–400)
RBC: 3.3 MIL/uL — ABNORMAL LOW (ref 3.87–5.11)
RDW: 13 % (ref 11.5–15.5)
WBC: 19.7 10*3/uL — ABNORMAL HIGH (ref 4.0–10.5)
nRBC: 0 % (ref 0.0–0.2)

## 2021-06-09 LAB — BASIC METABOLIC PANEL
Anion gap: 8 (ref 5–15)
BUN: 11 mg/dL (ref 6–20)
CO2: 23 mmol/L (ref 22–32)
Calcium: 7.4 mg/dL — ABNORMAL LOW (ref 8.9–10.3)
Chloride: 105 mmol/L (ref 98–111)
Creatinine, Ser: 0.97 mg/dL (ref 0.44–1.00)
GFR, Estimated: >60 mL/min (ref >60)
Glucose, Bld: 110 mg/dL — ABNORMAL HIGH (ref 70–99)
Potassium: 3 mmol/L — ABNORMAL LOW (ref 3.5–5.1)
Sodium: 136 mmol/L (ref 135–145)

## 2021-06-09 MED ORDER — GUAIFENESIN-DM 100-10 MG/5ML PO SYRP
10.0000 mL | ORAL_SOLUTION | ORAL | Status: DC | PRN
  Filled 2021-06-09: qty 10

## 2021-06-09 MED ORDER — BUTALBITAL-APAP-CAFFEINE 50-325-40 MG PO TABS
2.0000 | ORAL_TABLET | ORAL | Status: DC | PRN
  Administered 2021-06-09 – 2021-06-13 (×11): 2 via ORAL
  Filled 2021-06-09 (×13): qty 2

## 2021-06-09 MED ORDER — ACETAMINOPHEN 325 MG PO TABS
325.0000 mg | ORAL_TABLET | Freq: Once | ORAL | Status: AC
  Administered 2021-06-09: 325 mg via ORAL
  Filled 2021-06-09: qty 1

## 2021-06-09 MED ORDER — HYDROMORPHONE HCL 1 MG/ML IJ SOLN
1.0000 mg | INTRAMUSCULAR | Status: AC | PRN
  Administered 2021-06-09 (×2): 1 mg via INTRAVENOUS
  Filled 2021-06-09 (×2): qty 1

## 2021-06-09 MED ORDER — ACETAMINOPHEN 500 MG PO TABS
1000.0000 mg | ORAL_TABLET | Freq: Four times a day (QID) | ORAL | Status: DC | PRN
  Administered 2021-06-09 – 2021-06-12 (×6): 1000 mg via ORAL
  Filled 2021-06-09 (×6): qty 2

## 2021-06-09 NOTE — Progress Notes (Signed)
PHARMACY - PHYSICIAN COMMUNICATION CRITICAL VALUE ALERT - BLOOD CULTURE IDENTIFICATION (BCID)  Claire Caldwell is an 36 y.o. female who presented to [facility] on 06/08/2021 with a chief complaint of abdominal/flank pain concerning for UTI/pyelo  Assessment:  86 YOF with concern for UTI/pyelo now with 1 of 4 bottles growing GNR with BCID detecting E.coli   Name of physician (or Provider) Contacted: McClung  Current antibiotics: Rocephin 2g IV every 24 hours  Changes to prescribed antibiotics recommended:  Patient is on recommended antibiotics - No changes needed  Results for orders placed or performed during the hospital encounter of 06/08/21  Blood Culture ID Panel (Reflexed) (Collected: 06/08/2021 10:07 AM)  Result Value Ref Range   Enterococcus faecalis NOT DETECTED NOT DETECTED   Enterococcus Faecium NOT DETECTED NOT DETECTED   Listeria monocytogenes NOT DETECTED NOT DETECTED   Staphylococcus species NOT DETECTED NOT DETECTED   Staphylococcus aureus (BCID) NOT DETECTED NOT DETECTED   Staphylococcus epidermidis NOT DETECTED NOT DETECTED   Staphylococcus lugdunensis NOT DETECTED NOT DETECTED   Streptococcus species NOT DETECTED NOT DETECTED   Streptococcus agalactiae NOT DETECTED NOT DETECTED   Streptococcus pneumoniae NOT DETECTED NOT DETECTED   Streptococcus pyogenes NOT DETECTED NOT DETECTED   A.calcoaceticus-baumannii NOT DETECTED NOT DETECTED   Bacteroides fragilis NOT DETECTED NOT DETECTED   Enterobacterales DETECTED (A) NOT DETECTED   Enterobacter cloacae complex NOT DETECTED NOT DETECTED   Escherichia coli DETECTED (A) NOT DETECTED   Klebsiella aerogenes NOT DETECTED NOT DETECTED   Klebsiella oxytoca NOT DETECTED NOT DETECTED   Klebsiella pneumoniae NOT DETECTED NOT DETECTED   Proteus species NOT DETECTED NOT DETECTED   Salmonella species NOT DETECTED NOT DETECTED   Serratia marcescens NOT DETECTED NOT DETECTED   Haemophilus influenzae NOT DETECTED NOT DETECTED    Neisseria meningitidis NOT DETECTED NOT DETECTED   Pseudomonas aeruginosa NOT DETECTED NOT DETECTED   Stenotrophomonas maltophilia NOT DETECTED NOT DETECTED   Candida albicans NOT DETECTED NOT DETECTED   Candida auris NOT DETECTED NOT DETECTED   Candida glabrata NOT DETECTED NOT DETECTED   Candida krusei NOT DETECTED NOT DETECTED   Candida parapsilosis NOT DETECTED NOT DETECTED   Candida tropicalis NOT DETECTED NOT DETECTED   Cryptococcus neoformans/gattii NOT DETECTED NOT DETECTED   CTX-M ESBL NOT DETECTED NOT DETECTED   Carbapenem resistance IMP NOT DETECTED NOT DETECTED   Carbapenem resistance KPC NOT DETECTED NOT DETECTED   Carbapenem resistance NDM NOT DETECTED NOT DETECTED   Carbapenem resist OXA 48 LIKE NOT DETECTED NOT DETECTED   Carbapenem resistance VIM NOT DETECTED NOT DETECTED    Thank you for allowing pharmacy to be a part of this patient's care.  Georgina Pillion, PharmD, BCPS Infectious Diseases Clinical Pharmacist 06/09/2021 2:25 PM   **Pharmacist phone directory can now be found on [email or web address] ([email or web address]).  Listed under [facility].

## 2021-06-09 NOTE — Progress Notes (Signed)
NEW ADMISSION NOTE New Admission Note:   Arrival Method: stretcher Mental Orientation: A&OX4 Telemetry:NONE Assessment: Completed Skin: intact IV: RAC RFA Pain:7/10 Tubes: none Safety Measures: Safety Fall Prevention Plan has been given, discussed and signed Admission: Completed [facility] Orientation: Patient has been orientated to the room, unit and staff.  Family: fiance at bedside  Orders have been reviewed and implemented. Will continue to monitor the patient. Call light has been placed within reach and bed alarm has been activated.   Heela Heishman S Willia Genrich, RN

## 2021-06-09 NOTE — ED Notes (Signed)
Dr. Antionette Char is aware of pt current temp and pain received orders for pain med and advised that if needed could apply ice packs for temp

## 2021-06-09 NOTE — Progress Notes (Signed)
Claire Caldwell  AYT:[id number] DOB: 15-Feb-1985 DOA: 06/08/2021 PCP: Nolene Ebbs, MD    Brief Narrative:  36 year old with a history of HTN and fibromyalgia who presented to the ER with a fever to 103 accompanied by suprapubic and right flank pain.  She has a history of chronic hematuria and proteinuria but has not yet been definitively diagnosed with Alport syndrome.  She is followed by nephrology in the outpatient setting.  CT abdomen and pelvis in the ER was consistent with pyelonephritis as was the patient's UA and an elevated WBC of 27  Consultants:  None  Goals of Care:  Code Status: Full Code   DVT prophylaxis: Lovenox  Interim Hx: Continues to have fevers with temperature 102.9 at 9 AM today.  Vitals otherwise stable.  Complains of unrelenting headache.  Denies shortness of breath.  Abdominal and flank pain without significant change.  Assessment & Plan:  Sepsis due to E. 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 rate (!) 24, height '5\' 8"'  (1.727 m), weight 77.6 kg, SpO2 95 %.  Intake/Output Summary (Last 24 hours) at 06/09/2021  1035 Last data filed at 06/09/2021 0349 Gross per 24 hour  Intake 2748 ml  Output 2100 ml  Net 648 ml   Filed Weights   06/08/21 2035  Weight: 77.6 kg    Examination: General: No acute respiratory distress -appears very uncomfortable Lungs: Clear to auscultation bilaterally without wheezes or crackles Cardiovascular: Regular rate and rhythm without murmur gallop or rub normal S1 and S2 Abdomen: Overweight, soft, no mass, tender to palpation across right abdomen and flank Extremities: No significant cyanosis, clubbing, or edema bilateral lower extremities  CBC: Recent Labs  Lab 06/08/21 0900 06/09/21 0341  WBC 27.7* 19.7*  HGB 13.6 10.2*  HCT 39.0 29.8*  MCV 89.0 90.3  PLT 247 355   Basic Metabolic Panel: Recent Labs  Lab 06/08/21 0900 06/09/21 0341  NA 133* 136  K 3.5 3.0*  CL 99 105  CO2 21* 23  GLUCOSE 160* 110*  BUN 20 11  CREATININE 1.29* 0.97  CALCIUM 8.4* 7.4*   GFR: Estimated Creatinine Clearance: 88.7 mL/min (by C-G formula based on SCr of 0.97 mg/dL).  Liver Function Tests: Recent Labs  Lab 06/08/21 0900  AST 23  ALT 25  ALKPHOS 84  BILITOT 0.8  PROT 6.9  ALBUMIN 3.1*   Recent Labs  Lab 06/08/21 0900  LIPASE 27    Coagulation Profile: Recent Labs  Lab 06/08/21 1028  INR 1.2    HbA1C: Hgb A1c MFr Bld  Date/Time Value Ref Range Status  06/08/2021 02:21 PM 5.1 4.8 - 5.6 % Final    Comment:    (NOTE) Pre diabetes:          5.7%-6.4%  Diabetes:              >  6.4%  Glycemic control for   <7.0% adults with diabetes     Scheduled Meds:  enoxaparin (LOVENOX) injection  40 mg Subcutaneous Q24H   gabapentin  100 mg Oral BID   melatonin  5 mg Oral QPM   sertraline  100 mg Oral QPM   sodium chloride flush  3 mL Intravenous Q12H   Continuous Infusions:  cefTRIAXone (ROCEPHIN)  IV Stopped (06/08/21 1921)   lactated ringers 100 mL/hr at 06/08/21 2330     LOS: 1 day   Cherene Altes, MD [facility] Office   [phone] Pager - Text Page per Shea Evans  If 7PM-7AM, please contact night-coverage per Amion 06/09/2021, 10:35 AM

## 2021-06-09 NOTE — ED Notes (Addendum)
Pt's temp remains high despite the tylenol. -102.2. MD made aware. Will increase ivf and some other interventions.

## 2021-06-09 NOTE — ED Notes (Signed)
Made Dr. Antionette Char aware of the decrease in hgb at this time

## 2021-06-10 DIAGNOSIS — N390 Urinary tract infection, site not specified: Secondary | ICD-10-CM | POA: Diagnosis not present

## 2021-06-10 DIAGNOSIS — A419 Sepsis, unspecified organism: Secondary | ICD-10-CM | POA: Diagnosis not present

## 2021-06-10 LAB — BASIC METABOLIC PANEL
Anion gap: 7 (ref 5–15)
BUN: 7 mg/dL (ref 6–20)
CO2: 27 mmol/L (ref 22–32)
Calcium: 8.2 mg/dL — ABNORMAL LOW (ref 8.9–10.3)
Chloride: 103 mmol/L (ref 98–111)
Creatinine, Ser: 0.99 mg/dL (ref 0.44–1.00)
GFR, Estimated: >60 mL/min (ref >60)
Glucose, Bld: 144 mg/dL — ABNORMAL HIGH (ref 70–99)
Potassium: 2.8 mmol/L — ABNORMAL LOW (ref 3.5–5.1)
Sodium: 137 mmol/L (ref 135–145)

## 2021-06-10 LAB — MAGNESIUM: Magnesium: 1.8 mg/dL (ref 1.7–2.4)

## 2021-06-10 LAB — CBC
HCT: 28.8 % — ABNORMAL LOW (ref 36.0–46.0)
Hemoglobin: 9.9 g/dL — ABNORMAL LOW (ref 12.0–15.0)
MCH: 30.2 pg (ref 26.0–34.0)
MCHC: 34.4 g/dL (ref 30.0–36.0)
MCV: 87.8 fL (ref 80.0–100.0)
Platelets: 181 10*3/uL (ref 150–400)
RBC: 3.28 MIL/uL — ABNORMAL LOW (ref 3.87–5.11)
RDW: 13 % (ref 11.5–15.5)
WBC: 14.7 10*3/uL — ABNORMAL HIGH (ref 4.0–10.5)
nRBC: 0 % (ref 0.0–0.2)

## 2021-06-10 LAB — COMPREHENSIVE METABOLIC PANEL
ALT: 20 U/L (ref 0–44)
AST: 20 U/L (ref 15–41)
Albumin: 2.1 g/dL — ABNORMAL LOW (ref 3.5–5.0)
Alkaline Phosphatase: 72 U/L (ref 38–126)
Anion gap: 8 (ref 5–15)
BUN: 7 mg/dL (ref 6–20)
CO2: 26 mmol/L (ref 22–32)
Calcium: 8.1 mg/dL — ABNORMAL LOW (ref 8.9–10.3)
Chloride: 103 mmol/L (ref 98–111)
Creatinine, Ser: 0.9 mg/dL (ref 0.44–1.00)
GFR, Estimated: >60 mL/min (ref >60)
Glucose, Bld: 185 mg/dL — ABNORMAL HIGH (ref 70–99)
Potassium: 2.3 mmol/L — CL (ref 3.5–5.1)
Sodium: 137 mmol/L (ref 135–145)
Total Bilirubin: 0.6 mg/dL (ref 0.3–1.2)
Total Protein: 5.3 g/dL — ABNORMAL LOW (ref 6.5–8.1)

## 2021-06-10 MED ORDER — POTASSIUM CHLORIDE CRYS ER 20 MEQ PO TBCR
40.0000 meq | EXTENDED_RELEASE_TABLET | ORAL | Status: AC
  Administered 2021-06-10 (×4): 40 meq via ORAL
  Filled 2021-06-10 (×4): qty 2

## 2021-06-10 MED ORDER — POTASSIUM CHLORIDE 10 MEQ/100ML IV SOLN
10.0000 meq | INTRAVENOUS | Status: DC
  Administered 2021-06-10: 10 meq via INTRAVENOUS
  Filled 2021-06-10 (×2): qty 100

## 2021-06-10 MED ORDER — HYDROMORPHONE HCL 1 MG/ML IJ SOLN
1.0000 mg | INTRAMUSCULAR | Status: DC | PRN
  Administered 2021-06-10 – 2021-06-12 (×8): 1 mg via INTRAVENOUS
  Filled 2021-06-10 (×8): qty 1

## 2021-06-10 MED ORDER — POTASSIUM CHLORIDE CRYS ER 20 MEQ PO TBCR
40.0000 meq | EXTENDED_RELEASE_TABLET | Freq: Two times a day (BID) | ORAL | Status: AC
  Administered 2021-06-10 – 2021-06-12 (×4): 40 meq via ORAL
  Filled 2021-06-10 (×4): qty 2

## 2021-06-10 MED ORDER — POTASSIUM CHLORIDE CRYS ER 20 MEQ PO TBCR: 40.0000 meq | EXTENDED_RELEASE_TABLET | Freq: Three times a day (TID) | ORAL | Status: DC

## 2021-06-10 NOTE — Progress Notes (Signed)
   06/10/21 1724  Assess: MEWS Score  Temp (!) 103.2 F (39.6 C)  BP 123/75  MAP (mmHg) 90  Pulse Rate 94  Resp 20  SpO2 94 %  Assess: MEWS Score  MEWS Temp 2  MEWS Systolic 0  MEWS Pulse 0  MEWS RR 0  MEWS LOC 0  MEWS Score 2  MEWS Score Color Yellow  Assess: if the MEWS score is Yellow or Red  Were vital signs taken at a resting state? Yes  Focused Assessment No change from prior assessment  Does the patient meet 2 or more of the SIRS criteria? Yes  Does the patient have a confirmed or suspected source of infection? Yes  Provider and Rapid Response Notified? No  MEWS guidelines implemented *See Row Information* Yes  Treat  MEWS Interventions Escalated (See documentation below)  Take Vital Signs  Increase Vital Sign Frequency  Yellow: Q 2hr X 2 then Q 4hr X 2, if remains yellow, continue Q 4hrs  Escalate  MEWS: Escalate Yellow: discuss with charge nurse/RN and consider discussing with provider and RRT  Notify: Charge Nurse/RN  Name of Charge Nurse/RN Notified Malachai Schalk A  Date Charge Nurse/RN Notified 06/10/21  Time Charge Nurse/RN Notified 1734  Notify: Provider  Provider Name/Title Jetty Duhamel, MD  Date Provider Notified 06/10/21  Time Provider Notified 1735  Method of Notification Page  Notification Reason Other (Comment) (temp)  Provider response No new orders  Date of Provider Response 06/10/21  Time of Provider Response 1735  Document  Progress note created (see row info) Yes  Assess: SIRS CRITERIA  SIRS Temperature  1  SIRS Pulse 1  SIRS Respirations  0  SIRS WBC 0  SIRS Score Sum  2

## 2021-06-10 NOTE — Progress Notes (Signed)
   06/09/21 2052  Assess: MEWS Score  Temp (!) 103 F (39.4 C)  BP 121/74  MAP (mmHg) 87  Pulse Rate 94  Resp 18  SpO2 100 %  O2 Device Room Air  Assess: MEWS Score  MEWS Temp 2  MEWS Systolic 0  MEWS Pulse 0  MEWS RR 0  MEWS LOC 0  MEWS Score 2  MEWS Score Color Yellow  Assess: if the MEWS score is Yellow or Red  Were vital signs taken at a resting state? Yes  Focused Assessment Change from prior assessment (see assessment flowsheet)  Does the patient meet 2 or more of the SIRS criteria? Yes  Does the patient have a confirmed or suspected source of infection? Yes  Provider and Rapid Response Notified? No  MEWS guidelines implemented *See Row Information* Yes  Take Vital Signs  Increase Vital Sign Frequency  Yellow: Q 2hr X 2 then Q 4hr X 2, if remains yellow, continue Q 4hrs  Escalate  MEWS: Escalate Yellow: discuss with charge nurse/RN and consider discussing with provider and RRT  Notify: Charge Nurse/RN  Name of Charge Nurse/RN Notified Duru Reiger, RN  Date Charge Nurse/RN Notified 06/09/21  Time Charge Nurse/RN Notified 2052  Notify: Provider  Provider Name/Title N/A  Notify: Rapid Response  Name of Rapid Response RN Notified N/A  Document  Patient Outcome Stabilized after interventions  Progress note created (see row info) Yes  Assess: SIRS CRITERIA  SIRS Temperature  1  SIRS Pulse 1  SIRS Respirations  0  SIRS WBC 0  SIRS Score Sum  2

## 2021-06-10 NOTE — Progress Notes (Signed)
Claire Caldwell  WFU:[id number] DOB: 25-Mar-1985 DOA: 06/08/2021 PCP: Nolene Ebbs, MD    Brief Narrative:  36 year old with a history of HTN and fibromyalgia who presented to the ER with a fever to 103 accompanied by suprapubic and right flank pain.  She has a history of chronic hematuria and proteinuria but has not yet been definitively diagnosed with Alport syndrome.  She is followed by nephrology in the outpatient setting.  CT abdomen and pelvis in the ER was consistent with pyelonephritis as was the patient's UA and an elevated WBC of 27  Consultants:  None  Goals of Care:  Code Status: Full Code   DVT prophylaxis: Lovenox  Interim Hx: Complains of ongoing severe abdominal and flank pain.  States she feels somewhat better.  Oral intake improved somewhat.  No nausea or vomiting.  No shortness of breath or chest pain.  Assessment & Plan:  Sepsis due to E. coli pyelonephritis with bacteremia Patient met sepsis criteria at time of presentation due to clear source of bacterial infection, tachycardia, elevated WBC, and fever -continue empiric antibiotic and follow culture to allow tailoring of therapy -showing evidence of slow improvement presently  Severe persisting hypokalemia Likely due to poor intake -continue to supplement with preference for oral route as patient does not tolerate IV potassium -magnesium is within normal range  HTN Avoid overcorrection in setting of significant sepsis presently  Fibromyalgia Continue usual home medications  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 rate (!) 21, height _0  (1.727 m), weight 84.3 kg, SpO2 95 %.  Intake/Output Summary (Last 24 hours) at 06/10/2021 1658 Last data filed at 06/10/2021 0949 Gross per 24 hour  Intake 3925.39 ml  Output 2700 ml  Net 1225.39 ml   Filed Weights   06/08/21 2035 06/09/21 1454  Weight: 77.6 kg 84.3 kg    Examination: General: No acute respiratory distress Lungs: Clear to auscultation bilaterally without wheezes or crackles Cardiovascular: RRR without murmur Abdomen: Overweight, soft, no mass, tender to palpation across right abdomen and flank Extremities: Trace bilateral lower extremity edema  CBC: Recent Labs  Lab 06/08/21 0900 06/09/21 0341 06/10/21 0552  WBC 27.7* 19.7* 14.7*  HGB 13.6 10.2* 9.9*  HCT 39.0 29.8* 28.8*  MCV 89.0 90.3 87.8  PLT 247 173 202   Basic Metabolic Panel: Recent Labs  Lab 06/09/21 0341 06/10/21 0552 06/10/21 1326  NA 136 137 137  K 3.0* 2.3* 2.8*  CL 105 103 103  CO2 _1 GLUCOSE 110* 185* 144*  BUN _2 CREATININE 0.97 0.90 0.99  CALCIUM 7.4* 8.1* 8.2*  MG  --  1.8  --    GFR: Estimated Creatinine Clearance: 90.3 mL/min (by C-G formula based on SCr of 0.99 mg/dL).  Liver Function Tests: Recent Labs  Lab 06/08/21 0900 06/10/21 0552  AST 23 20  ALT 25 20  ALKPHOS 84 72  BILITOT 0.8 0.6  PROT 6.9 5.3*  ALBUMIN 3.1* 2.1*    Scheduled Meds:  enoxaparin (LOVENOX) injection  40 mg Subcutaneous Q24H   gabapentin  100 mg Oral BID   melatonin  5 mg Oral QPM   sertraline  100  mg Oral QPM   sodium chloride flush  3 mL Intravenous Q12H   Continuous Infusions:  cefTRIAXone (ROCEPHIN)  IV 2 g (06/09/21 1800)   lactated ringers 100 mL/hr at 06/10/21 1341     LOS: 2 days   Cherene Altes, MD [facility] Office  [phone] Pager - Text Page per Shea Evans  If 7PM-7AM, please contact night-coverage per Amion 06/10/2021, 4:58 PM

## 2021-06-10 NOTE — Progress Notes (Signed)
  Transition of Care [facility]) Screening Note   Patient Details  Name: Claire Caldwell Date of Birth: 11-21-1985   Transition of Care [facility]) CM/SW Contact:    Tom-Johnson, Renea Ee, RN Phone Number: 06/10/2021, 3:07 PM  CM spoke with patient at bedside about needs for post hospital transition. Admitted for Sepsis 2/2 UTI. From home with Fiance and three children. Both parents and one sibling are supportive.  Currently employed at [employer]. Independent with care and drive self prior to admission. Does not have DME's at home.  PCP is Nolene Ebbs, MD and uses [facility] on [address].   Transition of Care Department [facility]) has reviewed patient and no TOC needs or recommendations have been identified at this time. TOC will continue to monitor patient advancement through interdisciplinary progression rounds. If new patient transition needs arise, please place a TOC consult.

## 2021-06-11 ENCOUNTER — Inpatient Hospital Stay (HOSPITAL_COMMUNITY): Payer: Medicaid Other

## 2021-06-11 DIAGNOSIS — N10 Acute tubulo-interstitial nephritis: Secondary | ICD-10-CM | POA: Diagnosis not present

## 2021-06-11 DIAGNOSIS — N390 Urinary tract infection, site not specified: Secondary | ICD-10-CM | POA: Diagnosis not present

## 2021-06-11 DIAGNOSIS — A419 Sepsis, unspecified organism: Secondary | ICD-10-CM | POA: Diagnosis not present

## 2021-06-11 DIAGNOSIS — N281 Cyst of kidney, acquired: Secondary | ICD-10-CM | POA: Diagnosis not present

## 2021-06-11 DIAGNOSIS — R600 Localized edema: Secondary | ICD-10-CM | POA: Diagnosis not present

## 2021-06-11 DIAGNOSIS — N2881 Hypertrophy of kidney: Secondary | ICD-10-CM | POA: Diagnosis not present

## 2021-06-11 LAB — CBC
HCT: 30.8 % — ABNORMAL LOW (ref 36.0–46.0)
Hemoglobin: 10.6 g/dL — ABNORMAL LOW (ref 12.0–15.0)
MCH: 30.4 pg (ref 26.0–34.0)
MCHC: 34.4 g/dL (ref 30.0–36.0)
MCV: 88.3 fL (ref 80.0–100.0)
Platelets: 196 10*3/uL (ref 150–400)
RBC: 3.49 MIL/uL — ABNORMAL LOW (ref 3.87–5.11)
RDW: 13.3 % (ref 11.5–15.5)
WBC: 15.5 10*3/uL — ABNORMAL HIGH (ref 4.0–10.5)
nRBC: 0 % (ref 0.0–0.2)

## 2021-06-11 LAB — COMPREHENSIVE METABOLIC PANEL
ALT: 21 U/L (ref 0–44)
AST: 20 U/L (ref 15–41)
Albumin: 2.1 g/dL — ABNORMAL LOW (ref 3.5–5.0)
Alkaline Phosphatase: 115 U/L (ref 38–126)
Anion gap: 9 (ref 5–15)
BUN: <5 mg/dL — ABNORMAL LOW (ref 6–20)
CO2: 28 mmol/L (ref 22–32)
Calcium: 8.4 mg/dL — ABNORMAL LOW (ref 8.9–10.3)
Chloride: 99 mmol/L (ref 98–111)
Creatinine, Ser: 0.85 mg/dL (ref 0.44–1.00)
GFR, Estimated: >60 mL/min (ref >60)
Glucose, Bld: 109 mg/dL — ABNORMAL HIGH (ref 70–99)
Potassium: 3.5 mmol/L (ref 3.5–5.1)
Sodium: 136 mmol/L (ref 135–145)
Total Bilirubin: 0.8 mg/dL (ref 0.3–1.2)
Total Protein: 5.7 g/dL — ABNORMAL LOW (ref 6.5–8.1)

## 2021-06-11 LAB — CULTURE, BLOOD (ROUTINE X 2): Special Requests: ADEQUATE

## 2021-06-11 MED ORDER — IOHEXOL 300 MG/ML  SOLN
100.0000 mL | Freq: Once | INTRAMUSCULAR | Status: AC | PRN
  Administered 2021-06-11: 100 mL via INTRAVENOUS

## 2021-06-11 NOTE — Progress Notes (Signed)
PROGRESS NOTE    Claire Caldwell  OAC:[id number] DOB: 04/17/1985 DOA: 06/08/2021 PCP: Nolene Ebbs, MD    Brief Narrative:  36 year old with a history of HTN and fibromyalgia who presented to the ER with a fever to 103 accompanied by suprapubic and right flank pain.  She has a history of chronic hematuria and proteinuria but has not yet been definitively diagnosed with Al[place] syndrome.  She is followed by nephrology in the outpatient setting.  CT abdomen and pelvis in the ER was consistent with pyelonephritis as was the patient's UA and an elevated WBC of 27.  Blood cultures with pansensitive E. coli, unfortunately urine culture was not done.   Assessment & Plan:   Sepsis secondary to E. coli pyelonephritis with bacteremia: Persistent fever. Patient met sepsis criteria on presentation due to tachycardia, elevated WBC count and fever. Blood cultures positive for E. Coli Urine cultures not done on admission Currently remains on Rocephin 2 g daily Persistent fever despite being on Rocephin, recheck CT scan with contrast today to rule out pyelonephrosis/complicated abscess.  Hypokalemia: Severe and persistent.  Replace aggressively.  Magnesium normal.  Fibromyalgia: Aggravated painful condition due to acute infection.  Home medications.  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   DVT prophylaxis: enoxaparin (LOVENOX) injection 40 mg Start: 06/08/21 2200   Code Status: Full code Family Communication: Significant other at the bedside Disposition Plan: Status is: Inpatient Remains inpatient appropriate because: Persistent fever, on IV antibiotics     Consultants:  None  Procedures:  None  Antimicrobials:  Rocephin 6/4----   Subjective: Patient seen and examined.  Anxious.  Temperature 102.8 overnight.  Right flank pain.  Hurts all over.  Objective: Vitals:   06/10/21 2016 06/11/21 0454 06/11/21 0600 06/11/21 0912  BP: 106/65 133/82  (!) 126/92  Pulse: 80  94  79  Resp: '18 18  19  ' Temp: 99.4 F (37.4 C) (!) 102.8 F (39.3 C) 99.2 F (37.3 C) 98.9 F (37.2 C)  TempSrc: Oral Oral Oral Oral  SpO2: 90% 91%  96%  Weight:      Height:        Intake/Output Summary (Last 24 hours) at 06/11/2021 1311 Last data filed at 06/11/2021 0900 Gross per 24 hour  Intake 2324.65 ml  Output 2600 ml  Net -275.35 ml   Filed Weights   06/08/21 2035 06/09/21 1454  Weight: 77.6 kg 84.3 kg    Examination:  General: Anxious.  In moderate distress due to pain. Cardiovascular: S1-S2 normal.  Tachycardic.  Regular rate rhythm. Respiratory: Bilateral clear.  No added sounds. Gastrointestinal: Soft.  Nontender.  Bowel sound present.  Right flank tenderness without any palpable mass or deformity. Ext: No swelling.  No cyanosis or edema. Neuro: Intact. Musculoskeletal: No deformities.      Data Reviewed: I have personally reviewed following labs and imaging studies  CBC: Recent Labs  Lab 06/08/21 0900 06/09/21 0341 06/10/21 0552 06/11/21 0652  WBC 27.7* 19.7* 14.7* 15.5*  HGB 13.6 10.2* 9.9* 10.6*  HCT 39.0 29.8* 28.8* 30.8*  MCV 89.0 90.3 87.8 88.3  PLT 247 173 181 010   Basic Metabolic Panel: Recent Labs  Lab 06/08/21 0900 06/09/21 0341 06/10/21 0552 06/10/21 1326 06/11/21 0652  NA 133* 136 137 137 136  K 3.5 3.0* 2.3* 2.8* 3.5  CL 99 105 103 103 99  CO2 21* '23 26 27 28  ' GLUCOSE 160* 110* 185* 144* 109*  BUN '20 11 7 7 ' <5*  CREATININE 1.29* 0.97 0.90  0.99 0.85  CALCIUM 8.4* 7.4* 8.1* 8.2* 8.4*  MG  --   --  1.8  --   --    GFR: Estimated Creatinine Clearance: 105.1 mL/min (by C-G formula based on SCr of 0.85 mg/dL). Liver Function Tests: Recent Labs  Lab 06/08/21 0900 06/10/21 0552 06/11/21 0652  AST '23 20 20  ' ALT '25 20 21  ' ALKPHOS 84 72 115  BILITOT 0.8 0.6 0.8  PROT 6.9 5.3* 5.7*  ALBUMIN 3.1* 2.1* 2.1*   Recent Labs  Lab 06/08/21 0900  LIPASE 27   No results for input(s): AMMONIA in the last 168  hours. Coagulation Profile: Recent Labs  Lab 06/08/21 1028  INR 1.2   Cardiac Enzymes: No results for input(s): CKTOTAL, CKMB, CKMBINDEX, TROPONINI in the last 168 hours. BNP (last 3 results) No results for input(s): PROBNP in the last 8760 hours. HbA1C: Recent Labs    06/08/21 1421  HGBA1C 5.1   CBG: No results for input(s): GLUCAP in the last 168 hours. Lipid Profile: No results for input(s): CHOL, HDL, LDLCALC, TRIG, CHOLHDL, LDLDIRECT in the last 72 hours. Thyroid Function Tests: No results for input(s): TSH, T4TOTAL, FREET4, T3FREE, THYROIDAB in the last 72 hours. Anemia Panel: No results for input(s): VITAMINB12, FOLATE, FERRITIN, TIBC, IRON, RETICCTPCT in the last 72 hours. Sepsis Labs: Recent Labs  Lab 06/08/21 1028 06/08/21 1421  PROCALCITON  --  5.33  LATICACIDVEN 2.0* 1.4    Recent Results (from the past 240 hour(s))  Resp Panel by RT-PCR (Flu A&B, Covid)     Status: None   Collection Time: 06/08/21 10:07 AM   Specimen: Nasal Swab  Result Value Ref Range Status   SARS Coronavirus 2 by RT PCR NEGATIVE NEGATIVE Final    Comment: (NOTE) SARS-CoV-2 target nucleic acids are NOT DETECTED.  The SARS-CoV-2 RNA is generally detectable in upper respiratory specimens during the acute phase of infection. The lowest concentration of SARS-CoV-2 viral copies this assay can detect is 138 copies/mL. A negative result does not preclude SARS-Cov-2 infection and should not be used as the sole basis for treatment or other patient management decisions. A negative result may occur with  improper specimen collection/handling, submission of specimen other than nasopharyngeal swab, presence of viral mutation(s) within the areas targeted by this assay, and inadequate number of viral copies(<138 copies/mL). A negative result must be combined with clinical observations, patient history, and epidemiological information. The expected result is Negative.  Fact Sheet for Patients:   EntrepreneurPulse.com.au  Fact Sheet for Healthcare Providers:  IncredibleEmployment.be  This test is no t yet approved or cleared by the [place] FDA and  has been authorized for detection and/or diagnosis of SARS-CoV-2 by FDA under an Emergency Use Authorization (EUA). This EUA will remain  in effect (meaning this test can be used) for the duration of the COVID-19 declaration under Section 564(b)(1) of the Act, 21 U.S.C.section 360bbb-3(b)(1), unless the authorization is terminated  or revoked sooner.       Influenza A by PCR NEGATIVE NEGATIVE Final   Influenza B by PCR NEGATIVE NEGATIVE Final    Comment: (NOTE) The Xpert Xpress SARS-CoV-2/FLU/RSV plus assay is intended as an aid in the diagnosis of influenza from Nasopharyngeal swab specimens and should not be used as a sole basis for treatment. Nasal washings and aspirates are unacceptable for Xpert Xpress SARS-CoV-2/FLU/RSV testing.  Fact Sheet for Patients: EntrepreneurPulse.com.au  Fact Sheet for Healthcare Providers: IncredibleEmployment.be  This test is not yet approved or cleared by the  [place] Architectural technologist and has been authorized for detection and/or diagnosis of SARS-CoV-2 by FDA under an Print production planner (EUA). This EUA will remain in effect (meaning this test can be used) for the duration of the COVID-19 declaration under Section 564(b)(1) of the Act, 21 U.S.C. section 360bbb-3(b)(1), unless the authorization is terminated or revoked.  Performed at [facility], [place] [address], [place], [place] [address]   Blood Culture (routine x 2)     Status: Abnormal   Collection Time: 06/08/21 10:07 AM   Specimen: BLOOD  Result Value Ref Range Status   Specimen Description BLOOD SITE NOT SPECIFIED  Final   Special Requests   Final    BOTTLES DRAWN AEROBIC AND ANAEROBIC Blood Culture adequate volume   Culture  Setup Time   Final     GRAM NEGATIVE RODS ANAEROBIC BOTTLE ONLY CRITICAL RESULT CALLED TO, READ BACK BY AND VERIFIED WITH: PHARMD E.MARTIN AT 1000 ON 06/09/2021 BY T.SAAD. Performed at [place] [facility], [place] [address] [place], [place] [address]    Culture ESCHERICHIA COLI (A)  Final   Report Status 06/11/2021 FINAL  Final   Organism ID, Bacteria ESCHERICHIA COLI  Final      Susceptibility   Escherichia coli - MIC*    AMPICILLIN <=2 SENSITIVE Sensitive     CEFAZOLIN <=4 SENSITIVE Sensitive     CEFEPIME <=0.12 SENSITIVE Sensitive     CEFTAZIDIME <=1 SENSITIVE Sensitive     CEFTRIAXONE <=0.25 SENSITIVE Sensitive     CIPROFLOXACIN <=0.25 SENSITIVE Sensitive     GENTAMICIN <=1 SENSITIVE Sensitive     IMIPENEM <=0.25 SENSITIVE Sensitive     TRIMETH/SULFA <=20 SENSITIVE Sensitive     AMPICILLIN/SULBACTAM <=2 SENSITIVE Sensitive     PIP/TAZO <=4 SENSITIVE Sensitive     * ESCHERICHIA COLI  Blood Culture ID Panel (Reflexed)     Status: Abnormal   Collection Time: 06/08/21 10:07 AM  Result Value Ref Range Status   Enterococcus faecalis NOT DETECTED NOT DETECTED Final   Enterococcus Faecium NOT DETECTED NOT DETECTED Final   Listeria monocytogenes NOT DETECTED NOT DETECTED Final   Staphylococcus species NOT DETECTED NOT DETECTED Final   Staphylococcus aureus (BCID) NOT DETECTED NOT DETECTED Final   Staphylococcus epidermidis NOT DETECTED NOT DETECTED Final   Staphylococcus lugdunensis NOT DETECTED NOT DETECTED Final   Streptococcus species NOT DETECTED NOT DETECTED Final   Streptococcus agalactiae NOT DETECTED NOT DETECTED Final   Streptococcus pneumoniae NOT DETECTED NOT DETECTED Final   Streptococcus pyogenes NOT DETECTED NOT DETECTED Final   A.calcoaceticus-baumannii NOT DETECTED NOT DETECTED Final   Bacteroides fragilis NOT DETECTED NOT DETECTED Final   Enterobacterales DETECTED (A) NOT DETECTED Final    Comment: Enterobacterales represent a large order of gram negative bacteria, not a single  organism. CRITICAL RESULT CALLED TO, READ BACK BY AND VERIFIED WITH: PHARMD E.MARTIN AT 1000 ON 06/09/2021 BY T.SAAD.    Enterobacter cloacae complex NOT DETECTED NOT DETECTED Final   Escherichia coli DETECTED (A) NOT DETECTED Final    Comment: CRITICAL RESULT CALLED TO, READ BACK BY AND VERIFIED WITH: PHARMD E.MARTIN AT 1000 ON 06/09/2021 BY T.SAAD.    Klebsiella aerogenes NOT DETECTED NOT DETECTED Final   Klebsiella oxytoca NOT DETECTED NOT DETECTED Final   Klebsiella pneumoniae NOT DETECTED NOT DETECTED Final   Proteus species NOT DETECTED NOT DETECTED Final   Salmonella species NOT DETECTED NOT DETECTED Final   Serratia marcescens NOT DETECTED NOT DETECTED Final   Haemophilus influenzae NOT  DETECTED NOT DETECTED Final   Neisseria meningitidis NOT DETECTED NOT DETECTED Final   Pseudomonas aeruginosa NOT DETECTED NOT DETECTED Final   Stenotrophomonas maltophilia NOT DETECTED NOT DETECTED Final   Candida albicans NOT DETECTED NOT DETECTED Final   Candida auris NOT DETECTED NOT DETECTED Final   Candida glabrata NOT DETECTED NOT DETECTED Final   Candida krusei NOT DETECTED NOT DETECTED Final   Candida parapsilosis NOT DETECTED NOT DETECTED Final   Candida tropicalis NOT DETECTED NOT DETECTED Final   Cryptococcus neoformans/gattii NOT DETECTED NOT DETECTED Final   CTX-M ESBL NOT DETECTED NOT DETECTED Final   Carbapenem resistance IMP NOT DETECTED NOT DETECTED Final   Carbapenem resistance KPC NOT DETECTED NOT DETECTED Final   Carbapenem resistance NDM NOT DETECTED NOT DETECTED Final   Carbapenem resist OXA 48 LIKE NOT DETECTED NOT DETECTED Final   Carbapenem resistance VIM NOT DETECTED NOT DETECTED Final    Comment: Performed at [facility], [address], [place], [place] [address]  Blood Culture (routine x 2)     Status: None (Preliminary result)   Collection Time: 06/08/21 10:12 AM   Specimen: BLOOD  Result Value Ref Range Status   Specimen Description BLOOD RIGHT  ANTECUBITAL  Final   Special Requests   Final    BOTTLES DRAWN AEROBIC AND ANAEROBIC Blood Culture adequate volume   Culture   Final    NO GROWTH 3 DAYS Performed at [facility], [place] [address] [place], [place] [address]    Report Status PENDING  Incomplete         Radiology Studies: No results found.      Scheduled Meds:  enoxaparin (LOVENOX) injection  40 mg Subcutaneous Q24H   gabapentin  100 mg Oral BID   melatonin  5 mg Oral QPM   potassium chloride  40 mEq Oral BID   sertraline  100 mg Oral QPM   sodium chloride flush  3 mL Intravenous Q12H   Continuous Infusions:  cefTRIAXone (ROCEPHIN)  IV Stopped (06/10/21 2010)   lactated ringers 75 mL/hr at 06/11/21 0300     LOS: 3 days    Time spent: 35 minutes    Barb Merino, MD [facility] Pager [phone]

## 2021-06-12 DIAGNOSIS — N390 Urinary tract infection, site not specified: Secondary | ICD-10-CM | POA: Diagnosis not present

## 2021-06-12 DIAGNOSIS — A419 Sepsis, unspecified organism: Secondary | ICD-10-CM | POA: Diagnosis not present

## 2021-06-12 MED ORDER — OXYCODONE HCL 5 MG PO TABS
10.0000 mg | ORAL_TABLET | ORAL | Status: DC | PRN
  Administered 2021-06-12 – 2021-06-13 (×6): 10 mg via ORAL
  Filled 2021-06-12 (×6): qty 2

## 2021-06-12 NOTE — Progress Notes (Signed)
PROGRESS NOTE    Claire Caldwell  ATF:[id number] DOB: Feb 14, 1985 DOA: 06/08/2021 PCP: Nolene Ebbs, MD    Brief Narrative:  36 year old with a history of HTN and fibromyalgia who presented to the ER with fever up to 103 accompanied by suprapubic and right flank pain.  She has a history of chronic hematuria and proteinuria but has not yet been definitively diagnosed with Alport syndrome.  She is followed by nephrology in the outpatient setting.  CT abdomen and pelvis in the ER was consistent with pyelonephritis as was the patient's UA and an elevated WBC of 27.  Blood cultures with pansensitive E. coli, unfortunately urine culture was not done.   Assessment & Plan:   Sepsis secondary to E. coli pyelonephritis with bacteremia: Persistent fever. Patient met sepsis criteria on presentation due to tachycardia, elevated WBC count and fever. 6/5 blood cultures positive for E. Coli.  Repeat blood cultures 6/7, negative so far. Urine cultures not done on admission. Currently remains on Rocephin 2 g daily Due to persistent fever, CT scan was repeated 6/7 that showed pyelonephritis without complicated abscess. Continue Rocephin today.  Hypokalemia: Severe and persistent.  Improved with replacement.  Recheck tomorrow.  Fibromyalgia: Aggravated painful condition due to acute infection.  Home medications.  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   DVT prophylaxis: enoxaparin (LOVENOX) injection 40 mg Start: 06/08/21 2200   Code Status: Full code Family Communication: Significant other at the bedside Disposition Plan: Status is: Inpatient Remains inpatient appropriate because: Persistent fever, on IV antibiotics     Consultants:  None  Procedures:  None  Antimicrobials:  Rocephin 6/4----   Subjective:  Patient seen and examined.  Body ache, headache and ear ache.  Right flank pain slightly better than yesterday.  Low-grade temperature overnight.  Temperature maximum  102 last 24 hours.  Objective: Vitals:   06/11/21 1645 06/11/21 2037 06/12/21 0543 06/12/21 0908  BP: 128/76 109/66 122/71 112/74  Pulse: 72 65 66 60  Resp: '18 18 18 18  ' Temp: (!) 100.7 F (38.2 C) 98.5 F (36.9 C) 98.7 F (37.1 C) 98.3 F (36.8 C)  TempSrc: Oral Oral Oral Oral  SpO2: 95% 97% 96% 97%  Weight:      Height:        Intake/Output Summary (Last 24 hours) at 06/12/2021 1148 Last data filed at 06/12/2021 0542 Gross per 24 hour  Intake 2391.72 ml  Output 2950 ml  Net -558.28 ml    Filed Weights   06/08/21 2035 06/09/21 1454  Weight: 77.6 kg 84.3 kg    Examination:  General: Anxious.  Looks comfortable.  Mild distress with pain while moving. Cardiovascular: S1-S2 normal.  Tachycardic.  Regular rate rhythm. Respiratory: Bilateral clear.  No added sounds. Gastrointestinal: Soft.  Nontender.  Bowel sound present.  Right flank tenderness without any palpable mass or deformity. Ext: No swelling.  No cyanosis or edema. Neuro: Intact. Musculoskeletal: No deformities.      Data Reviewed: I have personally reviewed following labs and imaging studies  CBC: Recent Labs  Lab 06/08/21 0900 06/09/21 0341 06/10/21 0552 06/11/21 0652  WBC 27.7* 19.7* 14.7* 15.5*  HGB 13.6 10.2* 9.9* 10.6*  HCT 39.0 29.8* 28.8* 30.8*  MCV 89.0 90.3 87.8 88.3  PLT 247 173 181 270    Basic Metabolic Panel: Recent Labs  Lab 06/08/21 0900 06/09/21 0341 06/10/21 0552 06/10/21 1326 06/11/21 0652  NA 133* 136 137 137 136  K 3.5 3.0* 2.3* 2.8* 3.5  CL 99 105 103  103 99  CO2 21* '23 26 27 28  ' GLUCOSE 160* 110* 185* 144* 109*  BUN '20 11 7 7 ' <5*  CREATININE 1.29* 0.97 0.90 0.99 0.85  CALCIUM 8.4* 7.4* 8.1* 8.2* 8.4*  MG  --   --  1.8  --   --     GFR: Estimated Creatinine Clearance: 105.1 mL/min (by C-G formula based on SCr of 0.85 mg/dL). Liver Function Tests: Recent Labs  Lab 06/08/21 0900 06/10/21 0552 06/11/21 0652  AST '23 20 20  ' ALT '25 20 21  ' ALKPHOS 84 72 115   BILITOT 0.8 0.6 0.8  PROT 6.9 5.3* 5.7*  ALBUMIN 3.1* 2.1* 2.1*    Recent Labs  Lab 06/08/21 0900  LIPASE 27    No results for input(s): "AMMONIA" in the last 168 hours. Coagulation Profile: Recent Labs  Lab 06/08/21 1028  INR 1.2    Cardiac Enzymes: No results for input(s): "CKTOTAL", "CKMB", "CKMBINDEX", "TROPONINI" in the last 168 hours. BNP (last 3 results) No results for input(s): "PROBNP" in the last 8760 hours. HbA1C: No results for input(s): "HGBA1C" in the last 72 hours.  CBG: No results for input(s): "GLUCAP" in the last 168 hours. Lipid Profile: No results for input(s): "CHOL", "HDL", "LDLCALC", "TRIG", "CHOLHDL", "LDLDIRECT" in the last 72 hours. Thyroid Function Tests: No results for input(s): "TSH", "T4TOTAL", "FREET4", "T3FREE", "THYROIDAB" in the last 72 hours. Anemia Panel: No results for input(s): "VITAMINB12", "FOLATE", "FERRITIN", "TIBC", "IRON", "RETICCTPCT" in the last 72 hours. Sepsis Labs: Recent Labs  Lab 06/08/21 1028 06/08/21 1421  PROCALCITON  --  5.33  LATICACIDVEN 2.0* 1.4     Recent Results (from the past 240 hour(s))  Resp Panel by RT-PCR (Flu A&B, Covid)     Status: None   Collection Time: 06/08/21 10:07 AM   Specimen: Nasal Swab  Result Value Ref Range Status   SARS Coronavirus 2 by RT PCR NEGATIVE NEGATIVE Final    Comment: (NOTE) SARS-CoV-2 target nucleic acids are NOT DETECTED.  The SARS-CoV-2 RNA is generally detectable in upper respiratory specimens during the acute phase of infection. The lowest concentration of SARS-CoV-2 viral copies this assay can detect is 138 copies/mL. A negative result does not preclude SARS-Cov-2 infection and should not be used as the sole basis for treatment or other patient management decisions. A negative result may occur with  improper specimen collection/handling, submission of specimen other than nasopharyngeal swab, presence of viral mutation(s) within the areas targeted by this  assay, and inadequate number of viral copies(<138 copies/mL). A negative result must be combined with clinical observations, patient history, and epidemiological information. The expected result is Negative.  Fact Sheet for Patients:  EntrepreneurPulse.com.au  Fact Sheet for Healthcare Providers:  IncredibleEmployment.be  This test is no t yet approved or cleared by the [place] FDA and  has been authorized for detection and/or diagnosis of SARS-CoV-2 by FDA under an Emergency Use Authorization (EUA). This EUA will remain  in effect (meaning this test can be used) for the duration of the COVID-19 declaration under Section 564(b)(1) of the Act, 21 U.S.C.section 360bbb-3(b)(1), unless the authorization is terminated  or revoked sooner.       Influenza A by PCR NEGATIVE NEGATIVE Final   Influenza B by PCR NEGATIVE NEGATIVE Final    Comment: (NOTE) The Xpert Xpress SARS-CoV-2/FLU/RSV plus assay is intended as an aid in the diagnosis of influenza from Nasopharyngeal swab specimens and should not be used as a sole basis for treatment. Nasal washings and  aspirates are unacceptable for Xpert Xpress SARS-CoV-2/FLU/RSV testing.  Fact Sheet for Patients: EntrepreneurPulse.com.au  Fact Sheet for Healthcare Providers: IncredibleEmployment.be  This test is not yet approved or cleared by the [place] FDA and has been authorized for detection and/or diagnosis of SARS-CoV-2 by FDA under an Emergency Use Authorization (EUA). This EUA will remain in effect (meaning this test can be used) for the duration of the COVID-19 declaration under Section 564(b)(1) of the Act, 21 U.S.C. section 360bbb-3(b)(1), unless the authorization is terminated or revoked.  Performed at [facility], [place] [address] [place], [place] [address]   Blood Culture (routine x 2)     Status: Abnormal   Collection Time: 06/08/21 10:07  AM   Specimen: BLOOD  Result Value Ref Range Status   Specimen Description BLOOD SITE NOT SPECIFIED  Final   Special Requests   Final    BOTTLES DRAWN AEROBIC AND ANAEROBIC Blood Culture adequate volume   Culture  Setup Time   Final    GRAM NEGATIVE RODS ANAEROBIC BOTTLE ONLY CRITICAL RESULT CALLED TO, READ BACK BY AND VERIFIED WITH: PHARMD E.MARTIN AT 1000 ON 06/09/2021 BY T.SAAD. Performed at [facility], [place] [address], [place], [place] [address]    Culture ESCHERICHIA COLI (A)  Final   Report Status 06/11/2021 FINAL  Final   Organism ID, Bacteria ESCHERICHIA COLI  Final      Susceptibility   Escherichia coli - MIC*    AMPICILLIN <=2 SENSITIVE Sensitive     CEFAZOLIN <=4 SENSITIVE Sensitive     CEFEPIME <=0.12 SENSITIVE Sensitive     CEFTAZIDIME <=1 SENSITIVE Sensitive     CEFTRIAXONE <=0.25 SENSITIVE Sensitive     CIPROFLOXACIN <=0.25 SENSITIVE Sensitive     GENTAMICIN <=1 SENSITIVE Sensitive     IMIPENEM <=0.25 SENSITIVE Sensitive     TRIMETH/SULFA <=20 SENSITIVE Sensitive     AMPICILLIN/SULBACTAM <=2 SENSITIVE Sensitive     PIP/TAZO <=4 SENSITIVE Sensitive     * ESCHERICHIA COLI  Blood Culture ID Panel (Reflexed)     Status: Abnormal   Collection Time: 06/08/21 10:07 AM  Result Value Ref Range Status   Enterococcus faecalis NOT DETECTED NOT DETECTED Final   Enterococcus Faecium NOT DETECTED NOT DETECTED Final   Listeria monocytogenes NOT DETECTED NOT DETECTED Final   Staphylococcus species NOT DETECTED NOT DETECTED Final   Staphylococcus aureus (BCID) NOT DETECTED NOT DETECTED Final   Staphylococcus epidermidis NOT DETECTED NOT DETECTED Final   Staphylococcus lugdunensis NOT DETECTED NOT DETECTED Final   Streptococcus species NOT DETECTED NOT DETECTED Final   Streptococcus agalactiae NOT DETECTED NOT DETECTED Final   Streptococcus pneumoniae NOT DETECTED NOT DETECTED Final   Streptococcus pyogenes NOT DETECTED NOT DETECTED Final   A.calcoaceticus-baumannii NOT  DETECTED NOT DETECTED Final   Bacteroides fragilis NOT DETECTED NOT DETECTED Final   Enterobacterales DETECTED (A) NOT DETECTED Final    Comment: Enterobacterales represent a large order of gram negative bacteria, not a single organism. CRITICAL RESULT CALLED TO, READ BACK BY AND VERIFIED WITH: PHARMD E.MARTIN AT 1000 ON 06/09/2021 BY T.SAAD.    Enterobacter cloacae complex NOT DETECTED NOT DETECTED Final   Escherichia coli DETECTED (A) NOT DETECTED Final    Comment: CRITICAL RESULT CALLED TO, READ BACK BY AND VERIFIED WITH: PHARMD E.MARTIN AT 1000 ON 06/09/2021 BY T.SAAD.    Klebsiella aerogenes NOT DETECTED NOT DETECTED Final   Klebsiella oxytoca NOT DETECTED NOT DETECTED Final   Klebsiella pneumoniae NOT DETECTED NOT DETECTED Final  Proteus species NOT DETECTED NOT DETECTED Final   Salmonella species NOT DETECTED NOT DETECTED Final   Serratia marcescens NOT DETECTED NOT DETECTED Final   Haemophilus influenzae NOT DETECTED NOT DETECTED Final   Neisseria meningitidis NOT DETECTED NOT DETECTED Final   Pseudomonas aeruginosa NOT DETECTED NOT DETECTED Final   Stenotrophomonas maltophilia NOT DETECTED NOT DETECTED Final   Candida albicans NOT DETECTED NOT DETECTED Final   Candida auris NOT DETECTED NOT DETECTED Final   Candida glabrata NOT DETECTED NOT DETECTED Final   Candida krusei NOT DETECTED NOT DETECTED Final   Candida parapsilosis NOT DETECTED NOT DETECTED Final   Candida tropicalis NOT DETECTED NOT DETECTED Final   Cryptococcus neoformans/gattii NOT DETECTED NOT DETECTED Final   CTX-M ESBL NOT DETECTED NOT DETECTED Final   Carbapenem resistance IMP NOT DETECTED NOT DETECTED Final   Carbapenem resistance KPC NOT DETECTED NOT DETECTED Final   Carbapenem resistance NDM NOT DETECTED NOT DETECTED Final   Carbapenem resist OXA 48 LIKE NOT DETECTED NOT DETECTED Final   Carbapenem resistance VIM NOT DETECTED NOT DETECTED Final    Comment: Performed at [facility], [address], [place], [place] [address]  Blood Culture (routine x 2)     Status: None (Preliminary result)   Collection Time: 06/08/21 10:12 AM   Specimen: BLOOD  Result Value Ref Range Status   Specimen Description BLOOD RIGHT ANTECUBITAL  Final   Special Requests   Final    BOTTLES DRAWN AEROBIC AND ANAEROBIC Blood Culture adequate volume   Culture   Final    NO GROWTH 3 DAYS Performed at [facility], [place] [address], [place], [place] [address]    Report Status PENDING  Incomplete         Radiology Studies: CT ABDOMEN PELVIS W CONTRAST  Result Date: 06/11/2021 CLINICAL DATA:  Pyelonephritis, complicated EXAM: CT ABDOMEN AND PELVIS WITH CONTRAST TECHNIQUE: Multidetector CT imaging of the abdomen and pelvis was performed using the standard protocol following bolus administration of intravenous contrast. RADIATION DOSE REDUCTION: This exam was performed according to the departmental dose-optimization program which includes automated exposure control, adjustment of the mA and/or kV according to patient size and/or use of iterative reconstruction technique. CONTRAST:  157m OMNIPAQUE IOHEXOL 300 MG/ML  SOLN COMPARISON:  CT 06/08/2021 FINDINGS: Lower chest: Right basilar linear atelectasis/scarring. Trace bilateral pleural effusions, right slightly greater than left. Hepatobiliary: No focal liver abnormality is seen. Decompressed gallbladder with nonspecific wall thickening. Pancreas: Unremarkable. No pancreatic ductal dilatation or surrounding inflammatory changes. Spleen: Normal in size without focal abnormality. Adrenals/Urinary Tract: Adrenal glands are unremarkable. Enlarged right kidney with worsening renal parenchymal edema and hypoenhancement in comparison to prior. Persistent perinephric stranding. There is no parenchymal or collecting system gas. There is a suspected developing perirenal phlegmon along the lateral aspect of the mid kidney measuring 1.4 x 0.5 cm (series 3, image 53) another and  inferiorly measuring up to 0.6 cm short axis which may connect with the calyx (series 3, image 55). There are 2 right renal cysts which are unchanged from prior exam. The lesion in the medial posterior kidney noted on the prior exam appears more heterogeneous in comparison and not measurable. Prominence of the right renal collecting system with some urothelial thickening, likely related to the infection. No obstructing lesion identified. Left renal cysts are unchanged. No left hydronephrosis. The bladder is unremarkable. Stomach/Bowel: The stomach is within normal limits. There is no evidence of bowel obstruction.The appendix is normal. Vascular/Lymphatic: Scattered  atherosclerosis. No AAA. Prominent retroperitoneal lymph nodes at the level of the kidneys, likely reactive. Reproductive: Unchanged left-sided Essure micro insert seen at the left uterine tubal junction. Other: Subcutaneous gas in lower anterior abdominal wall likely related to medication injection. No hernia. No free air. Musculoskeletal: No acute or significant osseous findings. IMPRESSION: Worsening, severe diffuse pyelonephritis of the right kidney with suspected developing phlegmonous collections measuring 0.6 cm short axis along the periphery of the mid to lower kidney, but no drainable collection at this time. The indeterminate 3.3 cm renal lesion noted on the prior exam is more heterogeneous and most likely represents an area of focal pyelonephritis. Electronically Signed   By: Maurine Simmering M.D.   On: 06/11/2021 11:51        Scheduled Meds:  enoxaparin (LOVENOX) injection  40 mg Subcutaneous Q24H   gabapentin  100 mg Oral BID   melatonin  5 mg Oral QPM   sertraline  100 mg Oral QPM   sodium chloride flush  3 mL Intravenous Q12H   Continuous Infusions:  cefTRIAXone (ROCEPHIN)  IV Stopped (06/11/21 2117)   lactated ringers 75 mL/hr at 06/11/21 2133     LOS: 4 days    Time spent: 35 minutes    Barb Merino, MD [facility] Pager [phone]

## 2021-06-13 DIAGNOSIS — A419 Sepsis, unspecified organism: Secondary | ICD-10-CM | POA: Diagnosis not present

## 2021-06-13 DIAGNOSIS — N390 Urinary tract infection, site not specified: Secondary | ICD-10-CM | POA: Diagnosis not present

## 2021-06-13 LAB — BASIC METABOLIC PANEL
Anion gap: 5 (ref 5–15)
BUN: 9 mg/dL (ref 6–20)
CO2: 29 mmol/L (ref 22–32)
Calcium: 8.4 mg/dL — ABNORMAL LOW (ref 8.9–10.3)
Chloride: 104 mmol/L (ref 98–111)
Creatinine, Ser: 0.77 mg/dL (ref 0.44–1.00)
GFR, Estimated: >60 mL/min (ref >60)
Glucose, Bld: 118 mg/dL — ABNORMAL HIGH (ref 70–99)
Potassium: 3.5 mmol/L (ref 3.5–5.1)
Sodium: 138 mmol/L (ref 135–145)

## 2021-06-13 LAB — CULTURE, BLOOD (ROUTINE X 2)
Culture: NO GROWTH
Special Requests: ADEQUATE

## 2021-06-13 LAB — CBC WITH DIFFERENTIAL/PLATELET
Abs Immature Granulocytes: 0.67 10*3/uL — ABNORMAL HIGH (ref 0.00–0.07)
Basophils Absolute: 0.1 10*3/uL (ref 0.0–0.1)
Basophils Relative: 1 %
Eosinophils Absolute: 0.4 10*3/uL (ref 0.0–0.5)
Eosinophils Relative: 3 %
HCT: 32.6 % — ABNORMAL LOW (ref 36.0–46.0)
Hemoglobin: 11.1 g/dL — ABNORMAL LOW (ref 12.0–15.0)
Immature Granulocytes: 5 %
Lymphocytes Relative: 30 %
Lymphs Abs: 3.9 10*3/uL (ref 0.7–4.0)
MCH: 30.7 pg (ref 26.0–34.0)
MCHC: 34 g/dL (ref 30.0–36.0)
MCV: 90.1 fL (ref 80.0–100.0)
Monocytes Absolute: 1.2 10*3/uL — ABNORMAL HIGH (ref 0.1–1.0)
Monocytes Relative: 9 %
Neutro Abs: 6.7 10*3/uL (ref 1.7–7.7)
Neutrophils Relative %: 52 %
Platelets: 316 10*3/uL (ref 150–400)
RBC: 3.62 MIL/uL — ABNORMAL LOW (ref 3.87–5.11)
RDW: 13.6 % (ref 11.5–15.5)
WBC: 13 10*3/uL — ABNORMAL HIGH (ref 4.0–10.5)
nRBC: 0 % (ref 0.0–0.2)

## 2021-06-13 LAB — MAGNESIUM: Magnesium: 1.9 mg/dL (ref 1.7–2.4)

## 2021-06-13 MED ORDER — BUTALBITAL-APAP-CAFFEINE 50-325-40 MG PO TABS: 2.0000 | ORAL_TABLET | ORAL | 0 refills | Status: AC | PRN

## 2021-06-13 MED ORDER — LEVOFLOXACIN 750 MG PO TABS
750.0000 mg | ORAL_TABLET | Freq: Every day | ORAL | 0 refills | Status: AC
Start: 2021-06-13 — End: 2021-06-18

## 2021-06-13 MED ORDER — OXYCODONE HCL 5 MG PO TABS: 5.0000 mg | ORAL_TABLET | ORAL | 0 refills | Status: AC | PRN

## 2021-06-13 MED ORDER — SODIUM CHLORIDE 0.9 % IV SOLN
2.0000 g | Freq: Once | INTRAVENOUS | Status: AC
  Administered 2021-06-13: 2 g via INTRAVENOUS
  Filled 2021-06-13: qty 20

## 2021-06-13 NOTE — Plan of Care (Signed)
  Problem: Fluid Volume: Goal: Hemodynamic stability will improve Outcome: Adequate for Discharge   Problem: Clinical Measurements: Goal: Diagnostic test results will improve Outcome: Adequate for Discharge Goal: Signs and symptoms of infection will decrease Outcome: Adequate for Discharge   Problem: Respiratory: Goal: Ability to maintain adequate ventilation will improve Outcome: Adequate for Discharge   Problem: Education: Goal: Knowledge of General Education information will improve Description: Including pain rating scale, medication(s)/side effects and non-pharmacologic comfort measures Outcome: Adequate for Discharge   Problem: Health Behavior/Discharge Planning: Goal: Ability to manage health-related needs will improve Outcome: Adequate for Discharge   Problem: Clinical Measurements: Goal: Will remain free from infection Outcome: Adequate for Discharge   Problem: Activity: Goal: Risk for activity intolerance will decrease Outcome: Adequate for Discharge   Problem: Nutrition: Goal: Adequate nutrition will be maintained Outcome: Adequate for Discharge   Problem: Coping: Goal: Level of anxiety will decrease Outcome: Adequate for Discharge   Problem: Elimination: Goal: Will not experience complications related to bowel motility Outcome: Adequate for Discharge Goal: Will not experience complications related to urinary retention Outcome: Adequate for Discharge   Problem: Pain Managment: Goal: General experience of comfort will improve Outcome: Adequate for Discharge   Problem: Safety: Goal: Ability to remain free from injury will improve Outcome: Adequate for Discharge

## 2021-06-13 NOTE — Plan of Care (Signed)
  Problem: Clinical Measurements: Goal: Diagnostic test results will improve Outcome: Progressing Goal: Signs and symptoms of infection will decrease Outcome: Progressing   Problem: Respiratory: Goal: Ability to maintain adequate ventilation will improve Outcome: Progressing   Problem: Activity: Goal: Risk for activity intolerance will decrease Outcome: Progressing   Problem: Nutrition: Goal: Adequate nutrition will be maintained Outcome: Progressing   Problem: Coping: Goal: Level of anxiety will decrease Outcome: Progressing   Problem: Elimination: Goal: Will not experience complications related to bowel motility Outcome: Progressing Goal: Will not experience complications related to urinary retention Outcome: Progressing

## 2021-06-13 NOTE — TOC Transition Note (Signed)
Transition of Care [facility]) - CM/SW Discharge Note   Patient Details  Name: Claire Caldwell MRN: [id number] Date of Birth: [birth year]  [facility] of [facility]) CM/SW Contact:  Tom-Johnson, Hershal Coria, RN Phone Number: 06/13/2021, 12:29 PM   Clinical Narrative:     Patient is scheduled for discharge today. No TOC needs noted. Denies any needs. Family to transport at discharge. No further TOC needs noted.   Final next level of care: Home/Self Care Barriers to Discharge: Barriers Resolved   Patient Goals and CMS Choice Patient states their goals for this hospitalization and ongoing recovery are:: To return home CMS Medicare.gov Compare Post Acute Care list provided to:: Patient Choice offered to / list presented to : NA  Discharge Placement                Patient to be transferred to facility by: Family      Discharge Plan and Services                DME Arranged: N/A DME Agency: NA       HH Arranged: NA HH Agency: NA        Social Determinants of Health (SDOH) Interventions     Readmission Risk Interventions     No data to display

## 2021-06-13 NOTE — Progress Notes (Signed)
Patient IV removal well tolerated, belongings collected and taken with patient. Discharge instructions explained to patient and her husband, teach back completed. Transported via wheelchair to family car.

## 2021-06-13 NOTE — Discharge Summary (Signed)
Physician Discharge Summary  Claire Caldwell YQI:[id number] DOB: August 10, 1985 DOA: 06/08/2021  PCP: Nolene Ebbs, MD  Admit date: 06/08/2021 Discharge date: 06/13/2021  Admitted From: Home Disposition: Home  Recommendations for Outpatient Follow-up:  Follow up with PCP in 1-2 weeks   Home Health: N/A Equipment/Devices: N/A  Discharge Condition: Stable CODE STATUS: Full code Diet recommendation: Low-salt diet  Discharge summary: 36 year old with a history of HTN and fibromyalgia who presented to the ER with fever up to 103 accompanied by suprapubic and right flank pain.  She has a history of chronic hematuria and proteinuria. CT abdomen and pelvis in the ER was consistent with pyelonephritis as was the patient's UA and an elevated WBC of 27. Blood cultures with pansensitive E. coli, unfortunately urine culture was not done.     Sepsis present on admission secondary to E. coli pyelonephritis with bacteremia: Patient met sepsis criteria on presentation due to tachycardia, elevated WBC count and fever. 6/5 blood cultures positive for E. Coli.  Repeat blood cultures 6/7, negative so far. Urine cultures not done on admission. Currently remains on Rocephin 2 g daily.   Due to persistent fever, CT scan was repeated 6/7 that showed pyelonephritis without complicated abscess. Treated with 6 days of IV therapy in the hospital.  Clinically improved.  Afebrile last 24 hours. With clinical improvement and clearance of bacteremia, will treat with total 10 days of antibiotic therapy.  Levaquin for 5 more days.  Short course of opiates for right flank pain which is significant and using pain medications in the hospital. Patient does have a history of migraine and requested short course of Fioricet therapy that was prescribed. Other chronic medical issues remained stable and she will continue long-term home medications.     Discharge Diagnoses:  Principal Problem:   Sepsis secondary to UTI  [facility]) Active Problems:   Fibromyalgia   Essential hypertension   Hematuria of undiagnosed cause   Hyperglycemia   Pyelonephritis    Discharge Instructions  Discharge Instructions     Call MD for:  severe uncontrolled pain   Complete by: As directed    Call MD for:  temperature >100.4   Complete by: As directed    Diet - low sodium heart healthy   Complete by: As directed    Increase activity slowly   Complete by: As directed       Allergies as of 06/13/2021       Reactions   Mobic [meloxicam] Hives, Itching   Motrin [ibuprofen] Hives, Itching, Rash   Pt seems to be able to take children's Motrin   Nsaids Hives, Itching, Rash   Pt seems to be able to take children's Motrin.   Ultram [tramadol] Hives, Itching, Other (See Comments)   Headache        Medication List     STOP taking these medications    aspirin-acetaminophen-caffeine 250-250-65 MG tablet Commonly known as: EXCEDRIN MIGRAINE       TAKE these medications    amLODipine 10 MG tablet Commonly known as: NORVASC Take 10 mg by mouth daily.   butalbital-acetaminophen-caffeine 50-325-40 MG tablet Commonly known as: FIORICET Take 2 tablets by mouth every 4 (four) hours as needed for headache.   [employer] Melatonin 5 MG Tabs Generic drug: melatonin Take 5 mg by mouth every evening.   gabapentin 100 MG capsule Commonly known as: NEURONTIN Take 100 mg by mouth 2 (two) times daily.   hydrOXYzine 25 MG tablet Commonly known as: ATARAX Take 25 mg by  mouth 2 (two) times daily as needed for anxiety (panic attacks).   ibuprofen 100 MG/5ML suspension Commonly known as: ADVIL Take 300-400 mg by mouth every 8 (eight) hours as needed for mild pain (pain).   levofloxacin 750 MG tablet Commonly known as: Levaquin Take 1 tablet (750 mg total) by mouth daily for 5 days.   oxyCODONE 5 MG immediate release tablet Commonly known as: Oxy IR/ROXICODONE Take 1 tablet (5 mg total) by mouth every 4 (four) hours as  needed for up to 3 days for moderate pain.   sertraline 100 MG tablet Commonly known as: ZOLOFT Take 100 mg by mouth every evening.   Vitamin D (Ergocalciferol) 1.25 MG (50000 UNIT) Caps capsule Commonly known as: DRISDOL Take 50,000 Units by mouth every Wednesday.   zolpidem 10 MG tablet Commonly known as: AMBIEN Take 10 mg by mouth at bedtime as needed for sleep.        Allergies  Allergen Reactions   Mobic [Meloxicam] Hives and Itching   Motrin [Ibuprofen] Hives, Itching and Rash    Pt seems to be able to take children's Motrin   Nsaids Hives, Itching and Rash    Pt seems to be able to take children's Motrin.   Ultram [Tramadol] Hives, Itching and Other (See Comments)    Headache    Consultations: None   Procedures/Studies: CT ABDOMEN PELVIS W CONTRAST  Result Date: 06/11/2021 CLINICAL DATA:  Pyelonephritis, complicated EXAM: CT ABDOMEN AND PELVIS WITH CONTRAST TECHNIQUE: Multidetector CT imaging of the abdomen and pelvis was performed using the standard protocol following bolus administration of intravenous contrast. RADIATION DOSE REDUCTION: This exam was performed according to the departmental dose-optimization program which includes automated exposure control, adjustment of the mA and/or kV according to patient size and/or use of iterative reconstruction technique. CONTRAST:  117m OMNIPAQUE IOHEXOL 300 MG/ML  SOLN COMPARISON:  CT 06/08/2021 FINDINGS: Lower chest: Right basilar linear atelectasis/scarring. Trace bilateral pleural effusions, right slightly greater than left. Hepatobiliary: No focal liver abnormality is seen. Decompressed gallbladder with nonspecific wall thickening. Pancreas: Unremarkable. No pancreatic ductal dilatation or surrounding inflammatory changes. Spleen: Normal in size without focal abnormality. Adrenals/Urinary Tract: Adrenal glands are unremarkable. Enlarged right kidney with worsening renal parenchymal edema and hypoenhancement in comparison to  prior. Persistent perinephric stranding. There is no parenchymal or collecting system gas. There is a suspected developing perirenal phlegmon along the lateral aspect of the mid kidney measuring 1.4 x 0.5 cm (series 3, image 53) another and inferiorly measuring up to 0.6 cm short axis which may connect with the calyx (series 3, image 55). There are 2 right renal cysts which are unchanged from prior exam. The lesion in the medial posterior kidney noted on the prior exam appears more heterogeneous in comparison and not measurable. Prominence of the right renal collecting system with some urothelial thickening, likely related to the infection. No obstructing lesion identified. Left renal cysts are unchanged. No left hydronephrosis. The bladder is unremarkable. Stomach/Bowel: The stomach is within normal limits. There is no evidence of bowel obstruction.The appendix is normal. Vascular/Lymphatic: Scattered atherosclerosis. No AAA. Prominent retroperitoneal lymph nodes at the level of the kidneys, likely reactive. Reproductive: Unchanged left-sided Essure micro insert seen at the left uterine tubal junction. Other: Subcutaneous gas in lower anterior abdominal wall likely related to medication injection. No hernia. No free air. Musculoskeletal: No acute or significant osseous findings. IMPRESSION: Worsening, severe diffuse pyelonephritis of the right kidney with suspected developing phlegmonous collections measuring 0.6 cm short axis along  the periphery of the mid to lower kidney, but no drainable collection at this time. The indeterminate 3.3 cm renal lesion noted on the prior exam is more heterogeneous and most likely represents an area of focal pyelonephritis. Electronically Signed   By: Maurine Simmering M.D.   On: 06/11/2021 11:51   CT ABDOMEN PELVIS W CONTRAST  Result Date: 06/08/2021 CLINICAL DATA:  Right flank and abdominal pain for 4 days. Fevers. Abdominal distention and nausea. Sepsis. EXAM: CT ABDOMEN AND PELVIS  WITH CONTRAST TECHNIQUE: Multidetector CT imaging of the abdomen and pelvis was performed using the standard protocol following bolus administration of intravenous contrast. RADIATION DOSE REDUCTION: This exam was performed according to the departmental dose-optimization program which includes automated exposure control, adjustment of the mA and/or kV according to patient size and/or use of iterative reconstruction technique. CONTRAST:  186m OMNIPAQUE IOHEXOL 300 MG/ML  SOLN COMPARISON:  Noncontrast CT on 05/05/2006 FINDINGS: Lower Chest: No acute findings. Hepatobiliary: No hepatic masses identified. Gallbladder is unremarkable. No evidence of biliary ductal dilatation. Pancreas:  No mass or inflammatory changes. Spleen: Within normal limits in size and appearance. Adrenals/Urinary Tract: Normal adrenal glands. No evidence of ureteral calculi or hydronephrosis. Several benign-appearing renal cysts are noted. Diffuse right renal swelling is seen with multiple small ill-defined areas of decreased contrast enhancement and right perinephric soft tissue stranding. These findings are consistent with pyelonephritis. A 3.3 cm intermediate attenuation lesion is seen in the posterior midpole of the right kidney, which could represent a complex cyst, mass, or less likely abscess. Stomach/Bowel: No evidence of obstruction, inflammatory process or abnormal fluid collections. Vascular/Lymphatic: No pathologically enlarged lymph nodes. No acute vascular findings. Aortic atherosclerotic calcification incidentally noted. Reproductive: No mass or other significant abnormality. Left-sided Essure micro-insert seen at the left uterine tubal junction. Other:  None. Musculoskeletal:  No suspicious bone lesions identified. IMPRESSION: Right-sided pyelonephritis. No evidence of ureteral calculi or hydronephrosis. 3.3 cm indeterminate right renal lesion, with differential diagnosis including complex cyst, mass, or less likely abscess.  Recommendimaging follow-up with renal protocol abdomen MRI or CT without and with contrast in approximately 1 month. Aortic Atherosclerosis (ICD10-I70.0). Electronically Signed   By: JMarlaine HindM.D.   On: 06/08/2021 11:00   DG Chest 1 View  Result Date: 06/08/2021 CLINICAL DATA:  Fever EXAM: CHEST  1 VIEW COMPARISON:  07/04/2020 FINDINGS: The heart size and mediastinal contours are within normal limits. Both lungs are clear. The visualized skeletal structures are unremarkable. IMPRESSION: No active disease. Electronically Signed   By: NDavina PokeD.O.   On: 06/08/2021 10:54   (Echo, Carotid, EGD, Colonoscopy, ERCP)    Subjective: Patient seen and examined.  Still has some right flank pain but much better.  Mobilized around.  Urinating without difficulty.  Afebrile for last 24 hours.  Eager to go home to see her children.  Fianc at the bedside.   Discharge Exam: Vitals:   06/13/21 0544 06/13/21 0847  BP: (!) 147/79 (!) 134/91  Pulse: 65 (!) 56  Resp: 18 20  Temp: 98.8 F (37.1 C) 98.7 F (37.1 C)  SpO2: 98% 97%   Vitals:   06/12/21 1656 06/12/21 2125 06/13/21 0544 06/13/21 0847  BP: (!) 144/93 122/82 (!) 147/79 (!) 134/91  Pulse: 63 65 65 (!) 56  Resp: '18 18 18 20  ' Temp: 98.6 F (37 C) 98.9 F (37.2 C) 98.8 F (37.1 C) 98.7 F (37.1 C)  TempSrc:  Oral Oral Oral  SpO2: 96% 94% 98% 97%  Weight:  Height:        General: Pt is alert, awake, not in acute distress Cardiovascular: RRR, S1/S2 +, no rubs, no gallops Respiratory: CTA bilaterally, no wheezing, no rhonchi Abdominal: Soft, mild tenderness right flank.  No rigidity or guarding.  ND, bowel sounds + Extremities: no edema, no cyanosis    The results of significant diagnostics from this hospitalization (including imaging, microbiology, ancillary and laboratory) are listed below for reference.     Microbiology: Recent Results (from the past 240 hour(s))  Resp Panel by RT-PCR (Flu A&B, Covid)     Status: None    Collection Time: 06/08/21 10:07 AM   Specimen: Nasal Swab  Result Value Ref Range Status   SARS Coronavirus 2 by RT PCR NEGATIVE NEGATIVE Final    Comment: (NOTE) SARS-CoV-2 target nucleic acids are NOT DETECTED.  The SARS-CoV-2 RNA is generally detectable in upper respiratory specimens during the acute phase of infection. The lowest concentration of SARS-CoV-2 viral copies this assay can detect is 138 copies/mL. A negative result does not preclude SARS-Cov-2 infection and should not be used as the sole basis for treatment or other patient management decisions. A negative result may occur with  improper specimen collection/handling, submission of specimen other than nasopharyngeal swab, presence of viral mutation(s) within the areas targeted by this assay, and inadequate number of viral copies(<138 copies/mL). A negative result must be combined with clinical observations, patient history, and epidemiological information. The expected result is Negative.  Fact Sheet for Patients:  EntrepreneurPulse.com.au  Fact Sheet for Healthcare Providers:  IncredibleEmployment.be  This test is no t yet approved or cleared by the [place] FDA and  has been authorized for detection and/or diagnosis of SARS-CoV-2 by FDA under an Emergency Use Authorization (EUA). This EUA will remain  in effect (meaning this test can be used) for the duration of the COVID-19 declaration under Section 564(b)(1) of the Act, 21 U.S.C.section 360bbb-3(b)(1), unless the authorization is terminated  or revoked sooner.       Influenza A by PCR NEGATIVE NEGATIVE Final   Influenza B by PCR NEGATIVE NEGATIVE Final    Comment: (NOTE) The Xpert Xpress SARS-CoV-2/FLU/RSV plus assay is intended as an aid in the diagnosis of influenza from Nasopharyngeal swab specimens and should not be used as a sole basis for treatment. Nasal washings and aspirates are unacceptable for Xpert  Xpress SARS-CoV-2/FLU/RSV testing.  Fact Sheet for Patients: EntrepreneurPulse.com.au  Fact Sheet for Healthcare Providers: IncredibleEmployment.be  This test is not yet approved or cleared by the [place] FDA and has been authorized for detection and/or diagnosis of SARS-CoV-2 by FDA under an Emergency Use Authorization (EUA). This EUA will remain in effect (meaning this test can be used) for the duration of the COVID-19 declaration under Section 564(b)(1) of the Act, 21 U.S.C. section 360bbb-3(b)(1), unless the authorization is terminated or revoked.  Performed at [facility], [place] [address] [place], [place] [address]   Blood Culture (routine x 2)     Status: Abnormal   Collection Time: 06/08/21 10:07 AM   Specimen: BLOOD  Result Value Ref Range Status   Specimen Description BLOOD SITE NOT SPECIFIED  Final   Special Requests   Final    BOTTLES DRAWN AEROBIC AND ANAEROBIC Blood Culture adequate volume   Culture  Setup Time   Final    GRAM NEGATIVE RODS ANAEROBIC BOTTLE ONLY CRITICAL RESULT CALLED TO, READ BACK BY AND VERIFIED WITH: PHARMD E.MARTIN AT 1000 ON 06/09/2021 BY T.SAAD. Performed  at [facility], [place] [address] [place], [place] [address]    Culture ESCHERICHIA COLI (A)  Final   Report Status 06/11/2021 FINAL  Final   Organism ID, Bacteria ESCHERICHIA COLI  Final      Susceptibility   Escherichia coli - MIC*    AMPICILLIN <=2 SENSITIVE Sensitive     CEFAZOLIN <=4 SENSITIVE Sensitive     CEFEPIME <=0.12 SENSITIVE Sensitive     CEFTAZIDIME <=1 SENSITIVE Sensitive     CEFTRIAXONE <=0.25 SENSITIVE Sensitive     CIPROFLOXACIN <=0.25 SENSITIVE Sensitive     GENTAMICIN <=1 SENSITIVE Sensitive     IMIPENEM <=0.25 SENSITIVE Sensitive     TRIMETH/SULFA <=20 SENSITIVE Sensitive     AMPICILLIN/SULBACTAM <=2 SENSITIVE Sensitive     PIP/TAZO <=4 SENSITIVE Sensitive     * ESCHERICHIA COLI  Blood Culture ID Panel  (Reflexed)     Status: Abnormal   Collection Time: 06/08/21 10:07 AM  Result Value Ref Range Status   Enterococcus faecalis NOT DETECTED NOT DETECTED Final   Enterococcus Faecium NOT DETECTED NOT DETECTED Final   Listeria monocytogenes NOT DETECTED NOT DETECTED Final   Staphylococcus species NOT DETECTED NOT DETECTED Final   Staphylococcus aureus (BCID) NOT DETECTED NOT DETECTED Final   Staphylococcus epidermidis NOT DETECTED NOT DETECTED Final   Staphylococcus lugdunensis NOT DETECTED NOT DETECTED Final   Streptococcus species NOT DETECTED NOT DETECTED Final   Streptococcus agalactiae NOT DETECTED NOT DETECTED Final   Streptococcus pneumoniae NOT DETECTED NOT DETECTED Final   Streptococcus pyogenes NOT DETECTED NOT DETECTED Final   A.calcoaceticus-baumannii NOT DETECTED NOT DETECTED Final   Bacteroides fragilis NOT DETECTED NOT DETECTED Final   Enterobacterales DETECTED (A) NOT DETECTED Final    Comment: Enterobacterales represent a large order of gram negative bacteria, not a single organism. CRITICAL RESULT CALLED TO, READ BACK BY AND VERIFIED WITH: PHARMD E.MARTIN AT 1000 ON 06/09/2021 BY T.SAAD.    Enterobacter cloacae complex NOT DETECTED NOT DETECTED Final   Escherichia coli DETECTED (A) NOT DETECTED Final    Comment: CRITICAL RESULT CALLED TO, READ BACK BY AND VERIFIED WITH: PHARMD E.MARTIN AT 1000 ON 06/09/2021 BY T.SAAD.    Klebsiella aerogenes NOT DETECTED NOT DETECTED Final   Klebsiella oxytoca NOT DETECTED NOT DETECTED Final   Klebsiella pneumoniae NOT DETECTED NOT DETECTED Final   Proteus species NOT DETECTED NOT DETECTED Final   Salmonella species NOT DETECTED NOT DETECTED Final   Serratia marcescens NOT DETECTED NOT DETECTED Final   Haemophilus influenzae NOT DETECTED NOT DETECTED Final   Neisseria meningitidis NOT DETECTED NOT DETECTED Final   Pseudomonas aeruginosa NOT DETECTED NOT DETECTED Final   Stenotrophomonas maltophilia NOT DETECTED NOT DETECTED Final    Candida albicans NOT DETECTED NOT DETECTED Final   Candida auris NOT DETECTED NOT DETECTED Final   Candida glabrata NOT DETECTED NOT DETECTED Final   Candida krusei NOT DETECTED NOT DETECTED Final   Candida parapsilosis NOT DETECTED NOT DETECTED Final   Candida tropicalis NOT DETECTED NOT DETECTED Final   Cryptococcus neoformans/gattii NOT DETECTED NOT DETECTED Final   CTX-M ESBL NOT DETECTED NOT DETECTED Final   Carbapenem resistance IMP NOT DETECTED NOT DETECTED Final   Carbapenem resistance KPC NOT DETECTED NOT DETECTED Final   Carbapenem resistance NDM NOT DETECTED NOT DETECTED Final   Carbapenem resist OXA 48 LIKE NOT DETECTED NOT DETECTED Final   Carbapenem resistance VIM NOT DETECTED NOT DETECTED Final    Comment: Performed at [facility], [address] [place], [place] [address]  Blood Culture (routine x 2)     Status: None   Collection Time: 06/08/21 10:12 AM   Specimen: BLOOD  Result Value Ref Range Status   Specimen Description BLOOD RIGHT ANTECUBITAL  Final   Special Requests   Final    BOTTLES DRAWN AEROBIC AND ANAEROBIC Blood Culture adequate volume   Culture   Final    NO GROWTH 5 DAYS Performed at [facility], [place] [address] [place], [place] [address]    Report Status 06/13/2021 FINAL  Final  Culture, blood (Routine X 2) w Reflex to ID Panel     Status: None (Preliminary result)   Collection Time: 06/12/21  4:03 AM   Specimen: BLOOD RIGHT HAND  Result Value Ref Range Status   Specimen Description BLOOD RIGHT HAND  Final   Special Requests   Final    BOTTLES DRAWN AEROBIC ONLY Blood Culture adequate volume   Culture   Final    NO GROWTH 1 DAY Performed at [facility], [place] [address] [place], [place] [address]    Report Status PENDING  Incomplete  Culture, blood (Routine X 2) w Reflex to ID Panel     Status: None (Preliminary result)   Collection Time: 06/12/21  4:05 AM   Specimen: BLOOD LEFT HAND  Result Value Ref Range Status    Specimen Description BLOOD LEFT HAND  Final   Special Requests   Final    BOTTLES DRAWN AEROBIC AND ANAEROBIC Blood Culture adequate volume   Culture   Final    NO GROWTH 1 DAY Performed at [facility], [place] [address], [place], [place] [address]    Report Status PENDING  Incomplete     Labs: BNP (last 3 results) No results for input(s): "BNP" in the last 8760 hours. Basic Metabolic Panel: Recent Labs  Lab 06/09/21 0341 06/10/21 0552 06/10/21 1326 06/11/21 0652 06/13/21 0354  NA 136 137 137 136 138  K 3.0* 2.3* 2.8* 3.5 3.5  CL 105 103 103 99 104  CO2 '23 26 27 28 29  ' GLUCOSE 110* 185* 144* 109* 118*  BUN '11 7 7 ' <5* 9  CREATININE 0.97 0.90 0.99 0.85 0.77  CALCIUM 7.4* 8.1* 8.2* 8.4* 8.4*  MG  --  1.8  --   --  1.9   Liver Function Tests: Recent Labs  Lab 06/08/21 0900 06/10/21 0552 06/11/21 0652  AST '23 20 20  ' ALT '25 20 21  ' ALKPHOS 84 72 115  BILITOT 0.8 0.6 0.8  PROT 6.9 5.3* 5.7*  ALBUMIN 3.1* 2.1* 2.1*   Recent Labs  Lab 06/08/21 0900  LIPASE 27   No results for input(s): "AMMONIA" in the last 168 hours. CBC: Recent Labs  Lab 06/08/21 0900 06/09/21 0341 06/10/21 0552 06/11/21 0652 06/13/21 0354  WBC 27.7* 19.7* 14.7* 15.5* 13.0*  NEUTROABS  --   --   --   --  6.7  HGB 13.6 10.2* 9.9* 10.6* 11.1*  HCT 39.0 29.8* 28.8* 30.8* 32.6*  MCV 89.0 90.3 87.8 88.3 90.1  PLT 247 173 181 196 316   Cardiac Enzymes: No results for input(s): "CKTOTAL", "CKMB", "CKMBINDEX", "TROPONINI" in the last 168 hours. BNP: Invalid input(s): "POCBNP" CBG: No results for input(s): "GLUCAP" in the last 168 hours. D-Dimer No results for input(s): "DDIMER" in the last 72 hours. Hgb A1c No results for input(s): "HGBA1C" in the last 72 hours. Lipid Profile No results for input(s): "CHOL", "HDL", "  [place]", "TRIG", "CHOLHDL", "LDLDIRECT" in the last 72 hours. Thyroid function studies No results for input(s): "TSH", "T4TOTAL", "T3FREE", "THYROIDAB" in the last 72  hours.  Invalid input(s): "FREET3" Anemia work up No results for input(s): "VITAMINB12", "FOLATE", "FERRITIN", "TIBC", "IRON", "RETICCTPCT" in the last 72 hours. Urinalysis    Component Value Date/Time   COLORURINE AMBER (A) 06/08/2021 0915   APPEARANCEUR CLOUDY (A) 06/08/2021 0915   LABSPEC 1.015 06/08/2021 0915   PHURINE 5.0 06/08/2021 0915   GLUCOSEU NEGATIVE 06/08/2021 0915   HGBUR LARGE (A) 06/08/2021 0915   BILIRUBINUR NEGATIVE 06/08/2021 0915   KETONESUR NEGATIVE 06/08/2021 0915   PROTEINUR 100 (A) 06/08/2021 0915   UROBILINOGEN 0.2 08/29/2012 1638   NITRITE NEGATIVE 06/08/2021 0915   LEUKOCYTESUR LARGE (A) 06/08/2021 0915   Sepsis Labs Recent Labs  Lab 06/09/21 0341 06/10/21 0552 06/11/21 0652 06/13/21 0354  WBC 19.7* 14.7* 15.5* 13.0*   Microbiology Recent Results (from the past 240 hour(s))  Resp Panel by RT-PCR (Flu A&B, Covid)     Status: None   Collection Time: 06/08/21 10:07 AM   Specimen: Nasal Swab  Result Value Ref Range Status   SARS Coronavirus 2 by RT PCR NEGATIVE NEGATIVE Final    Comment: (NOTE) SARS-CoV-2 target nucleic acids are NOT DETECTED.  The SARS-CoV-2 RNA is generally detectable in upper respiratory specimens during the acute phase of infection. The lowest concentration of SARS-CoV-2 viral copies this assay can detect is 138 copies/mL. A negative result does not preclude SARS-Cov-2 infection and should not be used as the sole basis for treatment or other patient management decisions. A negative result may occur with  improper specimen collection/handling, submission of specimen other than nasopharyngeal swab, presence of viral mutation(s) within the areas targeted by this assay, and inadequate number of viral copies(<138 copies/mL). A negative result must be combined with clinical observations, patient history, and epidemiological information. The expected result is Negative.  Fact Sheet for Patients:   EntrepreneurPulse.com.au  Fact Sheet for Healthcare Providers:  IncredibleEmployment.be  This test is no t yet approved or cleared by the [place] FDA and  has been authorized for detection and/or diagnosis of SARS-CoV-2 by FDA under an Emergency Use Authorization (EUA). This EUA will remain  in effect (meaning this test can be used) for the duration of the COVID-19 declaration under Section 564(b)(1) of the Act, 21 U.S.C.section 360bbb-3(b)(1), unless the authorization is terminated  or revoked sooner.       Influenza A by PCR NEGATIVE NEGATIVE Final   Influenza B by PCR NEGATIVE NEGATIVE Final    Comment: (NOTE) The Xpert Xpress SARS-CoV-2/FLU/RSV plus assay is intended as an aid in the diagnosis of influenza from Nasopharyngeal swab specimens and should not be used as a sole basis for treatment. Nasal washings and aspirates are unacceptable for Xpert Xpress SARS-CoV-2/FLU/RSV testing.  Fact Sheet for Patients: EntrepreneurPulse.com.au  Fact Sheet for Healthcare Providers: IncredibleEmployment.be  This test is not yet approved or cleared by the [place] FDA and has been authorized for detection and/or diagnosis of SARS-CoV-2 by FDA under an Emergency Use Authorization (EUA). This EUA will remain in effect (meaning this test can be used) for the duration of the COVID-19 declaration under Section 564(b)(1) of the Act, 21 U.S.C. section 360bbb-3(b)(1), unless the authorization is terminated or revoked.  Performed at [facility], [place] [address], [place], [place] [address]   Blood Culture (routine x 2)     Status: Abnormal   Collection Time: 06/08/21 10:07 AM  Specimen: BLOOD  Result Value Ref Range Status   Specimen Description BLOOD SITE NOT SPECIFIED  Final   Special Requests   Final    BOTTLES DRAWN AEROBIC AND ANAEROBIC Blood Culture adequate volume   Culture  Setup Time   Final     GRAM NEGATIVE RODS ANAEROBIC BOTTLE ONLY CRITICAL RESULT CALLED TO, READ BACK BY AND VERIFIED WITH: PHARMD E.MARTIN AT 1000 ON 06/09/2021 BY T.SAAD. Performed at [facility], [place] [address] [place], [place] [address]    Culture ESCHERICHIA COLI (A)  Final   Report Status 06/11/2021 FINAL  Final   Organism ID, Bacteria ESCHERICHIA COLI  Final      Susceptibility   Escherichia coli - MIC*    AMPICILLIN <=2 SENSITIVE Sensitive     CEFAZOLIN <=4 SENSITIVE Sensitive     CEFEPIME <=0.12 SENSITIVE Sensitive     CEFTAZIDIME <=1 SENSITIVE Sensitive     CEFTRIAXONE <=0.25 SENSITIVE Sensitive     CIPROFLOXACIN <=0.25 SENSITIVE Sensitive     GENTAMICIN <=1 SENSITIVE Sensitive     IMIPENEM <=0.25 SENSITIVE Sensitive     TRIMETH/SULFA <=20 SENSITIVE Sensitive     AMPICILLIN/SULBACTAM <=2 SENSITIVE Sensitive     PIP/TAZO <=4 SENSITIVE Sensitive     * ESCHERICHIA COLI  Blood Culture ID Panel (Reflexed)     Status: Abnormal   Collection Time: 06/08/21 10:07 AM  Result Value Ref Range Status   Enterococcus faecalis NOT DETECTED NOT DETECTED Final   Enterococcus Faecium NOT DETECTED NOT DETECTED Final   Listeria monocytogenes NOT DETECTED NOT DETECTED Final   Staphylococcus species NOT DETECTED NOT DETECTED Final   Staphylococcus aureus (BCID) NOT DETECTED NOT DETECTED Final   Staphylococcus epidermidis NOT DETECTED NOT DETECTED Final   Staphylococcus lugdunensis NOT DETECTED NOT DETECTED Final   Streptococcus species NOT DETECTED NOT DETECTED Final   Streptococcus agalactiae NOT DETECTED NOT DETECTED Final   Streptococcus pneumoniae NOT DETECTED NOT DETECTED Final   Streptococcus pyogenes NOT DETECTED NOT DETECTED Final   A.calcoaceticus-baumannii NOT DETECTED NOT DETECTED Final   Bacteroides fragilis NOT DETECTED NOT DETECTED Final   Enterobacterales DETECTED (A) NOT DETECTED Final    Comment: Enterobacterales represent a large order of gram negative bacteria, not a single  organism. CRITICAL RESULT CALLED TO, READ BACK BY AND VERIFIED WITH: PHARMD E.MARTIN AT 1000 ON 06/09/2021 BY T.SAAD.    Enterobacter cloacae complex NOT DETECTED NOT DETECTED Final   Escherichia coli DETECTED (A) NOT DETECTED Final    Comment: CRITICAL RESULT CALLED TO, READ BACK BY AND VERIFIED WITH: PHARMD E.MARTIN AT 1000 ON 06/09/2021 BY T.SAAD.    Klebsiella aerogenes NOT DETECTED NOT DETECTED Final   Klebsiella oxytoca NOT DETECTED NOT DETECTED Final   Klebsiella pneumoniae NOT DETECTED NOT DETECTED Final   Proteus species NOT DETECTED NOT DETECTED Final   Salmonella species NOT DETECTED NOT DETECTED Final   Serratia marcescens NOT DETECTED NOT DETECTED Final   Haemophilus influenzae NOT DETECTED NOT DETECTED Final   Neisseria meningitidis NOT DETECTED NOT DETECTED Final   Pseudomonas aeruginosa NOT DETECTED NOT DETECTED Final   Stenotrophomonas maltophilia NOT DETECTED NOT DETECTED Final   Candida albicans NOT DETECTED NOT DETECTED Final   Candida auris NOT DETECTED NOT DETECTED Final   Candida glabrata NOT DETECTED NOT DETECTED Final   Candida krusei NOT DETECTED NOT DETECTED Final   Candida parapsilosis NOT DETECTED NOT DETECTED Final   Candida tropicalis NOT DETECTED NOT DETECTED Final   Cryptococcus neoformans/gattii NOT DETECTED NOT DETECTED  Final   CTX-M ESBL NOT DETECTED NOT DETECTED Final   Carbapenem resistance IMP NOT DETECTED NOT DETECTED Final   Carbapenem resistance KPC NOT DETECTED NOT DETECTED Final   Carbapenem resistance NDM NOT DETECTED NOT DETECTED Final   Carbapenem resist OXA 48 LIKE NOT DETECTED NOT DETECTED Final   Carbapenem resistance VIM NOT DETECTED NOT DETECTED Final    Comment: Performed at [facility], [place] [address], [place], [place] [address]  Blood Culture (routine x 2)     Status: None   Collection Time: 06/08/21 10:12 AM   Specimen: BLOOD  Result Value Ref Range Status   Specimen Description BLOOD RIGHT ANTECUBITAL  Final    Special Requests   Final    BOTTLES DRAWN AEROBIC AND ANAEROBIC Blood Culture adequate volume   Culture   Final    NO GROWTH 5 DAYS Performed at [facility], [place] [address], [place], [place] [address]    Report Status 06/13/2021 FINAL  Final  Culture, blood (Routine X 2) w Reflex to ID Panel     Status: None (Preliminary result)   Collection Time: 06/12/21  4:03 AM   Specimen: BLOOD RIGHT HAND  Result Value Ref Range Status   Specimen Description BLOOD RIGHT HAND  Final   Special Requests   Final    BOTTLES DRAWN AEROBIC ONLY Blood Culture adequate volume   Culture   Final    NO GROWTH 1 DAY Performed at [facility], [place] [address], [place], [place] [address]    Report Status PENDING  Incomplete  Culture, blood (Routine X 2) w Reflex to ID Panel     Status: None (Preliminary result)   Collection Time: 06/12/21  4:05 AM   Specimen: BLOOD LEFT HAND  Result Value Ref Range Status   Specimen Description BLOOD LEFT HAND  Final   Special Requests   Final    BOTTLES DRAWN AEROBIC AND ANAEROBIC Blood Culture adequate volume   Culture   Final    NO GROWTH 1 DAY Performed at [facility], [place] [address] [place], [place] [address]    Report Status PENDING  Incomplete     Time coordinating discharge:  32 minutes  SIGNED:   Barb Merino, MD  [facility] 06/13/2021, 2:40 PM

## 2021-06-13 NOTE — Progress Notes (Signed)
DISCHARGE NOTE HOME Claire Caldwell to be discharged Home per MD order. Discussed prescriptions and follow up appointments with the patient. Prescriptions given to patient; medication list explained in detail. Patient verbalized understanding.  Skin clean, dry and intact without evidence of skin break down, no evidence of skin tears noted. IV catheter discontinued intact. Site without signs and symptoms of complications. Dressing and pressure applied. Pt denies pain at the site currently. No complaints noted.  Patient free of lines, drains, and wounds.   An After Visit Summary (AVS) was printed and given to the patient. Patient escorted via wheelchair, and discharged home via private auto.  Berneta Levins, RN

## 2021-06-17 LAB — CULTURE, BLOOD (ROUTINE X 2)
Culture: NO GROWTH
Culture: NO GROWTH
Special Requests: ADEQUATE
Special Requests: ADEQUATE

## 2021-07-03 ENCOUNTER — Other Ambulatory Visit: Payer: Self-pay | Admitting: Internal Medicine

## 2021-07-03 DIAGNOSIS — Z131 Encounter for screening for diabetes mellitus: Secondary | ICD-10-CM | POA: Diagnosis not present

## 2021-07-03 DIAGNOSIS — Z0000 Encounter for general adult medical examination without abnormal findings: Secondary | ICD-10-CM | POA: Diagnosis not present

## 2021-07-03 DIAGNOSIS — Z13220 Encounter for screening for lipoid disorders: Secondary | ICD-10-CM | POA: Diagnosis not present

## 2021-07-03 DIAGNOSIS — M25551 Pain in right hip: Secondary | ICD-10-CM | POA: Diagnosis not present

## 2021-07-03 DIAGNOSIS — E559 Vitamin D deficiency, unspecified: Secondary | ICD-10-CM | POA: Diagnosis not present

## 2021-07-03 DIAGNOSIS — I10 Essential (primary) hypertension: Secondary | ICD-10-CM | POA: Diagnosis not present

## 2021-07-03 DIAGNOSIS — B379 Candidiasis, unspecified: Secondary | ICD-10-CM | POA: Diagnosis not present

## 2021-07-03 DIAGNOSIS — N10 Acute tubulo-interstitial nephritis: Secondary | ICD-10-CM | POA: Diagnosis not present

## 2021-07-04 LAB — LIPID PANEL
Cholesterol: 260 mg/dL — ABNORMAL HIGH (ref <200)
HDL: 69 mg/dL (ref >=50)
LDL Cholesterol (Calc): 151 mg/dL (calc) — ABNORMAL HIGH
Non-HDL Cholesterol (Calc): 191 mg/dL (calc) — ABNORMAL HIGH (ref <130)
Total CHOL/HDL Ratio: 3.8 (calc) (ref <5.0)
Triglycerides: 238 mg/dL — ABNORMAL HIGH (ref <150)

## 2021-07-04 LAB — CBC WITH DIFFERENTIAL/PLATELET
Absolute Monocytes: 428 cells/uL (ref 200–950)
Basophils Absolute: 112 cells/uL (ref 0–200)
Basophils Relative: 1.2 %
Eosinophils Absolute: 298 cells/uL (ref 15–500)
Eosinophils Relative: 3.2 %
HCT: 40.5 % (ref 35.0–45.0)
Hemoglobin: 13.5 g/dL (ref 11.7–15.5)
Lymphs Abs: 4064 cells/uL — ABNORMAL HIGH (ref 850–3900)
MCH: 30.5 pg (ref 27.0–33.0)
MCHC: 33.3 g/dL (ref 32.0–36.0)
MCV: 91.6 fL (ref 80.0–100.0)
MPV: 11 fL (ref 7.5–12.5)
Monocytes Relative: 4.6 %
Neutro Abs: 4399 cells/uL (ref 1500–7800)
Neutrophils Relative %: 47.3 %
Platelets: 305 10*3/uL (ref 140–400)
RBC: 4.42 10*6/uL (ref 3.80–5.10)
RDW: 12.9 % (ref 11.0–15.0)
Total Lymphocyte: 43.7 %
WBC: 9.3 10*3/uL (ref 3.8–10.8)

## 2021-07-04 LAB — BASIC METABOLIC PANEL WITH GFR
BUN: 10 mg/dL (ref 7–25)
CO2: 20 mmol/L (ref 20–32)
Calcium: 9.2 mg/dL (ref 8.6–10.2)
Chloride: 104 mmol/L (ref 98–110)
Creat: 0.53 mg/dL (ref 0.50–0.97)
Glucose, Bld: 111 mg/dL — ABNORMAL HIGH (ref 65–99)
Potassium: 3.7 mmol/L (ref 3.5–5.3)
Sodium: 137 mmol/L (ref 135–146)
eGFR: 124 mL/min/{1.73_m2} (ref >=60)

## 2021-07-04 LAB — URINE CULTURE
MICRO NUMBER:: 13589101
Result:: NO GROWTH
SPECIMEN QUALITY:: ADEQUATE

## 2021-07-04 LAB — TSH: TSH: 1.01 mIU/L

## 2021-07-04 LAB — VITAMIN D 25 HYDROXY (VIT D DEFICIENCY, FRACTURES): Vit D, 25-Hydroxy: 28 ng/mL — ABNORMAL LOW (ref 30–100)

## 2021-07-05 DIAGNOSIS — Z419 Encounter for procedure for purposes other than remedying health state, unspecified: Secondary | ICD-10-CM | POA: Diagnosis not present

## 2021-07-22 DIAGNOSIS — N10 Acute tubulo-interstitial nephritis: Secondary | ICD-10-CM | POA: Diagnosis not present

## 2021-08-05 DIAGNOSIS — Z419 Encounter for procedure for purposes other than remedying health state, unspecified: Secondary | ICD-10-CM | POA: Diagnosis not present

## 2021-09-05 DIAGNOSIS — Z419 Encounter for procedure for purposes other than remedying health state, unspecified: Secondary | ICD-10-CM | POA: Diagnosis not present

## 2021-10-05 DIAGNOSIS — Z419 Encounter for procedure for purposes other than remedying health state, unspecified: Secondary | ICD-10-CM | POA: Diagnosis not present

## 2021-11-05 DIAGNOSIS — Z419 Encounter for procedure for purposes other than remedying health state, unspecified: Secondary | ICD-10-CM | POA: Diagnosis not present

## 2021-11-19 ENCOUNTER — Other Ambulatory Visit: Payer: Self-pay | Admitting: Obstetrics & Gynecology

## 2021-11-19 DIAGNOSIS — R102 Pelvic and perineal pain: Secondary | ICD-10-CM

## 2021-12-05 DIAGNOSIS — Z419 Encounter for procedure for purposes other than remedying health state, unspecified: Secondary | ICD-10-CM | POA: Diagnosis not present

## 2022-01-05 DIAGNOSIS — Z419 Encounter for procedure for purposes other than remedying health state, unspecified: Secondary | ICD-10-CM | POA: Diagnosis not present

## 2022-02-05 DIAGNOSIS — Z419 Encounter for procedure for purposes other than remedying health state, unspecified: Secondary | ICD-10-CM | POA: Diagnosis not present

## 2022-03-06 DIAGNOSIS — Z419 Encounter for procedure for purposes other than remedying health state, unspecified: Secondary | ICD-10-CM | POA: Diagnosis not present

## 2022-04-06 DIAGNOSIS — Z419 Encounter for procedure for purposes other than remedying health state, unspecified: Secondary | ICD-10-CM | POA: Diagnosis not present

## 2022-05-06 DIAGNOSIS — Z419 Encounter for procedure for purposes other than remedying health state, unspecified: Secondary | ICD-10-CM | POA: Diagnosis not present

## 2022-06-06 DIAGNOSIS — Z419 Encounter for procedure for purposes other than remedying health state, unspecified: Secondary | ICD-10-CM | POA: Diagnosis not present

## 2022-07-06 DIAGNOSIS — Z419 Encounter for procedure for purposes other than remedying health state, unspecified: Secondary | ICD-10-CM | POA: Diagnosis not present

## 2022-08-03 DIAGNOSIS — I10 Essential (primary) hypertension: Secondary | ICD-10-CM | POA: Diagnosis not present

## 2022-08-03 DIAGNOSIS — F419 Anxiety disorder, unspecified: Secondary | ICD-10-CM | POA: Diagnosis not present

## 2022-08-03 DIAGNOSIS — M609 Myositis, unspecified: Secondary | ICD-10-CM | POA: Diagnosis not present

## 2022-08-03 DIAGNOSIS — G43909 Migraine, unspecified, not intractable, without status migrainosus: Secondary | ICD-10-CM | POA: Diagnosis not present

## 2022-08-06 DIAGNOSIS — Z419 Encounter for procedure for purposes other than remedying health state, unspecified: Secondary | ICD-10-CM | POA: Diagnosis not present

## 2022-08-17 ENCOUNTER — Other Ambulatory Visit: Payer: Self-pay | Admitting: Internal Medicine

## 2022-08-17 DIAGNOSIS — Z131 Encounter for screening for diabetes mellitus: Secondary | ICD-10-CM | POA: Diagnosis not present

## 2022-08-17 DIAGNOSIS — I10 Essential (primary) hypertension: Secondary | ICD-10-CM | POA: Diagnosis not present

## 2022-08-17 DIAGNOSIS — K219 Gastro-esophageal reflux disease without esophagitis: Secondary | ICD-10-CM | POA: Diagnosis not present

## 2022-08-17 DIAGNOSIS — N039 Chronic nephritic syndrome with unspecified morphologic changes: Secondary | ICD-10-CM | POA: Diagnosis not present

## 2022-08-17 DIAGNOSIS — F419 Anxiety disorder, unspecified: Secondary | ICD-10-CM | POA: Diagnosis not present

## 2022-08-17 DIAGNOSIS — Z0000 Encounter for general adult medical examination without abnormal findings: Secondary | ICD-10-CM | POA: Diagnosis not present

## 2022-08-17 DIAGNOSIS — Z13220 Encounter for screening for lipoid disorders: Secondary | ICD-10-CM | POA: Diagnosis not present

## 2022-08-17 DIAGNOSIS — E559 Vitamin D deficiency, unspecified: Secondary | ICD-10-CM | POA: Diagnosis not present

## 2022-08-17 DIAGNOSIS — G43909 Migraine, unspecified, not intractable, without status migrainosus: Secondary | ICD-10-CM | POA: Diagnosis not present

## 2022-08-21 DIAGNOSIS — K219 Gastro-esophageal reflux disease without esophagitis: Secondary | ICD-10-CM | POA: Diagnosis not present

## 2022-08-21 DIAGNOSIS — N200 Calculus of kidney: Secondary | ICD-10-CM | POA: Diagnosis not present

## 2022-08-21 DIAGNOSIS — N281 Cyst of kidney, acquired: Secondary | ICD-10-CM | POA: Diagnosis not present

## 2022-09-06 DIAGNOSIS — Z419 Encounter for procedure for purposes other than remedying health state, unspecified: Secondary | ICD-10-CM | POA: Diagnosis not present

## 2022-09-23 DIAGNOSIS — G43909 Migraine, unspecified, not intractable, without status migrainosus: Secondary | ICD-10-CM | POA: Diagnosis not present

## 2022-09-23 DIAGNOSIS — M609 Myositis, unspecified: Secondary | ICD-10-CM | POA: Diagnosis not present

## 2022-09-23 DIAGNOSIS — I10 Essential (primary) hypertension: Secondary | ICD-10-CM | POA: Diagnosis not present

## 2022-09-23 DIAGNOSIS — U071 COVID-19: Secondary | ICD-10-CM | POA: Diagnosis not present

## 2022-09-23 DIAGNOSIS — E7849 Other hyperlipidemia: Secondary | ICD-10-CM | POA: Diagnosis not present

## 2022-10-06 DIAGNOSIS — Z419 Encounter for procedure for purposes other than remedying health state, unspecified: Secondary | ICD-10-CM | POA: Diagnosis not present

## 2022-11-06 DIAGNOSIS — Z419 Encounter for procedure for purposes other than remedying health state, unspecified: Secondary | ICD-10-CM | POA: Diagnosis not present

## 2022-11-10 ENCOUNTER — Ambulatory Visit (HOSPITAL_COMMUNITY): Payer: Medicaid Other

## 2022-11-10 ENCOUNTER — Encounter (HOSPITAL_COMMUNITY): Payer: Self-pay | Admitting: *Deleted

## 2022-11-10 ENCOUNTER — Ambulatory Visit (HOSPITAL_COMMUNITY)
Admission: EM | Admit: 2022-11-10 | Discharge: 2022-11-10 | Disposition: A | Discharge status: Home / Self Care | Payer: Medicaid Other | Attending: Family Medicine | Admitting: Family Medicine

## 2022-11-10 DIAGNOSIS — J208 Acute bronchitis due to other specified organisms: Secondary | ICD-10-CM

## 2022-11-10 DIAGNOSIS — J209 Acute bronchitis, unspecified: Secondary | ICD-10-CM

## 2022-11-10 DIAGNOSIS — L0291 Cutaneous abscess, unspecified: Secondary | ICD-10-CM | POA: Diagnosis not present

## 2022-11-10 DIAGNOSIS — R059 Cough, unspecified: Secondary | ICD-10-CM | POA: Diagnosis not present

## 2022-11-10 LAB — POCT INFLUENZA A/B
Influenza A, POC: NEGATIVE
Influenza B, POC: NEGATIVE

## 2022-11-10 MED ORDER — LIDOCAINE HCL (PF) 1 % IJ SOLN
INTRAMUSCULAR | Status: AC
  Filled 2022-11-10: qty 2

## 2022-11-10 MED ORDER — DOXYCYCLINE HYCLATE 100 MG PO CAPS: 100.0000 mg | ORAL_CAPSULE | Freq: Two times a day (BID) | ORAL | 0 refills | Status: AC

## 2022-11-10 MED ORDER — PROMETHAZINE-PHENYLEPHRINE 6.25-5 MG/5ML PO SYRP: 5.0000 mL | ORAL_SOLUTION | ORAL | 0 refills | Status: AC | PRN

## 2022-11-10 MED ORDER — CEFTRIAXONE SODIUM 1 G IJ SOLR
1.0000 g | Freq: Once | INTRAMUSCULAR | Status: AC
  Administered 2022-11-10: 1 g via INTRAMUSCULAR

## 2022-11-10 MED ORDER — CEFTRIAXONE SODIUM 1 G IJ SOLR
INTRAMUSCULAR | Status: AC
  Filled 2022-11-10: qty 10

## 2022-11-10 NOTE — ED Provider Notes (Addendum)
[facility]    CSN: [id number] Arrival date & time: 11/10/22  0809      History   Chief Complaint Chief Complaint  Patient presents with   Headache   Cough   Nasal Congestion   Fever   Generalized Body Aches    HPI Claire Caldwell is a 37 y.o. female who presents with onset of nose congestion 4 days ago. Then yesterday developed body aches, subjective fever, and cough with wheezing. She smokes on occasion. She took some left over rx cough medication yesterday. Her cough is productive at times, but mostly she feels post nasal drainage. She had COVID 2 months ago. She had a negative COVID test yesterday.     Past Medical History:  Diagnosis Date   Arthritis    lower back   Fibromyalgia    GERD (gastroesophageal reflux disease)    prilosec not reordered-  not taking x approx 3 weeks   Headache(784.0)    migraines   Hematuria    chronic   Hidradenitis 10/2011   right groin   Hypertension    Simple cyst of kidney    states normal kidney function, except for hematuria    Patient Active Problem List   Diagnosis Date Noted   Pyelonephritis 06/09/2021   Sepsis secondary to UTI (HCC) 06/08/2021   Essential hypertension 06/08/2021   Hematuria of undiagnosed cause 06/08/2021   Hyperglycemia 06/08/2021   Postop check 12/20/2012   Fibromyalgia 10/14/2012   Female genital symptoms 10/14/2012   Hidradenitis suppurativa 01/25/2012   Anal fissure 01/15/2012   Disorder of sacrum 04/16/2011   Lumbago 03/23/2011   Trochanteric bursitis of left hip 03/23/2011   Irregular menstrual cycle 03/13/2011   Hidradenitis-bil groins 09/16/2010   Encounter for sterilization 09/05/2010   DEPRESSION 11/22/2009   ACID REFLUX DISEASE 11/22/2009   SYNCOPE AND COLLAPSE 11/22/2009   DIZZINESS 11/22/2009   PALPITATIONS 11/22/2009   CARDIAC MURMUR 11/22/2009   Nervousness 11/22/2009    Past Surgical History:  Procedure Laterality Date   DILATION AND CURETTAGE OF UTERUS   03/13/2011   EAR CYST EXCISION  2007   bil ears   HYDRADENITIS EXCISION  10/30/2011   Procedure: EXCISION HYDRADENITIS GROIN;  Surgeon: Wilmon Arms. Corliss Skains, MD;  Location: [facility];  Service: General;  Laterality: Bilateral;   HYSTEROSCOPY W/ ENDOMETRIAL ABLATION  03/13/2011   INGUINAL HIDRADENITIS EXCISION  09/30/2010   bilateral   IRRIGATION AND DEBRIDEMENT SEBACEOUS CYST  03/01/2009   exc. sebaceous cyst x 4 bilateral groin   LAPAROSCOPY N/A 10/28/2012   Procedure: LAPAROSCOPY DIAGNOSTIC; PERIOTONEAL BIOPSIES;  Surgeon: Antionette Char, MD;  Location: [facility];  Service: Gynecology;  Laterality: N/A;   SALPINGECTOMY  08/25/2006   laparoscopic - right   TONSILLECTOMY     & adenoids as a child   TUBAL LIGATION  09/05/2010   Procedure: ESSURE TUBAL STERILIZATION;  Surgeon: Roseanna Rainbow, MD;  Location: [facility];  Service: Gynecology;  Laterality: Left;   TUMOR REMOVAL  2009   left knee   TUMOR REMOVAL  2010   left foot   TUMOR REMOVAL  2011   right salivary gland   TUMOR REMOVAL  2011   left side of head    OB History     Gravida  5   Para  3   Term  3   Preterm  0   AB  2   Living  3      SAB  1  IAB  0   Ectopic  1   Multiple  0   Live Births  3            Home Medications    Prior to Admission medications   Medication Sig Start Date End Date Taking? Authorizing Provider  amLODipine (NORVASC) 10 MG tablet Take 10 mg by mouth daily. 05/21/21  Yes [provider]  butalbital-acetaminophen-caffeine (FIORICET) 50-325-40 MG tablet Take 2 tablets by mouth every 4 (four) hours as needed for headache. 06/13/21  Yes Dorcas Carrow, MD  [facility] MELATONIN 5 MG TABS Take 5 mg by mouth every evening. 05/24/21  Yes [provider]  doxycycline (VIBRAMYCIN) 100 MG capsule Take 1 capsule (100 mg total) by mouth 2 (two) times daily. 11/10/22  Yes Rodriguez-Southworth, Nettie Elm, PA-C  gabapentin (NEURONTIN) 100 MG capsule Take 100 mg by mouth 2  (two) times daily. 05/21/21  Yes [provider]  promethazine-phenylephrine (PROMETHAZINE VC) 6.25-5 MG/5ML SYRP Take 5 mLs by mouth every 4 (four) hours as needed for congestion. 11/10/22  Yes Rodriguez-Southworth, Nettie Elm, PA-C  sertraline (ZOLOFT) 100 MG tablet Take 100 mg by mouth every evening. 07/19/19  Yes [provider]  ibuprofen (ADVIL) 100 MG/5ML suspension Take 300-400 mg by mouth every 8 (eight) hours as needed for mild pain (pain).    [provider]  Vitamin D, Ergocalciferol, (DRISDOL) 1.25 MG (50000 UNIT) CAPS capsule Take 50,000 Units by mouth every Wednesday. 05/21/21   [provider]  zolpidem (AMBIEN) 10 MG tablet Take 10 mg by mouth at bedtime as needed for sleep. 08/01/19   [provider]    Family History Family History  Problem Relation Age of Onset   Endometriosis Mother    Cancer Maternal Aunt        breast   Endometriosis Maternal Aunt    Cancer Maternal Uncle        lung   Endometriosis Maternal Grandmother     Social History Social History   Tobacco Use   Smoking status: Every Day    Current packs/day: 0.50    Average packs/day: 0.5 packs/day for 8.0 years (4.0 ttl pk-yrs)    Types: Cigarettes   Smokeless tobacco: Never  Vaping Use   Vaping status: Never Used  Substance Use Topics   Alcohol use: No   Drug use: No     Allergies   Mobic [meloxicam], Motrin [ibuprofen], Nsaids, and Ultram [tramadol]   Review of Systems Review of Systems  Constitutional:  Positive for chills, fatigue and fever. Negative for activity change and diaphoresis.  HENT:  Positive for congestion, postnasal drip and sore throat. Negative for ear discharge and ear pain.   Eyes:  Negative for discharge.  Respiratory:  Positive for cough, shortness of breath and wheezing. Negative for chest tightness.   Cardiovascular:  Negative for chest pain.  Musculoskeletal:  Positive for myalgias. Negative for gait problem, neck pain and  neck stiffness.  Skin:  Negative for rash and wound.  Neurological:  Negative for headaches.  Hematological:  Positive for adenopathy.     Physical Exam Triage Vital Signs ED Triage Vitals  Encounter Vitals Group     BP 11/10/22 0902 (!) 150/90     Systolic BP Percentile --      Diastolic BP Percentile --      Pulse Rate 11/10/22 0902 60     Resp 11/10/22 0902 16     Temp 11/10/22 0902 98.4 F (36.9 C)     Temp  Source 11/10/22 0902 Oral     SpO2 11/10/22 0902 96 %     Weight --      Height --      Head Circumference --      Peak Flow --      Pain Score 11/10/22 0859 8     Pain Loc --      Pain Education --      Exclude from Growth Chart --    No data found.  Updated Vital Signs BP (!) 150/90 (BP Location: Right Arm)   Pulse 60   Temp 98.4 F (36.9 C) (Oral)   Resp 16   SpO2 96%   Visual Acuity Right Eye Distance:   Left Eye Distance:   Bilateral Distance:    Right Eye Near:   Left Eye Near:    Bilateral Near:     Physical Exam Vitals reviewed.  Constitutional:      General: She is not in acute distress.    Appearance: She is ill-appearing. She is not toxic-appearing or diaphoretic.  HENT:     Right Ear: Tympanic membrane and ear canal normal.     Left Ear: Tympanic membrane, ear canal and external ear normal.     Ears:     Comments: R ear lobe is tender and swollen, has erythematous induration on the back of it.     Mouth/Throat:     Mouth: Mucous membranes are moist.     Dentition: No gingival swelling.     Tongue: No lesions. Tongue does not deviate from midline.     Pharynx: Oropharynx is clear. Uvula midline. Postnasal drip present. No oropharyngeal exudate or uvula swelling.     Tonsils: No tonsillar exudate or tonsillar abscesses. 1+ on the right. 1+ on the left.  Eyes:     General: No scleral icterus.       Right eye: No discharge.        Left eye: No discharge.     Extraocular Movements: Extraocular movements intact.     Conjunctiva/sclera:  Conjunctivae normal.  Neck:     Comments: Has a large tender R upper cervical chain node that is tender and I believe is from the abscess she has from her ear lobe.  Cardiovascular:     Rate and Rhythm: Normal rate and regular rhythm.     Heart sounds: No murmur heard. Pulmonary:     Effort: Pulmonary effort is normal.     Breath sounds: Normal breath sounds.  Musculoskeletal:        General: Normal range of motion.     Cervical back: Neck supple.  Lymphadenopathy:     Cervical: Cervical adenopathy present.  Skin:    General: Skin is warm and dry.  Neurological:     Mental Status: She is alert and oriented to person, place, and time.     Gait: Gait normal.  Psychiatric:        Mood and Affect: Mood normal.        Behavior: Behavior normal.        Thought Content: Thought content normal.        Judgment: Judgment normal.      UC Treatments / Results  Labs (all labs ordered are listed, but only abnormal results are displayed) Labs Reviewed  POCT INFLUENZA A/B  Flu A & B negative  EKG   Radiology DG Chest 2 View  Result Date: 11/10/2022 CLINICAL DATA:  Cough, body aches EXAM: CHEST - 2  VIEW COMPARISON:  06/08/2021 FINDINGS: Cardiac and mediastinal contours are within normal limits. No focal pulmonary opacity. No pleural effusion or pneumothorax. No acute osseous abnormality. IMPRESSION: No acute cardiopulmonary process. Electronically Signed   By: Wiliam Ke M.D.   On: 11/10/2022 11:27    Procedures Procedures (including critical care time)  Medications Ordered in UC Medications  cefTRIAXone (ROCEPHIN) injection 1 g (1 g Intramuscular Given 11/10/22 1055)    Initial Impression / Assessment and Plan / UC Course  I have reviewed the triage vital signs and the nursing notes.  Pertinent imaging results that were available during my care of the patient were reviewed by me and considered in my medical decision making (see chart for details).  Acute bronchitis Abscess  R ear lobe  She was given Rocephin 1 G IM here and placed on Doxycycline and phenergan VC as noted. See instructions.    Final Clinical Impressions(s) / UC Diagnoses   Final diagnoses:  Abscess  Acute bronchitis, unspecified organism     Discharge Instructions      Apply heat to the abscess area for 20 minutes 3-4 times a day, if this gets larger it may be ready to be drained, so come back.       ED Prescriptions     Medication Sig Dispense Auth. Provider   doxycycline (VIBRAMYCIN) 100 MG capsule Take 1 capsule (100 mg total) by mouth 2 (two) times daily. 20 capsule Rodriguez-Southworth, Nettie Elm, PA-C   promethazine-phenylephrine (PROMETHAZINE VC) 6.25-5 MG/5ML SYRP Take 5 mLs by mouth every 4 (four) hours as needed for congestion. 180 mL Rodriguez-Southworth, Nettie Elm, PA-C      PDMP not reviewed this encounter.   Garey Ham, PA-C 11/10/22 1206    Rodriguez-Southworth, [place], PA-C 11/10/22 1333

## 2022-11-10 NOTE — ED Triage Notes (Signed)
Pt states that she has headache, body aches, fever, cough, congestion since yesterday. She has been taking her migraine meds (7am) and OTC meds without relief. She found an old rx of doxy and has been taking it.

## 2022-11-10 NOTE — Discharge Instructions (Signed)
Apply heat to the abscess area for 20 minutes 3-4 times a day, if this gets larger it may be ready to be drained, so come back.

## 2022-11-16 DIAGNOSIS — J209 Acute bronchitis, unspecified: Secondary | ICD-10-CM | POA: Diagnosis not present

## 2022-11-16 DIAGNOSIS — N760 Acute vaginitis: Secondary | ICD-10-CM | POA: Diagnosis not present

## 2022-11-16 DIAGNOSIS — G43909 Migraine, unspecified, not intractable, without status migrainosus: Secondary | ICD-10-CM | POA: Diagnosis not present

## 2022-11-16 DIAGNOSIS — I10 Essential (primary) hypertension: Secondary | ICD-10-CM | POA: Diagnosis not present

## 2022-11-24 DIAGNOSIS — G43909 Migraine, unspecified, not intractable, without status migrainosus: Secondary | ICD-10-CM | POA: Diagnosis not present

## 2022-11-24 DIAGNOSIS — F419 Anxiety disorder, unspecified: Secondary | ICD-10-CM | POA: Diagnosis not present

## 2022-11-24 DIAGNOSIS — M609 Myositis, unspecified: Secondary | ICD-10-CM | POA: Diagnosis not present

## 2022-11-24 DIAGNOSIS — I10 Essential (primary) hypertension: Secondary | ICD-10-CM | POA: Diagnosis not present

## 2022-11-24 DIAGNOSIS — J302 Other seasonal allergic rhinitis: Secondary | ICD-10-CM | POA: Diagnosis not present

## 2022-12-06 DIAGNOSIS — Z419 Encounter for procedure for purposes other than remedying health state, unspecified: Secondary | ICD-10-CM | POA: Diagnosis not present

## 2023-01-06 DIAGNOSIS — Z419 Encounter for procedure for purposes other than remedying health state, unspecified: Secondary | ICD-10-CM | POA: Diagnosis not present

## 2023-02-06 DIAGNOSIS — Z419 Encounter for procedure for purposes other than remedying health state, unspecified: Secondary | ICD-10-CM | POA: Diagnosis not present

## 2023-03-06 DIAGNOSIS — Z419 Encounter for procedure for purposes other than remedying health state, unspecified: Secondary | ICD-10-CM | POA: Diagnosis not present

## 2023-04-14 DIAGNOSIS — J302 Other seasonal allergic rhinitis: Secondary | ICD-10-CM | POA: Diagnosis not present

## 2023-04-14 DIAGNOSIS — K219 Gastro-esophageal reflux disease without esophagitis: Secondary | ICD-10-CM | POA: Diagnosis not present

## 2023-04-14 DIAGNOSIS — M5430 Sciatica, unspecified side: Secondary | ICD-10-CM | POA: Diagnosis not present

## 2023-04-14 DIAGNOSIS — I10 Essential (primary) hypertension: Secondary | ICD-10-CM | POA: Diagnosis not present

## 2023-04-14 DIAGNOSIS — F419 Anxiety disorder, unspecified: Secondary | ICD-10-CM | POA: Diagnosis not present

## 2023-04-17 DIAGNOSIS — Z419 Encounter for procedure for purposes other than remedying health state, unspecified: Secondary | ICD-10-CM | POA: Diagnosis not present

## 2023-04-28 DIAGNOSIS — M609 Myositis, unspecified: Secondary | ICD-10-CM | POA: Diagnosis not present

## 2023-04-28 DIAGNOSIS — F419 Anxiety disorder, unspecified: Secondary | ICD-10-CM | POA: Diagnosis not present

## 2023-04-28 DIAGNOSIS — G43909 Migraine, unspecified, not intractable, without status migrainosus: Secondary | ICD-10-CM | POA: Diagnosis not present

## 2023-04-28 DIAGNOSIS — I10 Essential (primary) hypertension: Secondary | ICD-10-CM | POA: Diagnosis not present

## 2023-05-17 DIAGNOSIS — Z419 Encounter for procedure for purposes other than remedying health state, unspecified: Secondary | ICD-10-CM | POA: Diagnosis not present

## 2023-05-26 DIAGNOSIS — F419 Anxiety disorder, unspecified: Secondary | ICD-10-CM | POA: Diagnosis not present

## 2023-05-26 DIAGNOSIS — M609 Myositis, unspecified: Secondary | ICD-10-CM | POA: Diagnosis not present

## 2023-05-26 DIAGNOSIS — G43909 Migraine, unspecified, not intractable, without status migrainosus: Secondary | ICD-10-CM | POA: Diagnosis not present

## 2023-06-17 DIAGNOSIS — Z419 Encounter for procedure for purposes other than remedying health state, unspecified: Secondary | ICD-10-CM | POA: Diagnosis not present

## 2023-07-17 DIAGNOSIS — Z419 Encounter for procedure for purposes other than remedying health state, unspecified: Secondary | ICD-10-CM | POA: Diagnosis not present

## 2023-08-17 DIAGNOSIS — Z419 Encounter for procedure for purposes other than remedying health state, unspecified: Secondary | ICD-10-CM | POA: Diagnosis not present

## 2023-08-23 DIAGNOSIS — I10 Essential (primary) hypertension: Secondary | ICD-10-CM | POA: Diagnosis not present

## 2023-08-23 DIAGNOSIS — Z131 Encounter for screening for diabetes mellitus: Secondary | ICD-10-CM | POA: Diagnosis not present

## 2023-08-23 DIAGNOSIS — K219 Gastro-esophageal reflux disease without esophagitis: Secondary | ICD-10-CM | POA: Diagnosis not present

## 2023-08-23 DIAGNOSIS — E7849 Other hyperlipidemia: Secondary | ICD-10-CM | POA: Diagnosis not present

## 2023-08-23 DIAGNOSIS — G43909 Migraine, unspecified, not intractable, without status migrainosus: Secondary | ICD-10-CM | POA: Diagnosis not present

## 2023-08-23 DIAGNOSIS — M5432 Sciatica, left side: Secondary | ICD-10-CM | POA: Diagnosis not present

## 2023-08-23 DIAGNOSIS — E559 Vitamin D deficiency, unspecified: Secondary | ICD-10-CM | POA: Diagnosis not present

## 2023-08-23 DIAGNOSIS — Z0000 Encounter for general adult medical examination without abnormal findings: Secondary | ICD-10-CM | POA: Diagnosis not present

## 2023-09-08 DIAGNOSIS — G43909 Migraine, unspecified, not intractable, without status migrainosus: Secondary | ICD-10-CM | POA: Diagnosis not present

## 2023-09-08 DIAGNOSIS — F419 Anxiety disorder, unspecified: Secondary | ICD-10-CM | POA: Diagnosis not present

## 2023-09-08 DIAGNOSIS — M609 Myositis, unspecified: Secondary | ICD-10-CM | POA: Diagnosis not present

## 2023-09-08 DIAGNOSIS — I10 Essential (primary) hypertension: Secondary | ICD-10-CM | POA: Diagnosis not present

## 2023-09-17 DIAGNOSIS — Z419 Encounter for procedure for purposes other than remedying health state, unspecified: Secondary | ICD-10-CM | POA: Diagnosis not present

## 2023-10-18 DIAGNOSIS — G43909 Migraine, unspecified, not intractable, without status migrainosus: Secondary | ICD-10-CM | POA: Diagnosis not present

## 2023-10-18 DIAGNOSIS — J069 Acute upper respiratory infection, unspecified: Secondary | ICD-10-CM | POA: Diagnosis not present

## 2023-10-18 DIAGNOSIS — J302 Other seasonal allergic rhinitis: Secondary | ICD-10-CM | POA: Diagnosis not present

## 2023-10-18 DIAGNOSIS — I10 Essential (primary) hypertension: Secondary | ICD-10-CM | POA: Diagnosis not present

## 2023-11-01 DIAGNOSIS — J069 Acute upper respiratory infection, unspecified: Secondary | ICD-10-CM | POA: Diagnosis not present

## 2023-11-10 DIAGNOSIS — I10 Essential (primary) hypertension: Secondary | ICD-10-CM | POA: Diagnosis not present

## 2023-11-10 DIAGNOSIS — F419 Anxiety disorder, unspecified: Secondary | ICD-10-CM | POA: Diagnosis not present

## 2023-11-10 DIAGNOSIS — J302 Other seasonal allergic rhinitis: Secondary | ICD-10-CM | POA: Diagnosis not present

## 2023-11-10 DIAGNOSIS — M609 Myositis, unspecified: Secondary | ICD-10-CM | POA: Diagnosis not present

## 2023-11-17 DIAGNOSIS — Z419 Encounter for procedure for purposes other than remedying health state, unspecified: Secondary | ICD-10-CM | POA: Diagnosis not present

## 2023-11-22 DIAGNOSIS — I10 Essential (primary) hypertension: Secondary | ICD-10-CM | POA: Diagnosis not present

## 2023-11-22 DIAGNOSIS — M5430 Sciatica, unspecified side: Secondary | ICD-10-CM | POA: Diagnosis not present

## 2023-11-22 DIAGNOSIS — M609 Myositis, unspecified: Secondary | ICD-10-CM | POA: Diagnosis not present

## 2023-11-22 DIAGNOSIS — G43909 Migraine, unspecified, not intractable, without status migrainosus: Secondary | ICD-10-CM | POA: Diagnosis not present

## 2023-11-22 DIAGNOSIS — K219 Gastro-esophageal reflux disease without esophagitis: Secondary | ICD-10-CM | POA: Diagnosis not present

## 2023-12-06 DIAGNOSIS — M609 Myositis, unspecified: Secondary | ICD-10-CM | POA: Diagnosis not present

## 2023-12-06 DIAGNOSIS — G43909 Migraine, unspecified, not intractable, without status migrainosus: Secondary | ICD-10-CM | POA: Diagnosis not present

## 2023-12-06 DIAGNOSIS — I10 Essential (primary) hypertension: Secondary | ICD-10-CM | POA: Diagnosis not present

## 2023-12-06 DIAGNOSIS — F419 Anxiety disorder, unspecified: Secondary | ICD-10-CM | POA: Diagnosis not present
